# Patient Record
Sex: Female | Born: 1985 | Race: Black or African American | Hispanic: No | Marital: Married | Smoking: Current every day smoker
Health system: Southern US, Community
[De-identification: ages and names within clinical notes are randomized; demographics above are authoritative.]

## PROBLEM LIST (undated history)

## (undated) DIAGNOSIS — G809 Cerebral palsy, unspecified: Secondary | ICD-10-CM

## (undated) HISTORY — PX: OTHER SURGICAL HISTORY: SHX169

---
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
## 2004-08-23 ENCOUNTER — Emergency Department (HOSPITAL_COMMUNITY): Admission: EM | Admit: 2004-08-23 | Discharge: 2004-08-24 | Payer: Self-pay | Admitting: Emergency Medicine

## 2005-01-02 ENCOUNTER — Emergency Department (HOSPITAL_COMMUNITY): Admission: EM | Admit: 2005-01-02 | Discharge: 2005-01-02 | Payer: Self-pay | Admitting: Emergency Medicine

## 2005-03-22 ENCOUNTER — Ambulatory Visit: Payer: Self-pay | Admitting: Family Medicine

## 2005-04-22 ENCOUNTER — Ambulatory Visit (HOSPITAL_COMMUNITY): Admission: AD | Admit: 2005-04-22 | Discharge: 2005-04-22 | Payer: Self-pay | Admitting: Obstetrics & Gynecology

## 2005-04-22 ENCOUNTER — Ambulatory Visit: Payer: Self-pay | Admitting: Obstetrics & Gynecology

## 2005-04-22 ENCOUNTER — Encounter (INDEPENDENT_AMBULATORY_CARE_PROVIDER_SITE_OTHER): Payer: Self-pay | Admitting: Specialist

## 2005-04-24 ENCOUNTER — Inpatient Hospital Stay (HOSPITAL_COMMUNITY): Admission: AD | Admit: 2005-04-24 | Discharge: 2005-04-24 | Payer: Self-pay | Admitting: Obstetrics and Gynecology

## 2006-08-13 ENCOUNTER — Inpatient Hospital Stay (HOSPITAL_COMMUNITY): Admission: EM | Admit: 2006-08-13 | Discharge: 2006-08-18 | Payer: Self-pay | Admitting: Emergency Medicine

## 2006-08-13 ENCOUNTER — Ambulatory Visit: Payer: Self-pay | Admitting: Family Medicine

## 2006-10-04 ENCOUNTER — Inpatient Hospital Stay (HOSPITAL_COMMUNITY): Admission: AD | Admit: 2006-10-04 | Discharge: 2006-10-04 | Payer: Self-pay | Admitting: Obstetrics & Gynecology

## 2007-02-10 ENCOUNTER — Inpatient Hospital Stay (HOSPITAL_COMMUNITY): Admission: AD | Admit: 2007-02-10 | Discharge: 2007-02-10 | Payer: Self-pay | Admitting: Obstetrics and Gynecology

## 2007-04-06 ENCOUNTER — Emergency Department (HOSPITAL_COMMUNITY): Admission: EM | Admit: 2007-04-06 | Discharge: 2007-04-06 | Payer: Self-pay | Admitting: Family Medicine

## 2007-04-22 ENCOUNTER — Emergency Department (HOSPITAL_COMMUNITY): Admission: EM | Admit: 2007-04-22 | Discharge: 2007-04-22 | Payer: Self-pay | Admitting: Podiatry

## 2007-09-24 ENCOUNTER — Emergency Department (HOSPITAL_COMMUNITY): Admission: EM | Admit: 2007-09-24 | Discharge: 2007-09-24 | Payer: Self-pay | Admitting: Emergency Medicine

## 2007-11-03 ENCOUNTER — Emergency Department (HOSPITAL_COMMUNITY): Admission: EM | Admit: 2007-11-03 | Discharge: 2007-11-03 | Payer: Self-pay | Admitting: Emergency Medicine

## 2007-11-29 ENCOUNTER — Emergency Department (HOSPITAL_COMMUNITY): Admission: EM | Admit: 2007-11-29 | Discharge: 2007-11-29 | Payer: Self-pay | Admitting: Emergency Medicine

## 2008-07-04 ENCOUNTER — Emergency Department (HOSPITAL_COMMUNITY): Admission: EM | Admit: 2008-07-04 | Discharge: 2008-07-04 | Payer: Self-pay | Admitting: Emergency Medicine

## 2008-07-19 ENCOUNTER — Emergency Department (HOSPITAL_COMMUNITY): Admission: EM | Admit: 2008-07-19 | Discharge: 2008-07-19 | Payer: Self-pay | Admitting: Emergency Medicine

## 2008-10-03 ENCOUNTER — Emergency Department (HOSPITAL_COMMUNITY): Admission: EM | Admit: 2008-10-03 | Discharge: 2008-10-03 | Payer: Self-pay | Admitting: Emergency Medicine

## 2008-10-31 ENCOUNTER — Emergency Department (HOSPITAL_COMMUNITY): Admission: EM | Admit: 2008-10-31 | Discharge: 2008-10-31 | Payer: Self-pay | Admitting: Emergency Medicine

## 2009-03-13 ENCOUNTER — Emergency Department (HOSPITAL_COMMUNITY): Admission: EM | Admit: 2009-03-13 | Discharge: 2009-03-13 | Payer: Self-pay | Admitting: Emergency Medicine

## 2009-08-01 ENCOUNTER — Inpatient Hospital Stay (HOSPITAL_COMMUNITY): Admission: AD | Admit: 2009-08-01 | Discharge: 2009-08-01 | Payer: Self-pay | Admitting: Family Medicine

## 2009-08-02 ENCOUNTER — Inpatient Hospital Stay (HOSPITAL_COMMUNITY): Admission: AD | Admit: 2009-08-02 | Discharge: 2009-08-02 | Payer: Self-pay | Admitting: Obstetrics and Gynecology

## 2009-08-09 ENCOUNTER — Inpatient Hospital Stay (HOSPITAL_COMMUNITY): Admission: AD | Admit: 2009-08-09 | Discharge: 2009-08-09 | Payer: Self-pay | Admitting: Obstetrics & Gynecology

## 2009-10-09 ENCOUNTER — Ambulatory Visit (HOSPITAL_COMMUNITY): Admission: RE | Admit: 2009-10-09 | Discharge: 2009-10-09 | Payer: Self-pay | Admitting: Obstetrics

## 2010-02-07 ENCOUNTER — Inpatient Hospital Stay (HOSPITAL_COMMUNITY): Admission: AD | Admit: 2010-02-07 | Discharge: 2010-02-07 | Payer: Self-pay | Admitting: Obstetrics & Gynecology

## 2010-02-13 ENCOUNTER — Inpatient Hospital Stay (HOSPITAL_COMMUNITY): Admission: AD | Admit: 2010-02-13 | Discharge: 2010-02-14 | Payer: Self-pay | Admitting: Obstetrics & Gynecology

## 2010-02-15 ENCOUNTER — Encounter: Payer: Self-pay | Admitting: Obstetrics & Gynecology

## 2010-02-15 ENCOUNTER — Inpatient Hospital Stay (HOSPITAL_COMMUNITY): Admission: AD | Admit: 2010-02-15 | Discharge: 2010-02-18 | Payer: Self-pay | Admitting: Obstetrics & Gynecology

## 2010-03-25 ENCOUNTER — Emergency Department (HOSPITAL_COMMUNITY): Admission: EM | Admit: 2010-03-25 | Discharge: 2010-03-25 | Payer: Self-pay | Admitting: Family Medicine

## 2010-03-25 ENCOUNTER — Emergency Department (HOSPITAL_COMMUNITY): Admission: EM | Admit: 2010-03-25 | Discharge: 2010-03-26 | Payer: Self-pay | Admitting: Emergency Medicine

## 2010-03-27 ENCOUNTER — Inpatient Hospital Stay (HOSPITAL_COMMUNITY): Admission: AD | Admit: 2010-03-27 | Discharge: 2010-03-31 | Payer: Self-pay | Admitting: Obstetrics

## 2011-01-09 ENCOUNTER — Emergency Department (HOSPITAL_COMMUNITY)
Admission: EM | Admit: 2011-01-09 | Discharge: 2011-01-09 | Payer: Self-pay | Source: Home / Self Care | Admitting: Emergency Medicine

## 2011-01-09 LAB — POCT PREGNANCY, URINE: Preg Test, Ur: NEGATIVE

## 2011-03-06 LAB — COMPREHENSIVE METABOLIC PANEL
Albumin: 3.7 g/dL (ref 3.5–5.2)
Alkaline Phosphatase: 68 U/L (ref 39–117)
BUN: 12 mg/dL (ref 6–23)
BUN: 12 mg/dL (ref 6–23)
CO2: 22 mEq/L (ref 19–32)
Calcium: 8.8 mg/dL (ref 8.4–10.5)
Chloride: 107 mEq/L (ref 96–112)
Creatinine, Ser: 0.54 mg/dL (ref 0.4–1.2)
Creatinine, Ser: 0.73 mg/dL (ref 0.4–1.2)
GFR calc non Af Amer: >60 mL/min (ref >60)
Glucose, Bld: 101 mg/dL — ABNORMAL HIGH (ref 70–99)
Potassium: 2.8 mEq/L — ABNORMAL LOW (ref 3.5–5.1)
Potassium: 3.3 mEq/L — ABNORMAL LOW (ref 3.5–5.1)
Total Bilirubin: 0.7 mg/dL (ref 0.3–1.2)
Total Protein: 6.8 g/dL (ref 6.0–8.3)

## 2011-03-06 LAB — CBC
HCT: 34.2 % — ABNORMAL LOW (ref 36.0–46.0)
HCT: 35.8 % — ABNORMAL LOW (ref 36.0–46.0)
Hemoglobin: 11.2 g/dL — ABNORMAL LOW (ref 12.0–15.0)
Hemoglobin: 11.3 g/dL — ABNORMAL LOW (ref 12.0–15.0)
MCHC: 32.7 g/dL (ref 30.0–36.0)
MCHC: 32.5 g/dL (ref 30.0–36.0)
MCV: 93.7 fL (ref 78.0–100.0)
MCV: 94.1 fL (ref 78.0–100.0)
MCV: 94.3 fL (ref 78.0–100.0)
MCV: 96.4 fL (ref 78.0–100.0)
Platelets: 152 10*3/uL (ref 150–400)
Platelets: 172 10*3/uL (ref 150–400)
Platelets: 183 10*3/uL (ref 150–400)
RBC: 3.32 MIL/uL — ABNORMAL LOW (ref 3.87–5.11)
RBC: 3.61 MIL/uL — ABNORMAL LOW (ref 3.87–5.11)
RDW: 12.5 % (ref 11.5–15.5)
WBC: 22.1 10*3/uL — ABNORMAL HIGH (ref 4.0–10.5)
WBC: 8 10*3/uL (ref 4.0–10.5)
WBC: 8.4 10*3/uL (ref 4.0–10.5)

## 2011-03-06 LAB — DIFFERENTIAL
Basophils Relative: 0 % (ref 0–1)
Basophils Relative: 0 % (ref 0–1)
Eosinophils Absolute: 0.2 10*3/uL (ref 0.0–0.7)
Eosinophils Relative: 0 % (ref 0–5)
Lymphocytes Relative: 4 % — ABNORMAL LOW (ref 12–46)
Lymphs Abs: 0.3 10*3/uL — ABNORMAL LOW (ref 0.7–4.0)
Lymphs Abs: 0.7 10*3/uL (ref 0.7–4.0)
Lymphs Abs: 1.7 10*3/uL (ref 0.7–4.0)
Monocytes Absolute: 0 10*3/uL — ABNORMAL LOW (ref 0.1–1.0)
Monocytes Absolute: 0.4 10*3/uL (ref 0.1–1.0)
Monocytes Relative: 1 % — ABNORMAL LOW (ref 3–12)
Monocytes Relative: 2 % — ABNORMAL LOW (ref 3–12)
Monocytes Relative: 8 % (ref 3–12)
Neutro Abs: 21 10*3/uL — ABNORMAL HIGH (ref 1.7–7.7)
Neutro Abs: 5.4 10*3/uL (ref 1.7–7.7)
Neutro Abs: 7.3 10*3/uL (ref 1.7–7.7)
Neutrophils Relative %: 67 % (ref 43–77)

## 2011-03-06 LAB — URINALYSIS, ROUTINE W REFLEX MICROSCOPIC
Glucose, UA: NEGATIVE mg/dL
Specific Gravity, Urine: 1.029 (ref 1.005–1.030)
Urobilinogen, UA: 1 mg/dL (ref 0.0–1.0)

## 2011-03-06 LAB — WET PREP, GENITAL

## 2011-03-06 LAB — URINE MICROSCOPIC-ADD ON

## 2011-03-06 LAB — RPR: RPR Ser Ql: NONREACTIVE

## 2011-03-06 LAB — POCT PREGNANCY, URINE: Preg Test, Ur: NEGATIVE

## 2011-03-10 LAB — CBC
HCT: 29.7 % — ABNORMAL LOW (ref 36.0–46.0)
HCT: 33.6 % — ABNORMAL LOW (ref 36.0–46.0)
Hemoglobin: 11 g/dL — ABNORMAL LOW (ref 12.0–15.0)
Hemoglobin: 11.3 g/dL — ABNORMAL LOW (ref 12.0–15.0)
MCHC: 32.8 g/dL (ref 30.0–36.0)
MCHC: 33.3 g/dL (ref 30.0–36.0)
MCV: 95.8 fL (ref 78.0–100.0)
MCV: 95.9 fL (ref 78.0–100.0)
MCV: 97.2 fL (ref 78.0–100.0)
Platelets: 103 10*3/uL — ABNORMAL LOW (ref 150–400)
RBC: 3.55 MIL/uL — ABNORMAL LOW (ref 3.87–5.11)
RDW: 13.7 % (ref 11.5–15.5)
RDW: 13.8 % (ref 11.5–15.5)
WBC: 10.7 10*3/uL — ABNORMAL HIGH (ref 4.0–10.5)

## 2011-03-23 LAB — CBC
HCT: 34.8 % — ABNORMAL LOW (ref 36.0–46.0)
HCT: 34.8 % — ABNORMAL LOW (ref 36.0–46.0)
MCV: 93.2 fL (ref 78.0–100.0)
Platelets: 179 10*3/uL (ref 150–400)
Platelets: 193 10*3/uL (ref 150–400)
RBC: 3.74 MIL/uL — ABNORMAL LOW (ref 3.87–5.11)
RDW: 13 % (ref 11.5–15.5)
WBC: 6.4 10*3/uL (ref 4.0–10.5)
WBC: 7.9 10*3/uL (ref 4.0–10.5)

## 2011-03-23 LAB — COMPREHENSIVE METABOLIC PANEL
AST: 21 U/L (ref 0–37)
Albumin: 4 g/dL (ref 3.5–5.2)
Alkaline Phosphatase: 37 U/L — ABNORMAL LOW (ref 39–117)
BUN: 13 mg/dL (ref 6–23)
CO2: 23 mEq/L (ref 19–32)
Chloride: 105 mEq/L (ref 96–112)
Potassium: 4 mEq/L (ref 3.5–5.1)
Total Bilirubin: 0.4 mg/dL (ref 0.3–1.2)

## 2011-03-23 LAB — URINALYSIS, ROUTINE W REFLEX MICROSCOPIC
Bilirubin Urine: NEGATIVE
Glucose, UA: NEGATIVE mg/dL
Hgb urine dipstick: NEGATIVE
Hgb urine dipstick: NEGATIVE
Ketones, ur: 40 mg/dL — AB
Ketones, ur: >80 mg/dL — AB
Protein, ur: NEGATIVE mg/dL
Protein, ur: NEGATIVE mg/dL
Urobilinogen, UA: 1 mg/dL (ref 0.0–1.0)

## 2011-03-23 LAB — POCT PREGNANCY, URINE: Preg Test, Ur: POSITIVE

## 2011-03-23 LAB — URINE MICROSCOPIC-ADD ON

## 2011-03-23 LAB — URINE CULTURE: Colony Count: >=100000

## 2011-03-23 LAB — AMYLASE: Amylase: 131 U/L (ref 27–131)

## 2011-03-23 LAB — HCG, QUANTITATIVE, PREGNANCY: hCG, Beta Chain, Quant, S: 102764 m[IU]/mL — ABNORMAL HIGH (ref <5)

## 2011-05-03 NOTE — Discharge Summary (Signed)
NAMETIMERA, WINDT NO.:  [id number]   MEDICAL RECORD NO.:  [id number]          PATIENT TYPE:  INP   LOCATION:  5004                         FACILITY:  [facility]   PHYSICIAN:  Melina Fiddler, MD DATE OF BIRTH:  March 30, 1986   DATE OF ADMISSION:  08/13/2006  DATE OF DISCHARGE:                                 DISCHARGE SUMMARY   CONSULTS:  GYN.   DISCHARGE DIAGNOSES:  1. Gonorrheal pelvic inflammatory disease.  2. Anemia of blood loss.  3. Constipation.   PROCEDURES:  Ultrasound on August 13, 2006, showing complex fluid 5 x 4 x 10  cm collection in pelvis.  CT showed small amount of free fluid in the  pelvis.   IMPORTANT LABORATORY RESULTS:  White count 6.7 at discharge, up to 18.1  during the admission.  Hemoglobin 10.6 at discharge.  Creatinine 0.7 at  discharge.  CRP up to 207.3.  HIV nonreactive.  RPR nonreactive.  GC  positive.  Few clue cells on wet prep.  Urine pregnancy negative.   HOSPITAL COURSE:  A 25 year old [place] American female who presented with  abdominal pain and copious vaginal discharge, found to have gonorrheal PID.  There was a fluid collection found on ultrasound that was worrisome for an  abscess so a CT was performed and found that there was only a small mild  fluid not amenable to drainage.  We did consult OB/GYN who agreed there was  no need for surgical intervention.  She was treated with ceftriaxone  initially, followed by p.o. doxycycline and Flagyl.  She also had some clue  cells on her wet prep.  She was given a Depo-Provera shot in the hospital,  was instructed to follow up in 3 months for repeat.  Also, to wear condoms  and have her sexual partners tested for STDs.   DISCHARGE CONDITION:  Good.   DISCHARGE DIET:  Regular.   DISCHARGE ACTIVITY:  As tolerated.   FOLLOWUP:  The patient will call for followup with Dr. Sylvan Cheese at the  [facility].  As stated above, she is instructed to  have her  sexual partners tested for STDs.  She is also to schedule with the  [facility] for her next Depo-Provera shot.   DISCHARGE MEDICATIONS:  1. Flagyl 500 mg p.o. b.i.d. x14 days.  2. Doxycycline 100 mg p.o. b.i.d. x14 days.  3. Vicodin 5/500 one to two tablets by mouth every 6 hours as needed for      pain.  4. MiraLax as needed for constipation.      Angeline Slim, M.D.    ______________________________  Melina Fiddler, MD    AL/MEDQ  D:  08/18/2006  T:  08/18/2006  Job:  [id number]   cc:   [facility]  Sylvan Cheese, M.D.

## 2011-05-03 NOTE — Op Note (Signed)
NAME:  Gina Riley, Gina Riley NO.:  [id number]   MEDICAL RECORD NO.:  [id number]          PATIENT TYPE:  AMB   LOCATION:  [facility]                          FACILITY:  [facility]   PHYSICIAN:  Lesly Dukes, M.D. DATE OF BIRTH:  1986/10/21   DATE OF PROCEDURE:  04/22/2005  DATE OF DISCHARGE:                                 OPERATIVE REPORT   INDICATIONS FOR PROCEDURE:  The patient is a 25 year old G1, P0, with a beta  of approximately 10,000 and questionable incomplete AB versus left ectopic.  The patient's exam is benign and cervix is slightly effaced and dilated.  This is, most likely an incomplete.  We will proceed with D&C with frozen  section.  If frozen section is not consistent with products of conception,  will proceed with ectopic protocol.   PREOPERATIVE DIAGNOSIS:  25 year old G1, P0 female with probable incomplete  abortion.   POSTOPERATIVE DIAGNOSIS:  25 year old G1, P0 female with probable incomplete  abortion.   PROCEDURE:  Dilation and vacuum curettage with frozen section.   SURGEON:  Lesly Dukes, M.D.   ANESTHESIA:  MAC plus local.   SPECIMENS:  POC.   ESTIMATED BLOOD LOSS:  100 mL.   COMPLICATIONS:  None.   DESCRIPTION OF PROCEDURE:  After informed consent was obtained, the patient  was taken to the operating room where MAC anesthesia was found to be  adequate.  The patient was placed in the dorsal lithotomy position  and  prepped and draped in the normal sterile fashion.  The bladder was in and  out catheterized.  A bivalve speculum was placed into the vagina and the  cervix was brought into view.  The anterior lip of the cervix was grasped  with a single tooth tenaculum.  20 mL of 1% lidocaine were injected at 3 and  9 o'clock.  The cervix was gently dilated with Hegar dilators to a #9.  A #8  curved suction curet was gently introduced into the uterus and suction  curettage was performed.  The suction curet was removed and a sharp curet  was  introduced into the uterus.  Gentle sharp curettage was performed and a  good crie was felt on all four sides of the uterus.  The sharp curet was  removed and one last pass with the suction curet was done to insure that all  POC was removed from the uterus.  All instruments were removed from the  vagina.  The cervix was hemostatic.  The patient tolerated the procedure  well.  Sponge, lap, instrument, and needle counts were correct x 2.  The  patient went to the recovery room in stable condition.     KHL/MEDQ  D:  04/22/2005  T:  04/22/2005  Job:  [id number]

## 2011-07-09 ENCOUNTER — Emergency Department (HOSPITAL_COMMUNITY)
Admission: EM | Admit: 2011-07-09 | Discharge: 2011-07-09 | Discharge status: Against Medical Advice | Payer: Medicaid Other | Attending: Emergency Medicine | Admitting: Emergency Medicine

## 2011-07-09 DIAGNOSIS — Z0389 Encounter for observation for other suspected diseases and conditions ruled out: Secondary | ICD-10-CM | POA: Insufficient documentation

## 2011-09-24 LAB — POCT PREGNANCY, URINE: Operator id: 198171

## 2011-09-26 LAB — URINALYSIS, ROUTINE W REFLEX MICROSCOPIC
Bilirubin Urine: NEGATIVE
Glucose, UA: NEGATIVE
Hgb urine dipstick: NEGATIVE
Specific Gravity, Urine: 1.023
Urobilinogen, UA: 1

## 2011-09-26 LAB — URINE MICROSCOPIC-ADD ON

## 2011-09-26 LAB — RPR: RPR Ser Ql: NONREACTIVE

## 2012-04-27 DIAGNOSIS — M9420 Chondromalacia, unspecified site: Secondary | ICD-10-CM | POA: Diagnosis not present

## 2012-07-13 DIAGNOSIS — Z113 Encounter for screening for infections with a predominantly sexual mode of transmission: Secondary | ICD-10-CM | POA: Diagnosis not present

## 2012-07-13 DIAGNOSIS — Z3202 Encounter for pregnancy test, result negative: Secondary | ICD-10-CM | POA: Diagnosis not present

## 2012-07-13 DIAGNOSIS — N760 Acute vaginitis: Secondary | ICD-10-CM | POA: Diagnosis not present

## 2012-12-08 ENCOUNTER — Encounter (HOSPITAL_COMMUNITY): Payer: Self-pay | Admitting: *Deleted

## 2012-12-08 ENCOUNTER — Emergency Department (INDEPENDENT_AMBULATORY_CARE_PROVIDER_SITE_OTHER): Payer: Medicare Other

## 2012-12-08 ENCOUNTER — Emergency Department (INDEPENDENT_AMBULATORY_CARE_PROVIDER_SITE_OTHER)
Admission: EM | Admit: 2012-12-08 | Discharge: 2012-12-08 | Disposition: A | Discharge status: Home / Self Care | Payer: Medicare Other | Source: Home / Self Care | Attending: Emergency Medicine | Admitting: Emergency Medicine

## 2012-12-08 DIAGNOSIS — R8290 Unspecified abnormal findings in urine: Secondary | ICD-10-CM

## 2012-12-08 DIAGNOSIS — R109 Unspecified abdominal pain: Secondary | ICD-10-CM

## 2012-12-08 DIAGNOSIS — K5900 Constipation, unspecified: Secondary | ICD-10-CM | POA: Diagnosis not present

## 2012-12-08 DIAGNOSIS — R82998 Other abnormal findings in urine: Secondary | ICD-10-CM

## 2012-12-08 HISTORY — DX: Cerebral palsy, unspecified: G80.9

## 2012-12-08 LAB — POCT URINALYSIS DIP (DEVICE)
Glucose, UA: NEGATIVE mg/dL
Nitrite: POSITIVE — AB
Protein, ur: 30 mg/dL — AB
Urobilinogen, UA: 0.2 mg/dL (ref 0.0–1.0)

## 2012-12-08 LAB — POCT PREGNANCY, URINE: Preg Test, Ur: NEGATIVE

## 2012-12-08 MED ORDER — SULFAMETHOXAZOLE-TRIMETHOPRIM 800-160 MG PO TABS: 1.0000 | ORAL_TABLET | Freq: Two times a day (BID) | ORAL | Status: DC

## 2012-12-08 NOTE — ED Notes (Signed)
Pt  Reports  l  Side  abd       X  2  Weeks    Also late  On  Her  Period      denys  Any bleeding or  Discharge

## 2012-12-08 NOTE — ED Provider Notes (Signed)
History     CSN: [id number]  Arrival date & time 12/08/12  1444   First MD Initiated Contact with Patient 12/08/12 1524      Chief Complaint  Patient presents with  . Abdominal Cramping    (Consider location/radiation/quality/duration/timing/severity/associated sxs/prior treatment) HPI Comments: Patient presents urgent care describing for about 2 weeks she's been having discomfort and cramping pains in left side of her abdomen. (Patient points to mid abdominal region and suprapubic region), she denies any vaginal bleeding or discharge or urinary symptoms. She said that about a week ago she vomited 2x1 day. But that has subsided and she has not been nauseous since then or have had any other further episodes of vomiting. She denies any fevers, describes that she still has an appetite. But she has not had a bowel movement for about 5-6 days. She actually took an over-the-counter laxative for it did help some. Patient denies any respiratory symptoms, and she was also wondering if she's pregnant she has not seen her. Within the last 2 weeks she has been expecting her period.  Patient is a 26 y.o. female presenting with cramps. The history is provided by the patient.  Abdominal Cramping The primary symptoms of the illness include abdominal pain. The primary symptoms of the illness do not include fever, shortness of breath, nausea, diarrhea, dysuria, vaginal discharge or vaginal bleeding. The current episode started more than 2 days ago. The problem has not changed since onset. Additional symptoms associated with the illness include constipation. Symptoms associated with the illness do not include chills, anorexia, urgency, hematuria, frequency or back pain. Significant associated medical issues do not include inflammatory bowel disease, sickle cell disease, substance abuse or diverticulitis.    Past Medical History  Diagnosis Date  . Cerebral palsy     History reviewed. No pertinent past  surgical history.  No family history on file.  History  Substance Use Topics  . Smoking status: Current Every Day Smoker  . Smokeless tobacco: Not on file  . Alcohol Use: No    OB History    Grav Para Term Preterm Abortions TAB SAB Ect Mult Living                  Review of Systems  Constitutional: Negative for fever, chills, activity change and appetite change.  Respiratory: Negative for cough and shortness of breath.   Gastrointestinal: Positive for abdominal pain and constipation. Negative for nausea, diarrhea, blood in stool, abdominal distention, anal bleeding, rectal pain and anorexia.  Genitourinary: Negative for dysuria, urgency, frequency, hematuria, vaginal bleeding and vaginal discharge.  Musculoskeletal: Negative for back pain.  Skin: Negative for rash.  Neurological: Negative for dizziness, syncope and light-headedness.    Allergies  Codeine and Penicillins  Home Medications   Current Outpatient Rx  Name  Route  Sig  Dispense  Refill  . SULFAMETHOXAZOLE-TRIMETHOPRIM 800-160 MG PO TABS   Oral   Take 1 tablet by mouth 2 (two) times daily.   28 tablet   0     BP 118/68  Pulse 71  Temp 98.2 F (36.8 C) (Oral)  Resp 18  SpO2 100%  LMP 11/27/2012  Physical Exam  Nursing note and vitals reviewed. Constitutional: She appears well-developed and well-nourished. No distress.  HENT:  Head: Normocephalic.  Eyes: Conjunctivae normal are normal.  Neck: Neck supple.  Cardiovascular: Normal rate.  Exam reveals no gallop and no friction rub.   No murmur heard. Pulmonary/Chest: Effort normal and breath sounds normal.  Abdominal: Soft. Bowel sounds are normal. She exhibits no distension and no mass. There is no hepatosplenomegaly, splenomegaly or hepatomegaly. There is tenderness. There is no rigidity, no rebound, no guarding, no CVA tenderness, no tenderness at McBurney's point and negative Murphy's sign. No hernia. Hernia confirmed negative in the ventral area.      Lymphadenopathy:    She has no cervical adenopathy.  Skin: No erythema.    ED Course  Procedures (including critical care time)  Labs Reviewed  POCT URINALYSIS DIP (DEVICE) - Abnormal; Notable for the following:    Hgb urine dipstick TRACE (*)     Protein, ur 30 (*)     Nitrite POSITIVE (*)     Leukocytes, UA SMALL (*)  Biochemical Testing Only. Please order routine urinalysis from main lab if confirmatory testing is needed.   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  Scattered air and stool throughout the bowel.  Stool burden appears within normal limits.  Negative for obstruction or dilatation.  Stable pelvic calcifications consistent with venous phlebolith.  No acute osseous finding.  IMPRESSION: Normal bowel gas pattern.  Stable exam.   Original Report Authenticated By: Judie Petit. Shick, M.D.      1. Abdominal pain   2. Abnormal urine       MDM  Patient with nonspecific abdominal discomfort. Soft abdomen with no peritoneal reaction or defensive. KUB showed mild constipation, patient also describes have not had a bowel movement for more than 5 days. Patient looks comfortable, afebrile. And denies urinary symptoms but have had some pressure sensation in her suprapubic area. She is also a menorrhagia for 2 weeks. We'll start patient on Bactrim absent urine for cultures and have advised patient to followup in the emergency department if symptoms don't resolve or worsening symptoms. She understands and agrees with treatment plan and followup care as necessary. Have also advised her to followup with her primary care Dr. early next week if this discomfort persist or if her symptoms have not faded away  despite treatment.        Jimmie Molly, MD 12/08/12 [phone]

## 2012-12-11 LAB — URINE CULTURE: Colony Count: >=100000

## 2012-12-13 ENCOUNTER — Emergency Department (HOSPITAL_COMMUNITY)
Admission: EM | Admit: 2012-12-13 | Discharge: 2012-12-13 | Disposition: A | Discharge status: Home Health | Payer: Medicare Other | Attending: Emergency Medicine | Admitting: Emergency Medicine

## 2012-12-13 ENCOUNTER — Encounter (HOSPITAL_COMMUNITY): Payer: Self-pay | Admitting: Emergency Medicine

## 2012-12-13 ENCOUNTER — Emergency Department (HOSPITAL_COMMUNITY): Payer: Medicare Other

## 2012-12-13 DIAGNOSIS — N949 Unspecified condition associated with female genital organs and menstrual cycle: Secondary | ICD-10-CM | POA: Insufficient documentation

## 2012-12-13 DIAGNOSIS — K5900 Constipation, unspecified: Secondary | ICD-10-CM | POA: Diagnosis not present

## 2012-12-13 DIAGNOSIS — G809 Cerebral palsy, unspecified: Secondary | ICD-10-CM | POA: Diagnosis not present

## 2012-12-13 DIAGNOSIS — R0602 Shortness of breath: Secondary | ICD-10-CM | POA: Diagnosis not present

## 2012-12-13 DIAGNOSIS — Z3202 Encounter for pregnancy test, result negative: Secondary | ICD-10-CM | POA: Diagnosis not present

## 2012-12-13 DIAGNOSIS — N159 Renal tubulo-interstitial disease, unspecified: Secondary | ICD-10-CM | POA: Diagnosis not present

## 2012-12-13 DIAGNOSIS — R079 Chest pain, unspecified: Secondary | ICD-10-CM | POA: Diagnosis not present

## 2012-12-13 DIAGNOSIS — F17200 Nicotine dependence, unspecified, uncomplicated: Secondary | ICD-10-CM | POA: Insufficient documentation

## 2012-12-13 DIAGNOSIS — R102 Pelvic and perineal pain: Secondary | ICD-10-CM

## 2012-12-13 DIAGNOSIS — N739 Female pelvic inflammatory disease, unspecified: Secondary | ICD-10-CM

## 2012-12-13 DIAGNOSIS — R109 Unspecified abdominal pain: Secondary | ICD-10-CM | POA: Diagnosis not present

## 2012-12-13 DIAGNOSIS — N898 Other specified noninflammatory disorders of vagina: Secondary | ICD-10-CM | POA: Diagnosis not present

## 2012-12-13 LAB — WET PREP, GENITAL
Trich, Wet Prep: NONE SEEN
Yeast Wet Prep HPF POC: NONE SEEN

## 2012-12-13 LAB — URINALYSIS, ROUTINE W REFLEX MICROSCOPIC
Glucose, UA: NEGATIVE mg/dL
Ketones, ur: 40 mg/dL — AB
Leukocytes, UA: NEGATIVE
Nitrite: NEGATIVE
Specific Gravity, Urine: 1.022 (ref 1.005–1.030)
pH: 7 (ref 5.0–8.0)

## 2012-12-13 LAB — COMPREHENSIVE METABOLIC PANEL
AST: 24 U/L (ref 0–37)
Albumin: 3.6 g/dL (ref 3.5–5.2)
Alkaline Phosphatase: 51 U/L (ref 39–117)
CO2: 18 mEq/L — ABNORMAL LOW (ref 19–32)
Chloride: 105 mEq/L (ref 96–112)
GFR calc non Af Amer: >90 mL/min (ref >90)
Potassium: 3.6 mEq/L (ref 3.5–5.1)
Total Bilirubin: 0.3 mg/dL (ref 0.3–1.2)

## 2012-12-13 LAB — CBC WITH DIFFERENTIAL/PLATELET
Basophils Absolute: 0 10*3/uL (ref 0.0–0.1)
Basophils Relative: 0 % (ref 0–1)
Hemoglobin: 11.1 g/dL — ABNORMAL LOW (ref 12.0–15.0)
Lymphocytes Relative: 22 % (ref 12–46)
MCHC: 33.3 g/dL (ref 30.0–36.0)
Monocytes Relative: 10 % (ref 3–12)
Neutro Abs: 4 10*3/uL (ref 1.7–7.7)
Neutrophils Relative %: 65 % (ref 43–77)
WBC: 6.2 10*3/uL (ref 4.0–10.5)

## 2012-12-13 MED ORDER — POLYETHYLENE GLYCOL 3350 17 GM/SCOOP PO POWD: 17.0000 g | Freq: Two times a day (BID) | ORAL | Status: DC

## 2012-12-13 MED ORDER — IBUPROFEN 800 MG PO TABS
800.0000 mg | ORAL_TABLET | Freq: Once | ORAL | Status: AC
  Administered 2012-12-13: 800 mg via ORAL
  Filled 2012-12-13: qty 1

## 2012-12-13 MED ORDER — AZITHROMYCIN 1 G PO PACK
1.0000 g | PACK | Freq: Once | ORAL | Status: AC
  Administered 2012-12-13: 1 g via ORAL
  Filled 2012-12-13: qty 1

## 2012-12-13 MED ORDER — DICYCLOMINE HCL 10 MG PO CAPS
10.0000 mg | ORAL_CAPSULE | Freq: Once | ORAL | Status: AC
  Administered 2012-12-13: 10 mg via ORAL
  Filled 2012-12-13: qty 1

## 2012-12-13 MED ORDER — DOCUSATE SODIUM 100 MG PO CAPS: 100.0000 mg | ORAL_CAPSULE | Freq: Two times a day (BID) | ORAL | Status: DC

## 2012-12-13 MED ORDER — CEFTRIAXONE SODIUM 250 MG IJ SOLR
250.0000 mg | Freq: Once | INTRAMUSCULAR | Status: AC
  Administered 2012-12-13: 250 mg via INTRAMUSCULAR
  Filled 2012-12-13: qty 250

## 2012-12-13 MED ORDER — MILK AND MOLASSES ENEMA
Freq: Once | RECTAL | Status: AC
  Administered 2012-12-13: 08:00:00 via RECTAL
  Filled 2012-12-13: qty 250

## 2012-12-13 NOTE — ED Provider Notes (Signed)
History     CSN: [id number]  Arrival date & time 12/13/12  3086   First MD Initiated Contact with Patient 12/13/12 0533      Chief Complaint  Patient presents with  . Abdominal Pain  . Flank Pain    (Consider location/radiation/quality/duration/timing/severity/associated sxs/prior treatment) HPIKatrice D Riley is a 26 y.o. female presents to the emergency department with left lower quadrant pain. Patient was seen in urgent care yesterday and was told she had a "kidney infection." Patient describes her pain as achy, sharp, and the left lower quadrant and occasionally in the left part of her back, is intermittent and more constant now. She denies any nausea vomiting or diarrhea, she had some chills yesterday.  She did have some small amount of yellowish vaginal discharge yesterday but no vaginal bleeding.  Pregnancy test done at urgent care was negative.  Her chest pain or shortness of breath, no melena or hematochezia.   Past Medical History  Diagnosis Date  . Cerebral palsy     History reviewed. No pertinent past surgical history.  No family history on file.  History  Substance Use Topics  . Smoking status: Current Every Day Smoker  . Smokeless tobacco: Not on file  . Alcohol Use: No    OB History    Grav Para Term Preterm Abortions TAB SAB Ect Mult Living                  Review of Systems At least 10pt or greater review of systems completed and are negative except where specified in the HPI.  Allergies  Codeine and Penicillins  Home Medications   Current Outpatient Rx  Name  Route  Sig  Dispense  Refill  . SULFAMETHOXAZOLE-TRIMETHOPRIM 800-160 MG PO TABS   Oral   Take 1 tablet by mouth 2 (two) times daily.   28 tablet   0     BP 114/71  Temp 98 F (36.7 C)  Resp 18  SpO2 100%  LMP 11/27/2012  Physical Exam  Nursing notes reviewed.  Electronic medical record reviewed. VITAL SIGNS:   Filed Vitals:   12/13/12 0450 12/13/12 0630  BP: 114/71  110/66  Pulse:  78  Temp: 98 F (36.7 C)   Resp: 18   SpO2: 100% 100%   CONSTITUTIONAL: Awake, oriented, appears non-toxic HENT: Atraumatic, normocephalic, oral mucosa pink and moist, airway patent. Nares patent without drainage. External ears normal. EYES: Conjunctiva clear, EOMI, PERRLA NECK: Trachea midline, non-tender, supple CARDIOVASCULAR: Normal heart rate, Normal rhythm, No murmurs, rubs, gallops PULMONARY/CHEST: Clear to auscultation, no rhonchi, wheezes, or rales. Symmetrical breath sounds. Non-tender. ABDOMINAL: Non-distended, soft, nontender palpation the left lower quadrant non-tender - no rebound or guarding.  BS normal. Mild tenderness to percussion the left flank NEUROLOGIC: Non-focal, moving all four extremities, no gross sensory or motor deficits. EXTREMITIES: No clubbing, cyanosis, or edema SKIN: Warm, Dry, No erythema, No rash PELVIC EXAM: normal external genitalia, vulva, vagina has a moderate amount of frothy white-yellow discharge in the vault, cervix has mild tenderness to palpation which is also worse in the left adnexa ED Course  Procedures (including critical care time)  Labs Reviewed  URINALYSIS, ROUTINE W REFLEX MICROSCOPIC - Abnormal; Notable for the following:    Ketones, ur 40 (*)     All other components within normal limits  POCT PREGNANCY, URINE  CBC WITH DIFFERENTIAL  COMPREHENSIVE METABOLIC PANEL  LIPASE, BLOOD   Dg Abd 2 Views  12/13/2012  *RADIOLOGY REPORT*  Clinical Data:  Left-sided flank pain.  ABDOMEN - 2 VIEW  Comparison: Abdominal radiograph performed 12/08/2012  Findings: The visualized bowel gas pattern is unremarkable. Scattered air and stool filled loops of colon are seen; no abnormal dilatation of small bowel loops is seen to suggest small bowel obstruction.  No free intra-abdominal air is identified on the provided upright view.  The visualized osseous structures are within normal limits; the sacroiliac joints are unremarkable in  appearance.  The visualized lung bases are essentially clear.  There is mild elevation of the left hemidiaphragm.  IMPRESSION: Unremarkable bowel gas pattern; no free intra-abdominal air seen.   Original Report Authenticated By: Tonia Ghent, M.D.      1. Constipation   2. Pelvic pain   3. Pelvic inflammatory disease       MDM  Gina Riley is a 26 y.o. female presenting with left lower quadrant pain the day after she presented to urgent care. I do not think the patient has a urinary tract infection-there is no UTI suggested on urinalysis. Patient's pelvic exam, which was not done previously, is suggestive of PID, she has pain when the left adnexa are palpated. She's not pregnant. She has a significant discharge.  On x-ray of her abdomen and pelvis, radiology reads unremarkable bowel gas pattern - which I agree with, there are what appear to be small hard stools throughout the colon-will encourage them to be evacuated with a enema. We'll treat for PID.  Patiently discharge stable home in good condition.  I explained the diagnosis and have given explicit precautions to return to the ER including any other new or worsening symptoms. The patient understands and accepts the medical plan as it's been dictated and I have answered their questions. Discharge instructions concerning home care and prescriptions have been given.  The patient is STABLE and is discharged to home in good condition.          Jones Skene, MD 12/13/12 [phone]

## 2012-12-13 NOTE — ED Notes (Signed)
Pt discharged to home with family. NAD.  

## 2012-12-13 NOTE — ED Notes (Signed)
Patient transported to X-ray 

## 2012-12-13 NOTE — ED Notes (Signed)
EMS called to house tonight due to worsening abdominal pain and left flank pain. Pt. Was seen at Urgent care on Tuesday and treated for kidney infection.

## 2012-12-14 LAB — GC/CHLAMYDIA PROBE AMP: CT Probe RNA: NEGATIVE

## 2012-12-22 NOTE — ED Notes (Signed)
Urine culture: > 100,000 colonies E. Coli. Pt. Adequately treated with Septra.   Gina Riley 12/22/2012

## 2013-03-06 ENCOUNTER — Inpatient Hospital Stay (HOSPITAL_COMMUNITY)
Admission: AD | Admit: 2013-03-06 | Discharge: 2013-03-06 | Disposition: A | Discharge status: Home / Self Care | Payer: Medicare Other | Source: Ambulatory Visit | Attending: Obstetrics | Admitting: Obstetrics

## 2013-03-06 DIAGNOSIS — N912 Amenorrhea, unspecified: Secondary | ICD-10-CM | POA: Diagnosis not present

## 2013-03-06 DIAGNOSIS — Z3202 Encounter for pregnancy test, result negative: Secondary | ICD-10-CM | POA: Diagnosis not present

## 2013-03-06 NOTE — MAU Provider Note (Signed)
Ms. Gina Riley is a 27 y.o. female who presents to MAU today for a pregnancy test. The patient denies any problems today. LMP 02/02/13.   BP 128/70  Pulse 69  Temp(Src) 97.7 F (36.5 C)  Resp 20  Ht 5\' 3"  (1.6 m)  Wt 116 lb 12.8 oz (52.98 kg)  BMI 20.7 kg/m2  SpO2 100%  LMP 02/02/2013 GENERAL: Well-developed, well-nourished female in no acute distress.  HEENT: Normocephalic, atraumatic. LUNGS:Normal effort HEART: Regular rate  SKIN: Warm, dry and without erythema  Results for orders placed during the hospital encounter of 03/06/13 (from the past 24 hour(s))  POCT PREGNANCY, URINE     Status: None   Collection Time    03/06/13  8:03 PM      Result Value Range   Preg Test, Ur NEGATIVE  NEGATIVE   A: Amenorrhea  P: Discharge home Patient encouraged to take HPT if still no period in 1 week. If positive establish prenatal care. If negative follow-up with Dr. Clearance Coots for further evaluation.  Patient may return to MAU as needed or if her condition were to change or worse  Freddi Starr, PA-C 03/06/2013 8:18 PM

## 2013-03-06 NOTE — Progress Notes (Signed)
Joseph Berkshire PA in to give upt results and pt d/c home

## 2013-03-06 NOTE — MAU Note (Signed)
Just want to find out if I'm pregnant. No problems currently

## 2013-03-06 NOTE — MAU Note (Signed)
Joseph Berkshire PA notified of neg upt. PA will talk with pt in Triage and send home.

## 2013-08-02 IMAGING — CR DG ABDOMEN 1V
1 series · 1 of 1 positions shown · non-contrast
Comparison: 11/03/2007, 03/26/2010

CLINICAL DATA: Lower abdominal pain, constipation

ABDOMEN - 1 VIEW

[view not recorded]
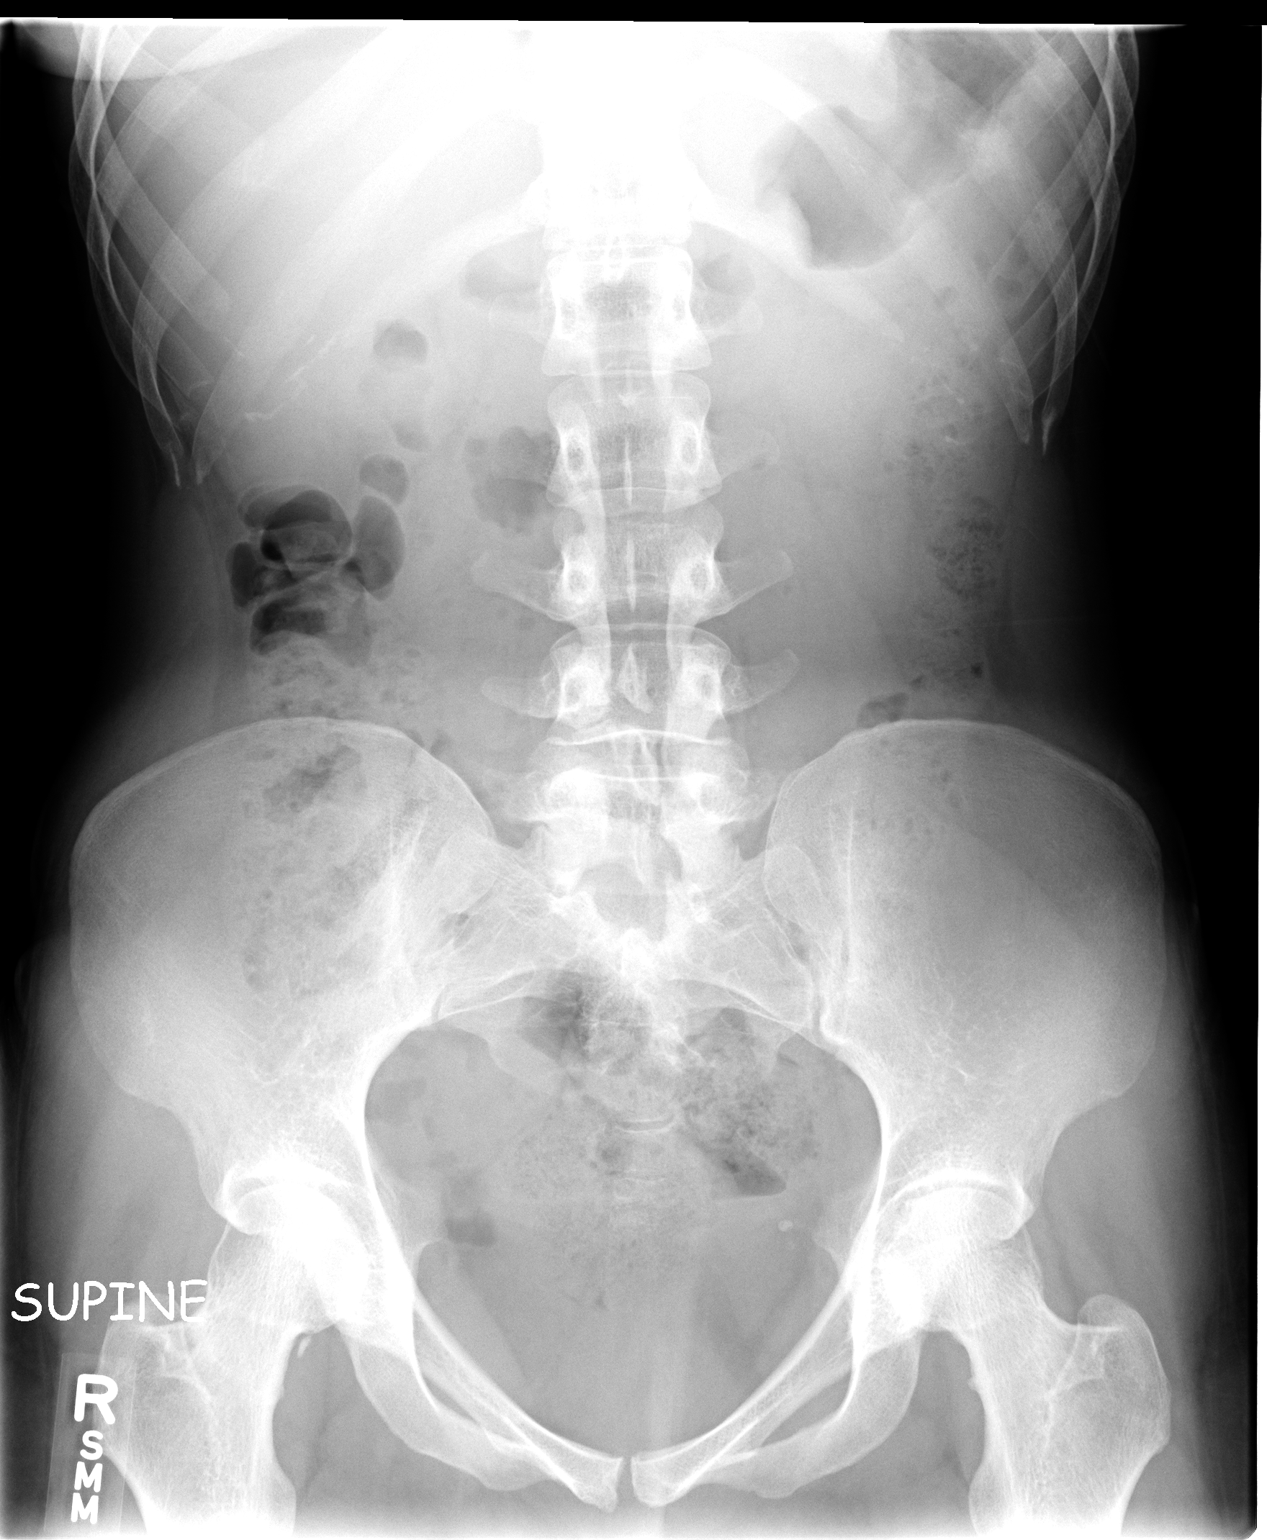

[1 of 1 positions shown; findings below may reference images not displayed]

FINDINGS: Laminectomy defects of the lumbar spine diffusely appear
to be chronic or congenital.  Scattered air and stool throughout
the bowel.  Stool burden appears within normal limits.  Negative
for obstruction or dilatation.  Stable pelvic calcifications
consistent with venous phlebolith.  No acute osseous finding.
IMPRESSION: Normal bowel gas pattern.  Stable exam.

## 2013-11-22 ENCOUNTER — Encounter (HOSPITAL_COMMUNITY): Payer: Self-pay

## 2013-11-22 ENCOUNTER — Inpatient Hospital Stay (HOSPITAL_COMMUNITY)
Admission: AD | Admit: 2013-11-22 | Discharge: 2013-11-23 | Disposition: A | Discharge status: Home / Self Care | Payer: Medicare Other | Source: Ambulatory Visit | Attending: Obstetrics & Gynecology | Admitting: Obstetrics & Gynecology

## 2013-11-22 DIAGNOSIS — L738 Other specified follicular disorders: Secondary | ICD-10-CM | POA: Insufficient documentation

## 2013-11-22 DIAGNOSIS — N764 Abscess of vulva: Secondary | ICD-10-CM

## 2013-11-22 DIAGNOSIS — L739 Follicular disorder, unspecified: Secondary | ICD-10-CM

## 2013-11-22 MED ORDER — CEPHALEXIN 500 MG PO CAPS
500.0000 mg | ORAL_CAPSULE | ORAL | Status: DC
  Filled 2013-11-22: qty 1

## 2013-11-22 MED ORDER — OXYCODONE-ACETAMINOPHEN 5-325 MG PO TABS
2.0000 | ORAL_TABLET | ORAL | Status: AC
  Administered 2013-11-22: 2 via ORAL
  Filled 2013-11-22: qty 2

## 2013-11-22 NOTE — MAU Note (Signed)
Pt reports a boil in her vaginal area, states it has been there x 3 days and is getting bigger and more painful.

## 2013-11-22 NOTE — MAU Provider Note (Signed)
Chief Complaint: Recurrent Skin Infections   None    SUBJECTIVE HPI: Gina Riley is a 27 y.o. G2P1011 who presents to maternity admissions reporting a boil near her vagina that started 3-4 days ago but has gotten larger and more painful.  She has CP and reports the pain and location of the boil make walking difficult.  She reports she has frequent small boils after shaving, usually in the mons area, but never on her labia like this.  She denies vaginal bleeding, vaginal itching/burning, urinary symptoms, h/a, dizziness, n/v, or fever/chills.    She has allergy to codeine but reports she has taken Percocet and Vicodin in the past without problems.   Past Medical History  Diagnosis Date  . Cerebral palsy    Past Surgical History  Procedure Laterality Date  . Cesarean section    . "rhyzotomy for cp" on spine so she could walk.  age 86 years old   History   Social History  . Marital Status: Single    Spouse Name: N/A    Number of Children: N/A  . Years of Education: N/A   Occupational History  . Not on file.   Social History Main Topics  . Smoking status: Current Every Day Smoker -- 0.50 packs/day for 2 years    Types: Cigarettes  . Smokeless tobacco: Not on file  . Alcohol Use: 0.6 oz/week    1 Shots of liquor per week     Comment: 3 weeks ago last drank, socially   . Drug Use: No  . Sexual Activity: Yes    Birth Control/ Protection: Condom     Comment: last month   Other Topics Concern  . Not on file   Social History Narrative  . No narrative on file   No current facility-administered medications on file prior to encounter.   Current Outpatient Prescriptions on File Prior to Encounter  Medication Sig Dispense Refill  . ibuprofen (ADVIL,MOTRIN) 200 MG tablet Take 600 mg by mouth every 4 (four) hours as needed. pain       Allergies  Allergen Reactions  . Codeine Hives    whelps  . Penicillins Hives    ROS: Pertinent items in HPI  OBJECTIVE Blood  pressure 135/88, pulse 74, temperature 98.3 F (36.8 C), temperature source Oral, resp. rate 18, height 5\' 3"  (1.6 m), weight 53.524 kg (118 lb), last menstrual period 10/30/2013, SpO2 100.00%. GENERAL: Well-developed, well-nourished female in no acute distress.  HEENT: Normocephalic HEART: normal rate RESP: normal effort ABDOMEN: Soft, non-tender EXTREMITIES: Nontender, no edema NEURO: Alert and oriented  On visual inspection, 4cm round, firm, tender raised area on left labia.  Appears above bartholin's gland area, originating from hair follicle.    I&D of Abcess Enlarged abscess visible on left labia, with no palpable reach into vagina and largest portion of abcess on external labia majora in area with hair follicles.  Written informed consent was obtained.  Discussed complications and possible outcomes of procedure including recurrence of cyst, scarring leading to infecton, bleeding, dyspareunia, distortion of anatomy.  Patient was examined in the dorsal lithotomy position and mass was identified.  The area was prepped with Iodine and draped in a sterile manner. 1% Lidocaine (3 ml) was then used to infiltrate area on top of the cyst.  A 7 mm incision was made using a sterile scapel. Upon palpation of the mass, a moderate amount of bloody purulent drainage was expressed through the incision. A hemostat was used to break  up loculations, which resulted in expression of more bloody purulent drainage.  Patient tolerated the procedure well, reported feeling " a lot better." - Doxycycline 100 mg BID x10 days - Recommended Sitz baths bid and Motrin and Percocet was given  prn pain.   She was told to call to be examined if she experiences increasing swelling, pain, vaginal discharge, or fever.  - She was instructed to wear a peripad to absorb discharge, and to maintain pelvic rest.    ASSESSMENT 1. Folliculitis   2. Left genital labial abscess     PLAN Consult Dr Tamela Oddi Discharge home F/U  in office this week--call to make appointment Doxycycline 100 mg BID x10 days (Pt has allergy to [facility] and unsure about cephalosporins, also hx recurrent folliculitis) Percocet 5/325, 1-2 tabs Q 6 hours PRN x15 tabs Continue ibuprofen PRN Return to MAU as needed    Medication List    ASK your doctor about these medications       acetaminophen 500 MG tablet  Commonly known as:  TYLENOL  Take 1,000 mg by mouth every 6 (six) hours as needed (For pain.).     ibuprofen 200 MG tablet  Commonly known as:  ADVIL,MOTRIN  Take 600 mg by mouth every 4 (four) hours as needed. pain         Sharen Counter Certified Nurse-Midwife 11/22/2013  9:28 PM

## 2013-11-22 NOTE — MAU Note (Signed)
"  Boil" at vaginal area.  First noticed last Friday but it got bigger, painful, can't hardly walk.  States she shaved and got ingrown hair.

## 2013-11-23 DIAGNOSIS — L738 Other specified follicular disorders: Secondary | ICD-10-CM

## 2013-11-23 MED ORDER — OXYCODONE-ACETAMINOPHEN 5-325 MG PO TABS: 1.0000 | ORAL_TABLET | Freq: Four times a day (QID) | ORAL | Status: DC | PRN

## 2013-11-23 MED ORDER — DOXYCYCLINE HYCLATE 100 MG PO TABS: 100.0000 mg | ORAL_TABLET | Freq: Two times a day (BID) | ORAL | Status: DC

## 2013-11-23 MED ORDER — DOXYCYCLINE HYCLATE 100 MG PO TABS
100.0000 mg | ORAL_TABLET | ORAL | Status: AC
  Administered 2013-11-23: 100 mg via ORAL
  Filled 2013-11-23: qty 1

## 2013-11-23 NOTE — MAU Note (Signed)
Misty Palyn Scrima CNM in has explained Incision/ Drainage procedure of "boil" at left labia, pt voiced understanding.

## 2013-11-23 NOTE — MAU Note (Signed)
Procedure completed.  Pt tolerated well.

## 2014-10-17 ENCOUNTER — Encounter (HOSPITAL_COMMUNITY): Payer: Self-pay

## 2015-02-02 ENCOUNTER — Other Ambulatory Visit (INDEPENDENT_AMBULATORY_CARE_PROVIDER_SITE_OTHER): Payer: Medicare Other

## 2015-02-02 VITALS — Ht 62.0 in | Wt 124.0 lb

## 2015-02-02 DIAGNOSIS — N390 Urinary tract infection, site not specified: Secondary | ICD-10-CM

## 2015-02-02 DIAGNOSIS — Z3202 Encounter for pregnancy test, result negative: Secondary | ICD-10-CM

## 2015-02-02 DIAGNOSIS — Z7251 High risk heterosexual behavior: Secondary | ICD-10-CM

## 2015-02-02 LAB — POCT URINALYSIS DIPSTICK
BILIRUBIN UA: NEGATIVE
Blood, UA: NEGATIVE
GLUCOSE UA: NEGATIVE
KETONES UA: NEGATIVE
NITRITE UA: POSITIVE
PH UA: 7
Protein, UA: NEGATIVE
Spec Grav, UA: 1.015
Urobilinogen, UA: NEGATIVE

## 2015-02-02 LAB — POCT URINE PREGNANCY: PREG TEST UR: NEGATIVE

## 2015-02-02 MED ORDER — NITROFURANTOIN MONOHYD MACRO 100 MG PO CAPS: 100.0000 mg | ORAL_CAPSULE | Freq: Two times a day (BID) | ORAL | Status: DC

## 2015-02-02 NOTE — Progress Notes (Signed)
Patient in the office today for a pregnancy test. Patient states she is a couple of days late on her cycle. Patient states her cycle usually comes every 28 days. Patient states she uses condoms to prevent pregnancy but did not the last few times she did not use them. Patient advised pregnancy test in office is negative. Patient advised to make an appointment for a birth control consult. Patient verbalized understanding. Urine Dip also performed while patient in office due to strong odor to her urine. Patient made aware she did have a UTI and a urine culture has been sent to the lab. Per nursing protocol a prescription for Macrobid has been sent to the pharmacy.   Ht 5\' 2"  (1.575 m)  Wt 124 lb (56.246 kg)  BMI 22.67 kg/m2  LMP 01/02/2015

## 2015-02-04 LAB — URINE CULTURE: Colony Count: >=100000

## 2015-02-06 ENCOUNTER — Other Ambulatory Visit: Payer: Self-pay | Admitting: Obstetrics

## 2015-02-20 ENCOUNTER — Emergency Department (HOSPITAL_COMMUNITY): Payer: No Typology Code available for payment source

## 2015-02-20 ENCOUNTER — Other Ambulatory Visit (HOSPITAL_COMMUNITY): Payer: Self-pay

## 2015-02-20 ENCOUNTER — Encounter (HOSPITAL_COMMUNITY): Payer: Self-pay

## 2015-02-20 ENCOUNTER — Emergency Department (HOSPITAL_COMMUNITY)
Admission: EM | Admit: 2015-02-20 | Discharge: 2015-02-20 | Disposition: A | Discharge status: Home / Self Care | Payer: No Typology Code available for payment source | Attending: Emergency Medicine | Admitting: Emergency Medicine

## 2015-02-20 DIAGNOSIS — Z23 Encounter for immunization: Secondary | ICD-10-CM | POA: Diagnosis not present

## 2015-02-20 DIAGNOSIS — M79661 Pain in right lower leg: Secondary | ICD-10-CM | POA: Diagnosis not present

## 2015-02-20 DIAGNOSIS — Z792 Long term (current) use of antibiotics: Secondary | ICD-10-CM | POA: Diagnosis not present

## 2015-02-20 DIAGNOSIS — M79672 Pain in left foot: Secondary | ICD-10-CM | POA: Diagnosis not present

## 2015-02-20 DIAGNOSIS — Z720 Tobacco use: Secondary | ICD-10-CM | POA: Diagnosis not present

## 2015-02-20 DIAGNOSIS — S79911A Unspecified injury of right hip, initial encounter: Secondary | ICD-10-CM | POA: Diagnosis not present

## 2015-02-20 DIAGNOSIS — Z880 Allergy status to penicillin: Secondary | ICD-10-CM | POA: Diagnosis not present

## 2015-02-20 DIAGNOSIS — S8992XA Unspecified injury of left lower leg, initial encounter: Secondary | ICD-10-CM | POA: Diagnosis not present

## 2015-02-20 DIAGNOSIS — S199XXA Unspecified injury of neck, initial encounter: Secondary | ICD-10-CM | POA: Insufficient documentation

## 2015-02-20 DIAGNOSIS — Y998 Other external cause status: Secondary | ICD-10-CM | POA: Insufficient documentation

## 2015-02-20 DIAGNOSIS — S99922A Unspecified injury of left foot, initial encounter: Secondary | ICD-10-CM | POA: Diagnosis not present

## 2015-02-20 DIAGNOSIS — Z79899 Other long term (current) drug therapy: Secondary | ICD-10-CM | POA: Diagnosis not present

## 2015-02-20 DIAGNOSIS — S8991XA Unspecified injury of right lower leg, initial encounter: Secondary | ICD-10-CM | POA: Diagnosis not present

## 2015-02-20 DIAGNOSIS — S3992XA Unspecified injury of lower back, initial encounter: Secondary | ICD-10-CM | POA: Insufficient documentation

## 2015-02-20 DIAGNOSIS — S81012A Laceration without foreign body, left knee, initial encounter: Secondary | ICD-10-CM | POA: Diagnosis not present

## 2015-02-20 DIAGNOSIS — S0081XA Abrasion of other part of head, initial encounter: Secondary | ICD-10-CM | POA: Insufficient documentation

## 2015-02-20 DIAGNOSIS — M25551 Pain in right hip: Secondary | ICD-10-CM | POA: Diagnosis not present

## 2015-02-20 DIAGNOSIS — S299XXA Unspecified injury of thorax, initial encounter: Secondary | ICD-10-CM | POA: Diagnosis not present

## 2015-02-20 DIAGNOSIS — S99911A Unspecified injury of right ankle, initial encounter: Secondary | ICD-10-CM | POA: Diagnosis not present

## 2015-02-20 DIAGNOSIS — Y9389 Activity, other specified: Secondary | ICD-10-CM | POA: Insufficient documentation

## 2015-02-20 DIAGNOSIS — Y92410 Unspecified street and highway as the place of occurrence of the external cause: Secondary | ICD-10-CM | POA: Insufficient documentation

## 2015-02-20 DIAGNOSIS — S90511A Abrasion, right ankle, initial encounter: Secondary | ICD-10-CM | POA: Insufficient documentation

## 2015-02-20 DIAGNOSIS — M79662 Pain in left lower leg: Secondary | ICD-10-CM | POA: Diagnosis not present

## 2015-02-20 DIAGNOSIS — R52 Pain, unspecified: Secondary | ICD-10-CM | POA: Diagnosis not present

## 2015-02-20 DIAGNOSIS — T1490XA Injury, unspecified, initial encounter: Secondary | ICD-10-CM

## 2015-02-20 DIAGNOSIS — M79604 Pain in right leg: Secondary | ICD-10-CM | POA: Diagnosis not present

## 2015-02-20 DIAGNOSIS — Z8669 Personal history of other diseases of the nervous system and sense organs: Secondary | ICD-10-CM | POA: Diagnosis not present

## 2015-02-20 DIAGNOSIS — M545 Low back pain: Secondary | ICD-10-CM | POA: Diagnosis not present

## 2015-02-20 DIAGNOSIS — G809 Cerebral palsy, unspecified: Secondary | ICD-10-CM | POA: Insufficient documentation

## 2015-02-20 DIAGNOSIS — S0990XA Unspecified injury of head, initial encounter: Secondary | ICD-10-CM | POA: Diagnosis not present

## 2015-02-20 LAB — COMPREHENSIVE METABOLIC PANEL
ALBUMIN: 3.8 g/dL (ref 3.5–5.2)
ALK PHOS: 43 U/L (ref 39–117)
ALT: 18 U/L (ref 0–35)
ANION GAP: 5 (ref 5–15)
AST: 22 U/L (ref 0–37)
BUN: 18 mg/dL (ref 6–23)
CALCIUM: 8.9 mg/dL (ref 8.4–10.5)
CO2: 20 mmol/L (ref 19–32)
Chloride: 112 mmol/L (ref 96–112)
Creatinine, Ser: 0.68 mg/dL (ref 0.50–1.10)
GFR calc non Af Amer: >90 mL/min (ref >90)
GLUCOSE: 115 mg/dL — AB (ref 70–99)
POTASSIUM: 3.6 mmol/L (ref 3.5–5.1)
SODIUM: 137 mmol/L (ref 135–145)
TOTAL PROTEIN: 6.6 g/dL (ref 6.0–8.3)
Total Bilirubin: 0.4 mg/dL (ref 0.3–1.2)

## 2015-02-20 LAB — CBC WITH DIFFERENTIAL/PLATELET
BASOS ABS: 0 10*3/uL (ref 0.0–0.1)
BASOS PCT: 0 % (ref 0–1)
EOS ABS: 0.2 10*3/uL (ref 0.0–0.7)
Eosinophils Relative: 4 % (ref 0–5)
HCT: 36.7 % (ref 36.0–46.0)
HEMOGLOBIN: 12.3 g/dL (ref 12.0–15.0)
Lymphocytes Relative: 40 % (ref 12–46)
Lymphs Abs: 1.6 10*3/uL (ref 0.7–4.0)
MCH: 30.3 pg (ref 26.0–34.0)
MCHC: 33.5 g/dL (ref 30.0–36.0)
MCV: 90.4 fL (ref 78.0–100.0)
MONOS PCT: 10 % (ref 3–12)
Monocytes Absolute: 0.4 10*3/uL (ref 0.1–1.0)
NEUTROS ABS: 1.8 10*3/uL (ref 1.7–7.7)
NEUTROS PCT: 46 % (ref 43–77)
PLATELETS: 153 10*3/uL (ref 150–400)
RBC: 4.06 MIL/uL (ref 3.87–5.11)
RDW: 12.8 % (ref 11.5–15.5)
WBC: 3.9 10*3/uL — AB (ref 4.0–10.5)

## 2015-02-20 LAB — I-STAT BETA HCG BLOOD, ED (MC, WL, AP ONLY): I-stat hCG, quantitative: <5 m[IU]/mL (ref <5)

## 2015-02-20 MED ORDER — HYDROCODONE-ACETAMINOPHEN 5-325 MG PO TABS: 1.0000 | ORAL_TABLET | ORAL | Status: DC | PRN

## 2015-02-20 MED ORDER — ONDANSETRON HCL 4 MG/2ML IJ SOLN
4.0000 mg | Freq: Once | INTRAMUSCULAR | Status: AC
  Administered 2015-02-20: 4 mg via INTRAVENOUS
  Filled 2015-02-20: qty 2

## 2015-02-20 MED ORDER — TETANUS-DIPHTH-ACELL PERTUSSIS 5-2.5-18.5 LF-MCG/0.5 IM SUSP
0.5000 mL | Freq: Once | INTRAMUSCULAR | Status: AC
  Administered 2015-02-20: 0.5 mL via INTRAMUSCULAR
  Filled 2015-02-20: qty 0.5

## 2015-02-20 MED ORDER — IBUPROFEN 600 MG PO TABS: 600.0000 mg | ORAL_TABLET | Freq: Four times a day (QID) | ORAL | Status: DC | PRN

## 2015-02-20 MED ORDER — MORPHINE SULFATE 4 MG/ML IJ SOLN
4.0000 mg | Freq: Once | INTRAMUSCULAR | Status: AC
  Administered 2015-02-20: 4 mg via INTRAVENOUS
  Filled 2015-02-20: qty 1

## 2015-02-20 MED ORDER — SODIUM CHLORIDE 0.9 % IV BOLUS (SEPSIS)
1000.0000 mL | Freq: Once | INTRAVENOUS | Status: AC
  Administered 2015-02-20: 1000 mL via INTRAVENOUS
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
## 2015-02-20 NOTE — Discharge Instructions (Signed)
Take motrin for pain.   You will be stiff and sore tomorrow. Use your cane for several days to help you walk.   Take vicodin for severe pain. Do NOT drive with it.   Follow up with your doctor.   Return to ER if you have worse pain, trouble walking, weakness.

## 2015-02-20 NOTE — Progress Notes (Signed)
Pt on Room Air with Spo2 99%. RT will continue to monitor.

## 2015-04-17 ENCOUNTER — Encounter (HOSPITAL_COMMUNITY): Payer: Self-pay

## 2015-04-17 ENCOUNTER — Emergency Department (INDEPENDENT_AMBULATORY_CARE_PROVIDER_SITE_OTHER)
Admission: EM | Admit: 2015-04-17 | Discharge: 2015-04-17 | Disposition: A | Discharge status: Home / Self Care | Payer: Medicare Other | Source: Home / Self Care | Attending: Family Medicine | Admitting: Family Medicine

## 2015-04-17 DIAGNOSIS — M62838 Other muscle spasm: Secondary | ICD-10-CM | POA: Diagnosis not present

## 2015-04-17 DIAGNOSIS — G809 Cerebral palsy, unspecified: Secondary | ICD-10-CM | POA: Diagnosis not present

## 2015-04-17 MED ORDER — DICLOFENAC SODIUM 1 % TD GEL: 1.0000 "application " | Freq: Four times a day (QID) | TRANSDERMAL | Status: DC

## 2015-04-17 MED ORDER — BACLOFEN 10 MG PO TABS: 10.0000 mg | ORAL_TABLET | Freq: Three times a day (TID) | ORAL | Status: AC
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
## 2015-04-17 NOTE — Discharge Instructions (Signed)
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
## 2015-11-13 ENCOUNTER — Emergency Department (HOSPITAL_COMMUNITY)
Admission: EM | Admit: 2015-11-13 | Discharge: 2015-11-13 | Disposition: A | Discharge status: Home / Self Care | Payer: Medicare Other | Attending: Emergency Medicine | Admitting: Emergency Medicine

## 2015-11-13 ENCOUNTER — Encounter (HOSPITAL_COMMUNITY): Payer: Self-pay

## 2015-11-13 DIAGNOSIS — Z8669 Personal history of other diseases of the nervous system and sense organs: Secondary | ICD-10-CM | POA: Insufficient documentation

## 2015-11-13 DIAGNOSIS — M545 Low back pain: Secondary | ICD-10-CM | POA: Diagnosis not present

## 2015-11-13 DIAGNOSIS — Z87828 Personal history of other (healed) physical injury and trauma: Secondary | ICD-10-CM | POA: Diagnosis not present

## 2015-11-13 DIAGNOSIS — F17210 Nicotine dependence, cigarettes, uncomplicated: Secondary | ICD-10-CM | POA: Diagnosis not present

## 2015-11-13 DIAGNOSIS — Z3202 Encounter for pregnancy test, result negative: Secondary | ICD-10-CM | POA: Insufficient documentation

## 2015-11-13 DIAGNOSIS — M549 Dorsalgia, unspecified: Secondary | ICD-10-CM | POA: Diagnosis present

## 2015-11-13 DIAGNOSIS — B9689 Other specified bacterial agents as the cause of diseases classified elsewhere: Secondary | ICD-10-CM | POA: Diagnosis not present

## 2015-11-13 DIAGNOSIS — N3090 Cystitis, unspecified without hematuria: Secondary | ICD-10-CM | POA: Diagnosis not present

## 2015-11-13 DIAGNOSIS — Z880 Allergy status to penicillin: Secondary | ICD-10-CM | POA: Insufficient documentation

## 2015-11-13 DIAGNOSIS — R52 Pain, unspecified: Secondary | ICD-10-CM | POA: Diagnosis not present

## 2015-11-13 LAB — URINALYSIS, ROUTINE W REFLEX MICROSCOPIC
Bilirubin Urine: NEGATIVE
Glucose, UA: NEGATIVE mg/dL
KETONES UR: 40 mg/dL — AB
NITRITE: POSITIVE — AB
Protein, ur: 30 mg/dL — AB
SPECIFIC GRAVITY, URINE: 1.029 (ref 1.005–1.030)
pH: 6 (ref 5.0–8.0)

## 2015-11-13 LAB — URINE MICROSCOPIC-ADD ON

## 2015-11-13 LAB — PREGNANCY, URINE: Preg Test, Ur: NEGATIVE

## 2015-11-13 MED ORDER — METHOCARBAMOL 500 MG PO TABS: 500.0000 mg | ORAL_TABLET | Freq: Two times a day (BID) | ORAL | Status: DC | PRN

## 2015-11-13 MED ORDER — CEPHALEXIN 500 MG PO CAPS: 500.0000 mg | ORAL_CAPSULE | Freq: Four times a day (QID) | ORAL | Status: DC

## 2015-11-13 MED ORDER — METHOCARBAMOL 500 MG PO TABS
1000.0000 mg | ORAL_TABLET | Freq: Once | ORAL | Status: AC
  Administered 2015-11-13: 1000 mg via ORAL
  Filled 2015-11-13: qty 2

## 2015-11-13 MED ORDER — KETOROLAC TROMETHAMINE 60 MG/2ML IM SOLN
30.0000 mg | Freq: Once | INTRAMUSCULAR | Status: AC
Start: 2015-11-13 — End: 2015-11-13
  Administered 2015-11-13: 30 mg via INTRAMUSCULAR
  Filled 2015-11-13: qty 2
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
## 2015-11-13 NOTE — Progress Notes (Addendum)
Pt's mother at nursing station speaking with ED RN while Cm present Mother states pt left to stay "with a man" and wants her to be checked out Pt provided ED RN an UA Mother states "she is [age] but can not express herself well" Cm inquired about pcp and mother states pt did not have a pcp Reports "each time I took her to one she would not stay"   Please use the toll free number on the back of your insurance card or website to assist with finding a doctor for follow up care Schedule an appointment as soon as possible for a visit As needed This number or website will assist with finding any doctor recommended and will assist in verifying if doctors are in network or on the list for your insurance to prevent extra out of network charges

## 2015-11-13 NOTE — Discharge Instructions (Signed)
1. Medications: Please take all of your antibiotic until finished!, Robaxin is a muscle relaxer - this medication can make you drowsy - please don't drink, drive, or operate heavy machinery when using this medicine, continue usual home medications 2. Treatment: rest, drink plenty of fluids - it is important to stay hydrated!  3. Follow Up: Please follow up with your primary doctor in 3-5 days for discussion of your diagnoses and further evaluation after today's visit; Please return to the ER for any new or worsening symptoms, any additional concerns.

## 2015-11-13 NOTE — ED Notes (Signed)
ED PA at bedside
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
## 2016-05-02 ENCOUNTER — Other Ambulatory Visit (INDEPENDENT_AMBULATORY_CARE_PROVIDER_SITE_OTHER): Payer: Medicare Other

## 2016-05-02 DIAGNOSIS — Z3200 Encounter for pregnancy test, result unknown: Secondary | ICD-10-CM

## 2016-05-02 DIAGNOSIS — Z3201 Encounter for pregnancy test, result positive: Secondary | ICD-10-CM

## 2016-05-02 LAB — POCT URINE PREGNANCY: Preg Test, Ur: NEGATIVE

## 2016-05-02 NOTE — Progress Notes (Unsigned)
Patient came in for Pregnancy test due to illness that included vomiting & menstrual cycle being a little off. UPT: came back negative(-)

## 2016-05-20 ENCOUNTER — Emergency Department (HOSPITAL_COMMUNITY)
Admission: EM | Admit: 2016-05-20 | Discharge: 2016-05-20 | Disposition: A | Discharge status: Home / Self Care | Payer: Medicare Other | Attending: Emergency Medicine | Admitting: Emergency Medicine

## 2016-05-20 ENCOUNTER — Encounter (HOSPITAL_COMMUNITY): Payer: Self-pay | Admitting: Emergency Medicine

## 2016-05-20 ENCOUNTER — Emergency Department (HOSPITAL_COMMUNITY): Payer: Medicare Other

## 2016-05-20 DIAGNOSIS — W19XXXA Unspecified fall, initial encounter: Secondary | ICD-10-CM

## 2016-05-20 DIAGNOSIS — S29011A Strain of muscle and tendon of front wall of thorax, initial encounter: Secondary | ICD-10-CM | POA: Diagnosis not present

## 2016-05-20 DIAGNOSIS — S299XXA Unspecified injury of thorax, initial encounter: Secondary | ICD-10-CM | POA: Diagnosis present

## 2016-05-20 DIAGNOSIS — Z79899 Other long term (current) drug therapy: Secondary | ICD-10-CM | POA: Insufficient documentation

## 2016-05-20 DIAGNOSIS — Y939 Activity, unspecified: Secondary | ICD-10-CM | POA: Insufficient documentation

## 2016-05-20 DIAGNOSIS — Y999 Unspecified external cause status: Secondary | ICD-10-CM | POA: Diagnosis not present

## 2016-05-20 DIAGNOSIS — Y92000 Kitchen of unspecified non-institutional (private) residence as  the place of occurrence of the external cause: Secondary | ICD-10-CM | POA: Insufficient documentation

## 2016-05-20 DIAGNOSIS — Z8669 Personal history of other diseases of the nervous system and sense organs: Secondary | ICD-10-CM | POA: Insufficient documentation

## 2016-05-20 DIAGNOSIS — W010XXA Fall on same level from slipping, tripping and stumbling without subsequent striking against object, initial encounter: Secondary | ICD-10-CM | POA: Insufficient documentation

## 2016-05-20 DIAGNOSIS — F17210 Nicotine dependence, cigarettes, uncomplicated: Secondary | ICD-10-CM | POA: Diagnosis not present

## 2016-05-20 LAB — COMPREHENSIVE METABOLIC PANEL
ALT: 29 U/L (ref 14–54)
ANION GAP: 6 (ref 5–15)
AST: 31 U/L (ref 15–41)
Albumin: 3.9 g/dL (ref 3.5–5.0)
Alkaline Phosphatase: 60 U/L (ref 38–126)
BUN: 15 mg/dL (ref 6–20)
CHLORIDE: 106 mmol/L (ref 101–111)
CO2: 24 mmol/L (ref 22–32)
CREATININE: 0.61 mg/dL (ref 0.44–1.00)
Calcium: 9 mg/dL (ref 8.9–10.3)
GFR calc non Af Amer: >60 mL/min (ref >60)
Glucose, Bld: 88 mg/dL (ref 65–99)
POTASSIUM: 4 mmol/L (ref 3.5–5.1)
SODIUM: 136 mmol/L (ref 135–145)
Total Bilirubin: 0.5 mg/dL (ref 0.3–1.2)
Total Protein: 7.3 g/dL (ref 6.5–8.1)

## 2016-05-20 LAB — URINALYSIS, ROUTINE W REFLEX MICROSCOPIC
Bilirubin Urine: NEGATIVE
GLUCOSE, UA: NEGATIVE mg/dL
HGB URINE DIPSTICK: NEGATIVE
Ketones, ur: NEGATIVE mg/dL
LEUKOCYTES UA: NEGATIVE
Nitrite: POSITIVE — AB
PH: 7.5 (ref 5.0–8.0)
Protein, ur: NEGATIVE mg/dL
Specific Gravity, Urine: 1.018 (ref 1.005–1.030)

## 2016-05-20 LAB — CBC
HEMATOCRIT: 36.2 % (ref 36.0–46.0)
HEMOGLOBIN: 12.4 g/dL (ref 12.0–15.0)
MCH: 30.6 pg (ref 26.0–34.0)
MCHC: 34.3 g/dL (ref 30.0–36.0)
MCV: 89.4 fL (ref 78.0–100.0)
Platelets: 169 10*3/uL (ref 150–400)
RBC: 4.05 MIL/uL (ref 3.87–5.11)
RDW: 13.1 % (ref 11.5–15.5)
WBC: 6 10*3/uL (ref 4.0–10.5)

## 2016-05-20 LAB — I-STAT BETA HCG BLOOD, ED (MC, WL, AP ONLY): I-stat hCG, quantitative: <5 m[IU]/mL (ref <5)

## 2016-05-20 LAB — URINE MICROSCOPIC-ADD ON

## 2016-05-20 MED ORDER — NAPROXEN 500 MG PO TABS: 500.0000 mg | ORAL_TABLET | Freq: Two times a day (BID) | ORAL | Status: DC

## 2016-05-20 MED ORDER — NAPROXEN 500 MG PO TABS
500.0000 mg | ORAL_TABLET | Freq: Once | ORAL | Status: AC
  Administered 2016-05-20: 500 mg via ORAL
  Filled 2016-05-20: qty 1

## 2016-05-20 MED ORDER — ORPHENADRINE CITRATE ER 100 MG PO TB12: 100.0000 mg | ORAL_TABLET | Freq: Two times a day (BID) | ORAL | Status: DC

## 2016-05-20 NOTE — ED Notes (Signed)
Pt c/o sharp right lateral rib cage pain radiating into RLQ abdomen worse with movement and inspiration onset 4 days ago after fall. Lung sounds clear all fields. RUQ abdomen tender to palpation. Negative for rebound tenderness.

## 2016-05-20 NOTE — Discharge Instructions (Signed)
Muscle Strain A muscle strain is an injury that occurs when a muscle is stretched beyond its normal length. Usually a small number of muscle fibers are torn when this happens. Muscle strain is rated in degrees. First-degree strains have the least amount of muscle fiber tearing and pain. Second-degree and third-degree strains have increasingly more tearing and pain.  Usually, recovery from muscle strain takes 1-2 weeks. Complete healing takes 5-6 weeks.  CAUSES  Muscle strain happens when a sudden, violent force placed on a muscle stretches it too far. This may occur with lifting, sports, or a fall.  RISK FACTORS Muscle strain is especially common in athletes.  SIGNS AND SYMPTOMS At the site of the muscle strain, there may be:  Pain.  Bruising.  Swelling.  Difficulty using the muscle due to pain or lack of normal function. DIAGNOSIS  Your health care provider will perform a physical exam and ask about your medical history. TREATMENT  Often, the best treatment for a muscle strain is resting, icing, and applying cold compresses to the injured area.  HOME CARE INSTRUCTIONS   Use the PRICE method of treatment to promote muscle healing during the first 2-3 days after your injury. The PRICE method involves:  Protecting the muscle from being injured again.  Restricting your activity and resting the injured body part.  Icing your injury. To do this, put ice in a plastic bag. Place a towel between your skin and the bag. Then, apply the ice and leave it on from 15-20 minutes each hour. After the third day, switch to moist heat packs.  Apply compression to the injured area with a splint or elastic bandage. Be careful not to wrap it too tightly. This may interfere with blood circulation or increase swelling.  Elevate the injured body part above the level of your heart as often as you can.  Only take over-the-counter or prescription medicines for pain, discomfort, or fever as directed by your  health care provider.  Warming up prior to exercise helps to prevent future muscle strains. SEEK MEDICAL CARE IF:   You have increasing pain or swelling in the injured area.  You have numbness, tingling, or a significant loss of strength in the injured area. MAKE SURE YOU:   Understand these instructions.  Will watch your condition.  Will get help right away if you are not doing well or get worse.   This information is not intended to replace advice given to you by your health care provider. Make sure you discuss any questions you have with your health care provider.   Document Released: 12/02/2005 Document Revised: 09/22/2013 Document Reviewed: 07/01/2013 Elsevier Interactive Patient Education 2016 [employer] Possible Rib Fracture A rib fracture is a break or crack in one of the bones of the ribs. The ribs are a group of long, curved bones that wrap around your chest and attach to your spine. They protect your lungs and other organs in the chest cavity. A broken or cracked rib is often painful, but most do not cause other problems. Most rib fractures heal on their own over time. However, rib fractures can be more serious if multiple ribs are broken or if broken ribs move out of place and push against other structures. CAUSES   A direct blow to the chest. For example, this could happen during contact sports, a car accident, or a fall against a hard object.  Repetitive movements with high force, such as pitching a baseball or having severe coughing spells.  SYMPTOMS   Pain when you breathe in or cough.  Pain when someone presses on the injured area. DIAGNOSIS  Your caregiver will perform a physical exam. Various imaging tests may be ordered to confirm the diagnosis and to look for related injuries. These tests may include a chest X-ray, computed tomography (CT), magnetic resonance imaging (MRI), or a bone scan. TREATMENT  Rib fractures usually heal on their own in 1-3 months. The  longer healing period is often associated with a continued cough or other aggravating activities. During the healing period, pain control is very important. Medication is usually given to control pain. Hospitalization or surgery may be needed for more severe injuries, such as those in which multiple ribs are broken or the ribs have moved out of place.  HOME CARE INSTRUCTIONS   Avoid strenuous activity and any activities or movements that cause pain. Be careful during activities and avoid bumping the injured rib.  Gradually increase activity as directed by your caregiver.  Only take over-the-counter or prescription medications as directed by your caregiver. Do not take other medications without asking your caregiver first.  Apply ice to the injured area for the first 1-2 days after you have been treated or as directed by your caregiver. Applying ice helps to reduce inflammation and pain.  Put ice in a plastic bag.  Place a towel between your skin and the bag.   Leave the ice on for 15-20 minutes at a time, every 2 hours while you are awake.  Perform deep breathing as directed by your caregiver. This will help prevent pneumonia, which is a common complication of a broken rib. Your caregiver may instruct you to:  Take deep breaths several times a day.  Try to cough several times a day, holding a pillow against the injured area.  Use a device called an incentive spirometer to practice deep breathing several times a day.  Drink enough fluids to keep your urine clear or pale yellow. This will help you avoid constipation.   Do not wear a rib belt or binder. These restrict breathing, which can lead to pneumonia.  SEEK IMMEDIATE MEDICAL CARE IF:   You have a fever.   You have difficulty breathing or shortness of breath.   You develop a continual cough, or you cough up thick or bloody sputum.  You feel sick to your stomach (nausea), throw up (vomit), or have abdominal pain.   You  have worsening pain not controlled with medications.  MAKE SURE YOU:  Understand these instructions.  Will watch your condition.  Will get help right away if you are not doing well or get worse.   This information is not intended to replace advice given to you by your health care provider. Make sure you discuss any questions you have with your health care provider.   Document Released: 12/02/2005 Document Revised: 08/04/2013 Document Reviewed: 02/03/2013 Elsevier Interactive Patient Education [employer]
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
## 2016-05-20 NOTE — ED Notes (Signed)
Pt reports slipping on water in her kitchen Friday, reports L side pain.  Then she started to have R side pain as well.  Pt ambulatory without difficulty.  No other complaints at this time.

## 2016-05-29 ENCOUNTER — Encounter (HOSPITAL_COMMUNITY): Payer: Self-pay

## 2016-09-16 ENCOUNTER — Ambulatory Visit (INDEPENDENT_AMBULATORY_CARE_PROVIDER_SITE_OTHER): Payer: Medicaid Other | Admitting: Orthopedic Surgery

## 2016-09-16 ENCOUNTER — Encounter (INDEPENDENT_AMBULATORY_CARE_PROVIDER_SITE_OTHER): Payer: Self-pay

## 2016-09-16 DIAGNOSIS — M25561 Pain in right knee: Secondary | ICD-10-CM | POA: Diagnosis not present

## 2017-05-28 ENCOUNTER — Encounter (HOSPITAL_COMMUNITY): Payer: Self-pay | Admitting: *Deleted

## 2017-05-28 ENCOUNTER — Emergency Department (HOSPITAL_COMMUNITY)
Admission: EM | Admit: 2017-05-28 | Discharge: 2017-05-28 | Disposition: A | Discharge status: Home / Self Care | Payer: Medicare Other | Attending: Emergency Medicine | Admitting: Emergency Medicine

## 2017-05-28 DIAGNOSIS — F17210 Nicotine dependence, cigarettes, uncomplicated: Secondary | ICD-10-CM | POA: Insufficient documentation

## 2017-05-28 DIAGNOSIS — Z79899 Other long term (current) drug therapy: Secondary | ICD-10-CM | POA: Insufficient documentation

## 2017-05-28 DIAGNOSIS — M62830 Muscle spasm of back: Secondary | ICD-10-CM | POA: Insufficient documentation

## 2017-05-28 MED ORDER — ORPHENADRINE CITRATE ER 100 MG PO TB12: 100.0000 mg | ORAL_TABLET | Freq: Two times a day (BID) | ORAL | 0 refills | Status: DC

## 2017-05-28 MED ORDER — NAPROXEN 500 MG PO TABS: 500.0000 mg | ORAL_TABLET | Freq: Two times a day (BID) | ORAL | 0 refills | Status: DC

## 2017-05-28 MED ORDER — KETOROLAC TROMETHAMINE 60 MG/2ML IM SOLN
30.0000 mg | Freq: Once | INTRAMUSCULAR | Status: AC
Start: 2017-05-28 — End: 2017-05-28
  Administered 2017-05-28: 30 mg via INTRAMUSCULAR
  Filled 2017-05-28: qty 2

## 2017-05-28 MED ORDER — ORPHENADRINE CITRATE ER 100 MG PO TB12
100.0000 mg | ORAL_TABLET | Freq: Two times a day (BID) | ORAL | Status: DC
  Administered 2017-05-28: 100 mg via ORAL
  Filled 2017-05-28: qty 1

## 2017-05-28 NOTE — Discharge Instructions (Signed)
Continue naprosyn for pain and inflammation. Norflex for muscle spasms. Follow up with a family doctor
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
## 2017-05-28 NOTE — ED Provider Notes (Signed)
[facility]-EMERGENCY DEPT Provider Note   CSN: [id number] Arrival date & time: 05/28/17  1126     History   Chief Complaint Chief Complaint  Patient presents with  . Spasms    HPI Gina Riley is a 31 y.o. female.  HPI Gina Riley is a 31 y.o. female with history of cerebral palsy, presents to emergency department complaining of chronic back pain. Patient states she is currently not followed by a new primary care doctor. She states that she is having increased pain in the lower back that radiates down her legs bilaterally. She states pain is similar to prior pain. She states that she normally gets muscle relaxants and pain medications for this pain. She denies any recent injuries. No increased weakness in her extremities. No increased numbness. No trouble urinating or trouble in her bladder or bowels. No fever or chills. States she just needs some medication to relieve her pain.  Past Medical History:  Diagnosis Date  . Cerebral palsy (HCC)     There are no active problems to display for this patient.   Past Surgical History:  Procedure Laterality Date  . "rhyzotomy for CP" on spine so she could walk.  age 88 years old  . CESAREAN SECTION      OB History    Gravida Para Term Preterm AB Living   2 1 1  0 1 1   SAB TAB Ectopic Multiple Live Births   1 0 0   1       Home Medications    Prior to Admission medications   Medication Sig Start Date End Date Taking? Authorizing Provider  baclofen (LIORESAL) 10 MG tablet Take 1 tablet (10 mg total) by mouth 3 (three) times daily. Patient not taking: Reported on 11/13/2015 04/17/15   Hayden Rasmussen, NP  cephALEXin (KEFLEX) 500 MG capsule Take 1 capsule (500 mg total) by mouth 4 (four) times daily. 11/13/15   Ward, Chase Picket, PA-C  cyclobenzaprine (FLEXERIL) 10 MG tablet Take 10 mg by mouth 3 (three) times daily as needed for muscle spasms.    [provider]  diclofenac sodium (VOLTAREN) 1 % GEL Apply 1 application  topically 4 (four) times daily. Patient not taking: Reported on 11/13/2015 04/17/15   Hayden Rasmussen, NP  ibuprofen (ADVIL,MOTRIN) 200 MG tablet Take 400-600 mg by mouth every 6 (six) hours as needed for headache, mild pain or moderate pain.    [provider]  ibuprofen (ADVIL,MOTRIN) 600 MG tablet Take 1 tablet (600 mg total) by mouth every 6 (six) hours as needed. Patient not taking: Reported on 11/13/2015 02/20/15   Charlynne Pander, MD  methocarbamol (ROBAXIN) 500 MG tablet Take 1 tablet (500 mg total) by mouth 2 (two) times daily as needed for muscle spasms. 11/13/15   Ward, Chase Picket, PA-C  naproxen (NAPROSYN) 500 MG tablet Take 1 tablet (500 mg total) by mouth 2 (two) times daily. 05/20/16   Arby Barrette, MD  orphenadrine (NORFLEX) 100 MG tablet Take 1 tablet (100 mg total) by mouth 2 (two) times daily. 05/20/16   Arby Barrette, MD  oxyCODONE-acetaminophen (PERCOCET/ROXICET) 5-325 MG tablet Take 1 tablet by mouth every 6 (six) hours as needed for severe pain.    [provider]  Pseudoephedrine-Ibuprofen (ADVIL COLD & SINUS LIQUI-GELS) 30-200 MG CAPS Take 1 capsule by mouth 2 (two) times daily as needed (pain).    [provider]    Family History Family History  Problem Relation Age of Onset  .  Vision loss Father     Social History Social History  Substance Use Topics  . Smoking status: Current Every Day Smoker    Packs/day: 0.20    Types: Cigarettes  . Smokeless tobacco: Not on file  . Alcohol use No     Allergies   Codeine; Codeine; Penicillins; and Penicillins   Review of Systems Review of Systems  Constitutional: Negative for chills and fever.  Respiratory: Negative for cough, chest tightness and shortness of breath.   Cardiovascular: Negative for chest pain, palpitations and leg swelling.  Gastrointestinal: Negative for abdominal pain, diarrhea, nausea and vomiting.  Genitourinary: Negative for dysuria, flank pain, pelvic pain, vaginal  bleeding, vaginal discharge and vaginal pain.  Musculoskeletal: Positive for arthralgias, back pain and myalgias. Negative for neck pain and neck stiffness.  Skin: Negative for rash.  Neurological: Negative for dizziness, weakness and headaches.  All other systems reviewed and are negative.    Physical Exam Updated Vital Signs BP 116/89 (BP Location: Left Arm)   Pulse 77   Temp 98 F (36.7 C) (Oral)   Resp 18   Ht 5\' 3"  (1.6 m)   Wt 54.4 kg (120 lb)   LMP 05/27/2017   SpO2 100%   BMI 21.26 kg/m   Physical Exam  Constitutional: She appears well-developed and well-nourished. No distress.  HENT:  Head: Normocephalic.  Eyes: Conjunctivae are normal.  Neck: Neck supple.  Cardiovascular: Normal rate, regular rhythm and normal heart sounds.   Pulmonary/Chest: Effort normal and breath sounds normal. No respiratory distress. She has no wheezes. She has no rales.  Abdominal: Soft. Bowel sounds are normal. She exhibits no distension. There is no tenderness. There is no rebound.  Musculoskeletal: She exhibits no edema.  Diffuse tenderness to palpation over midline lumbar spine and diffuse lower perispinal muscles. Pain with bilateral straight leg raise.   Neurological: She is alert.  Decreased pateallar reflexes bilaterally. 5/5 and equal lower extremity strength. Pt able to dorsiflex bilateral toes and feet with good strength against resistance. Equal sensation bilaterally over thighs and lower legs.   Skin: Skin is warm and dry.  Psychiatric: She has a normal mood and affect. Her behavior is normal.  Nursing note and vitals reviewed.    ED Treatments / Results  Labs (all labs ordered are listed, but only abnormal results are displayed) Labs Reviewed - No data to display  EKG  EKG Interpretation None       Radiology No results found.  Procedures Procedures (including critical care time)  Medications Ordered in ED Medications  ketorolac (TORADOL) injection 30 mg (not  administered)  orphenadrine (NORFLEX) 12 hr tablet 100 mg (not administered)     Initial Impression / Assessment and Plan / ED Course  I have reviewed the triage vital signs and the nursing notes.  Pertinent labs & imaging results that were available during my care of the patient were reviewed by me and considered in my medical decision making (see chart for details).    Patient with worsening chronic back pain and spasms. No acute injuries. Do not think she needs any imaging on emergent basis. Discussed finding primary care doctor. Will refill Norflex, naproxen for pain. Discussed opiate prescription in ED policy. Follow-up with family doctor as needed.  Vitals:   05/28/17 1148 05/28/17 1158  BP: 116/89   Pulse: 77   Resp: 18   Temp: 98 F (36.7 C)   TempSrc: Oral   SpO2: 100%   Weight: 54.4 kg (120 lb)  54.4 kg (120 lb)  Height: 5\' 3"  (1.6 m) 5\' 3"  (1.6 m)     Final Clinical Impressions(s) / ED Diagnoses   Final diagnoses:  Muscle spasm of back    New Prescriptions Discharge Medication List as of 05/28/2017  1:46 PM       Jaynie Crumble, PA-C 05/28/17 1531    Charlynne Pander, MD 05/28/17 [phone]

## 2017-05-28 NOTE — ED Notes (Signed)
Bed: [facility] Expected date:  Expected time:  Means of arrival:  Comments: 

## 2017-05-28 NOTE — ED Notes (Signed)
ED Provider at bedside. 

## 2017-07-26 ENCOUNTER — Encounter (HOSPITAL_COMMUNITY): Payer: Self-pay | Admitting: *Deleted

## 2017-07-26 ENCOUNTER — Emergency Department (HOSPITAL_COMMUNITY)
Admission: EM | Admit: 2017-07-26 | Discharge: 2017-07-26 | Disposition: A | Discharge status: Home / Self Care | Payer: Medicare Other | Attending: Emergency Medicine | Admitting: Emergency Medicine

## 2017-07-26 DIAGNOSIS — R202 Paresthesia of skin: Secondary | ICD-10-CM | POA: Insufficient documentation

## 2017-07-26 DIAGNOSIS — Z79899 Other long term (current) drug therapy: Secondary | ICD-10-CM | POA: Diagnosis not present

## 2017-07-26 DIAGNOSIS — M62838 Other muscle spasm: Secondary | ICD-10-CM | POA: Diagnosis not present

## 2017-07-26 DIAGNOSIS — F17210 Nicotine dependence, cigarettes, uncomplicated: Secondary | ICD-10-CM | POA: Insufficient documentation

## 2017-07-26 DIAGNOSIS — R252 Cramp and spasm: Secondary | ICD-10-CM | POA: Diagnosis present

## 2017-07-26 DIAGNOSIS — G809 Cerebral palsy, unspecified: Secondary | ICD-10-CM | POA: Insufficient documentation

## 2017-07-26 DIAGNOSIS — M62830 Muscle spasm of back: Secondary | ICD-10-CM | POA: Diagnosis not present

## 2017-07-26 MED ORDER — MELOXICAM 7.5 MG PO TABS: 15.0000 mg | ORAL_TABLET | Freq: Every day | ORAL | 0 refills | Status: DC

## 2017-07-26 MED ORDER — KETOROLAC TROMETHAMINE 60 MG/2ML IM SOLN
60.0000 mg | Freq: Once | INTRAMUSCULAR | Status: AC
  Administered 2017-07-26: 60 mg via INTRAMUSCULAR
  Filled 2017-07-26: qty 2

## 2017-07-26 MED ORDER — DIAZEPAM 5 MG/ML IJ SOLN
5.0000 mg | Freq: Once | INTRAMUSCULAR | Status: AC
  Administered 2017-07-26: 5 mg via INTRAMUSCULAR
  Filled 2017-07-26: qty 2

## 2017-07-26 MED ORDER — DIAZEPAM 5 MG PO TABS
5.0000 mg | ORAL_TABLET | Freq: Two times a day (BID) | ORAL | 0 refills | Status: DC
Start: 2017-07-26 — End: 2018-07-22

## 2017-07-26 NOTE — Discharge Instructions (Signed)
Take the prescribed medication as directed.  Can try heat therapy to try and relax the muscles as well. Follow-up with your primary care doctor if any ongoing issues. Return to the ED for new or worsening symptoms.
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
## 2018-04-02 ENCOUNTER — Encounter: Payer: Self-pay | Admitting: Physical Therapy

## 2018-04-02 ENCOUNTER — Ambulatory Visit: Payer: Medicare Other | Attending: Orthopedic Surgery | Admitting: Physical Therapy

## 2018-04-02 DIAGNOSIS — R262 Difficulty in walking, not elsewhere classified: Secondary | ICD-10-CM | POA: Diagnosis present

## 2018-04-02 DIAGNOSIS — M5441 Lumbago with sciatica, right side: Secondary | ICD-10-CM

## 2018-04-02 DIAGNOSIS — G8929 Other chronic pain: Secondary | ICD-10-CM | POA: Diagnosis present

## 2018-04-02 DIAGNOSIS — M5440 Lumbago with sciatica, unspecified side: Secondary | ICD-10-CM | POA: Insufficient documentation

## 2018-04-02 DIAGNOSIS — R252 Cramp and spasm: Secondary | ICD-10-CM | POA: Diagnosis present

## 2018-04-02 DIAGNOSIS — R296 Repeated falls: Secondary | ICD-10-CM | POA: Diagnosis present

## 2018-04-02 DIAGNOSIS — M6281 Muscle weakness (generalized): Secondary | ICD-10-CM

## 2018-04-02 DIAGNOSIS — R2681 Unsteadiness on feet: Secondary | ICD-10-CM

## 2018-04-02 NOTE — Patient Instructions (Addendum)
Posture Tips DO: - stand tall and erect - keep chin tucked in - keep head and shoulders in alignment - check posture regularly in mirror or large window - pull head back against headrest in car seat;  Change your position often.  Sit with lumbar support. DON'T: - slouch or slump while watching TV or reading - sit, stand or lie in one position  for too long;  Sitting is especially hard on the spine so if you sit at a desk/use the computer, then stand up often!   Copyright  VHI. All rights reserved.  Posture - Standing   Good posture is important. Avoid slouching and forward head thrust. Maintain curve in low back and align ears over shoul- ders, hips over ankles.  Pull your belly button in toward your back bone. Stand with even weight in your heels and toes,  Ribs lifted up and chin down  Copyright  VHI. All rights reserved.  Posture - Sitting   Sit upright, head facing forward. Try using a roll to support lower back. Keep shoulders relaxed, and avoid rounded back. Keep hips level with knees. Avoid crossing legs for long periods.  sit on your sit bones and not your tailbone.  Change sides if you lean to the left.  Try to lean to the right  STOP DATRICE.  dont sit on your sacrum/ tailbone.  Copyright  VHI. All rights reserved.  Sleeping on Back  Place pillow under knees. A pillow with cervical support and a roll around waist are also helpful. Copyright  VHI. All rights reserved.  Sleeping on Side Place pillow between knees. Use cervical support under neck and a roll around waist as needed. Copyright  VHI. All rights reserved.   Sleeping on Stomach   If this is the only desirable sleeping position, place pillow under lower legs, and under stomach or chest as needed.  Posture - Sitting   Sit upright, head facing forward. Try using a roll to support lower back. Keep shoulders relaxed, and avoid rounded back. Keep hips level with knees. Avoid crossing legs for long periods. Stand to  Sit / Sit to Stand   To sit: Bend knees to lower self onto front edge of chair, then scoot back on seat. To stand: Reverse sequence by placing one foot forward, and scoot to front of seat. Use rocking motion to stand up.   Work Height and Reach  Ideal work height is no more than 2 to 4 inches below elbow level when standing, and at elbow level when sitting. Reaching should be limited to arm's length, with elbows slightly bent.  Bending  Bend at hips and knees, not back. Keep feet shoulder-width apart.    Posture - Standing   Good posture is important. Avoid slouching and forward head thrust. Maintain curve in low back and align ears over shoul- ders, hips over ankles.  Alternating Positions   Alternate tasks and change positions frequently to reduce fatigue and muscle tension. Take rest breaks. Computer Work   Position work to Art gallery managerface forward. Use proper work and seat height. Keep shoulders back and down, wrists straight, and elbows at right angles. Use chair that provides full back support. Add footrest and lumbar roll as needed.  Getting Into / Out of Car  Lower self onto seat, scoot back, then bring in one leg at a time. Reverse sequence to get out.  Dressing  Lie on back to pull socks or slacks over feet, or sit and bend leg  while keeping back straight.    Housework - Sink  Place one foot on ledge of cabinet under sink when standing at sink for prolonged periods.   Pushing / Pulling  Pushing is preferable to pulling. Keep back in proper alignment, and use leg muscles to do the work.  Deep Squat   Squat and lift with both arms held against upper trunk. Tighten stomach muscles without holding breath. Use smooth movements to avoid jerking.  Avoid Twisting   Avoid twisting or bending back. Pivot around using foot movements, and bend at knees if needed when reaching for articles.  Carrying Luggage   Distribute weight evenly on both sides. Use a cart whenever  possible. Do not twist trunk. Move body as a unit.   Lifting Principles .Maintain proper posture and head alignment. .Slide object as close as possible before lifting. .Move obstacles out of the way. .Test before lifting; ask for help if too heavy. .Tighten stomach muscles without holding breath. .Use smooth movements; do not jerk. .Use legs to do the work, and pivot with feet. .Distribute the work load symmetrically and close to the center of trunk. .Push instead of pull whenever possible.   Ask For Help   Ask for help and delegate to others when possible. Coordinate your movements when lifting together, and maintain the low back curve.  Log Roll   Lying on back, bend left knee and place left arm across chest. Roll all in one movement to the right. Reverse to roll to the left. Always move as one unit. Housework - Sweeping  Use long-handled equipment to avoid stooping.   Housework - Wiping  Position yourself as close as possible to reach work surface. Avoid straining your back.  Laundry - Unloading Wash   To unload small items at bottom of washer, lift leg opposite to arm being used to reach.  Gardening - Raking  Move close to area to be raked. Use arm movements to do the work. Keep back straight and avoid twisting.     Cart  When reaching into cart with one arm, lift opposite leg to keep back straight.   Getting Into / Out of Bed  Lower self to lie down on one side by raising legs and lowering head at the same time. Use arms to assist moving without twisting. Bend both knees to roll onto back if desired. To sit up, start from lying on side, and use same move-ments in reverse. Housework - Vacuuming  Hold the vacuum with arm held at side. Step back and forth to move it, keeping head up. Avoid twisting.   Laundry - Armed forces training and education officer so that bending and twisting can be avoided.   Laundry - Unloading Dryer  Squat down to reach into clothes  dryer or use a reacher.  Gardening - Weeding / Psychiatric nurse or Kneel. Knee pads may be helpful.                    Garen Lah, PT Certified Exercise Expert for the Aging Adult  04/02/18 10:34 AM Phone: [phone] Fax: [phone]
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
## 2018-04-14 ENCOUNTER — Ambulatory Visit: Payer: Medicare Other | Admitting: Physical Therapy

## 2018-04-17 ENCOUNTER — Ambulatory Visit: Payer: Medicare Other | Attending: Orthopedic Surgery | Admitting: Physical Therapy

## 2018-04-17 ENCOUNTER — Encounter: Payer: Self-pay | Admitting: Physical Therapy

## 2018-04-17 DIAGNOSIS — R296 Repeated falls: Secondary | ICD-10-CM | POA: Insufficient documentation

## 2018-04-17 DIAGNOSIS — G8929 Other chronic pain: Secondary | ICD-10-CM | POA: Diagnosis present

## 2018-04-17 DIAGNOSIS — R2681 Unsteadiness on feet: Secondary | ICD-10-CM | POA: Insufficient documentation

## 2018-04-17 DIAGNOSIS — R252 Cramp and spasm: Secondary | ICD-10-CM

## 2018-04-17 DIAGNOSIS — M6281 Muscle weakness (generalized): Secondary | ICD-10-CM | POA: Diagnosis present

## 2018-04-17 DIAGNOSIS — R262 Difficulty in walking, not elsewhere classified: Secondary | ICD-10-CM | POA: Diagnosis present

## 2018-04-17 DIAGNOSIS — M5440 Lumbago with sciatica, unspecified side: Secondary | ICD-10-CM | POA: Diagnosis present

## 2018-04-17 NOTE — Therapy (Signed)
[facility] [address] [place], [place], [address] Phone: [phone]   Fax:  [phone]  Physical Therapy Treatment  Patient Details  Name: Gina Riley MRN: [id number] Date of Birth: [birth year] Referring Provider: Venita Lick MD   Encounter Date: 04/17/2018  PT End of Session - 04/17/18 0716    Visit Number  2    Number of Visits  16    Date for PT Re-Evaluation  05/28/18    Authorization Type  Medicare/medicaid  10th visit progress note    PT Start Time  0715    PT Stop Time  0800    PT Time Calculation (min)  45 min       Past Medical History:  Diagnosis Date  . Cerebral palsy [facility])     Past Surgical History:  Procedure Laterality Date  . "rhyzotomy for CP" on spine so she could walk.  age 32 years old  . CESAREAN SECTION      There were no vitals filed for this visit.  Subjective Assessment - 04/17/18 0718    Subjective  I took my meds so I am a 6/10 this morning.     Currently in Pain?  Yes    Pain Score  6     Pain Orientation  Right;Left;Lower    Pain Descriptors / Indicators  Sore    Pain Type  Chronic pain         [facility] PT Assessment - 04/17/18 0001      Standardized Balance Assessment   Standardized Balance Assessment  Berg Balance Test      Berg Balance Test   Sit to Stand  Able to stand without using hands and stabilize independently    Standing Unsupported  Able to stand safely 2 minutes    Sitting with Back Unsupported but Feet Supported on Floor or Stool  Able to sit safely and securely 2 minutes    Stand to Sit  Controls descent by using hands    Transfers  Able to transfer safely, minor use of hands    Standing Unsupported with Eyes Closed  Able to stand 10 seconds safely    Standing Ubsupported with Feet Together  Able to place feet together independently and stand 1 minute safely    From Standing, Reach Forward with Outstretched Arm  Can reach confidently >25 cm (10")    From  Standing Position, Pick up Object from Floor  Able to pick up shoe safely and easily    From Standing Position, Turn to Look Behind Over each Shoulder  Looks behind from both sides and weight shifts well    Turn 360 Degrees  Able to turn 360 degrees safely but slowly    Standing Unsupported, Alternately Place Feet on Step/Stool  Able to complete 4 steps without aid or supervision completed 8 steps however needed supervision/CGA    Standing Unsupported, One Foot in Front  Able to plae foot ahead of the other independently and hold 30 seconds    Standing on One Leg  Able to lift leg independently and hold equal to or more than 3 seconds    Total Score  48                   [facility] Adult PT Treatment/Exercise - 04/17/18 0001      Knee/Hip Exercises: Stretches   Active Hamstring Stretch  Right;Left;3 reps;30 seconds    Active Hamstring Stretch Limitations  seated    Gastroc  Stretch  3 reps;30 seconds    Gastroc Stretch Limitations  runners stretch at counter, cues for hip alignment           Balance Exercises - 04/17/18 0832      Balance Exercises: Standing   Tandem Stance  Eyes open;Intermittent upper extremity support    SLS  Eyes open;Intermittent upper extremity support        PT Education - 04/17/18 0831    Education provided  Yes    Education Details  HEP, Use medical supply to obtain RW     Person(s) Educated  Patient    Methods  Explanation;Handout    Comprehension  Verbalized understanding       PT Short Term Goals - 04/02/18 1359      PT SHORT TERM GOAL #1   Title  Pt will be independent with initial HEP    Baseline  Pt does not currently have a HEP    Time  4    Period  Weeks    Status  New    Target Date  04/30/18      PT SHORT TERM GOAL #2   Title  Pt will be tested on BERG and  LTG set    Baseline  Pt has had 7 falls in last 6 months    Time  4    Period  Weeks    Status  New    Target Date  04/30/18      PT SHORT TERM GOAL #3   Title  Pt  will increase hamstring flexibility to at least 60 degrees to reduce back pain to at least 4/10 or less    Time  4    Period  Weeks    Status  New    Target Date  04/30/18      PT SHORT TERM GOAL #4   Title  Pt will be educated on proper use of walker to decrease fall risk and be independent with home use    Time  4    Period  Weeks    Status  New    Target Date  04/30/18        PT Long Term Goals - 04/02/18 1402      PT LONG TERM GOAL #1   Title  "Demonstrate and verbalize techniques to reduce the risk of re-injury including: lifting, posture, body mechanics.     Time  8    Period  Weeks    Status  New    Target Date  05/28/18      PT LONG TERM GOAL #2   Title  "Pt will be independent with advanced HEP.     Time  8    Period  Weeks    Status  New    Target Date  05/28/18      PT LONG TERM GOAL #3   Title  "Pt will not wake due to pain while turning in bed while sleeping at night to recieve at least 4 hours of uninterrupted sleep at night    Baseline  Pt bothered by constant tingling and pain at night or at rest radiating into bil legs    Time  8    Period  Weeks    Status  New    Target Date  05/28/18      PT LONG TERM GOAL #4   Title  Pt will be evaluatated by BERG and will work on balance issues in order to be  able to throw ball safely to 60 year old son without falls    Time  8    Period  Weeks    Status  New    Target Date  05/28/18      PT LONG TERM GOAL #5   Title  Pt will be able to perform household chores with proper body mechanics to reduce pain to at least  =/< 3/10     Time  8    Period  Weeks    Status  New    Target Date  05/28/18      PT LONG TERM GOAL #6   Title  "FOTO will improve from 39% limitation    to 34% limitation    indicating improved functional mobility.     Time  8    Period  Weeks    Status  New    Target Date  05/28/18      PT LONG TERM GOAL #7   Title  "Pt will improve her L/R knee flexion to  >/= 125 degrees and extension  to </= 3 degrees with </= 3/10 pain for a more functional and efficient gait pattern    Time  8    Period  Weeks    Status  New    Target Date  05/28/18      PT LONG TERM GOAL #8   Title  "Pt will tolerate walking for 90 minutes without increased pain > than 3/10  in order to return to PLOF before exacerbating back pain    Time  8    Period  Weeks    Status  New    Target Date  05/28/18      PT LONG TERM GOAL  #9   TITLE  Pt will be able to negotiate steps safely with falls prevention information and to be able to climb apartment 13 steps with 50% greater ease    Time  8    Period  Weeks    Status  New    Target Date  05/28/18            Plan - 04/17/18 0756    Clinical Impression Statement  BERG 48/56. Reviewed HEP. She requires cues for proper form with stretches. Issued Balance HEP. She went to two pharmacies and was unable to get RW. Pt was given 2 places she can aquire RW with her referral.     PT Next Visit Plan  Set BERG Goal , teach gait walking. stairs, flexibiity hamstring exercises review body mechanics/ posture, balance     PT Home Exercise Plan  posture/ body mechanics, hamstring stretch, calf stretch, tandem stance     Consulted and Agree with Plan of Care  Patient       Patient will benefit from skilled therapeutic intervention in order to improve the following deficits and impairments:  Abnormal gait, Decreased balance, Decreased range of motion, Decreased mobility, Decreased strength, Difficulty walking, Hypomobility, Increased muscle spasms, Impaired sensation, Postural dysfunction, Improper body mechanics, Pain, Impaired flexibility  Visit Diagnosis: Chronic bilateral low back pain with sciatica, sciatica laterality unspecified  Muscle weakness (generalized)  Cramp and spasm  Difficulty in walking, not elsewhere classified  Unsteadiness on feet  Repeated falls     Problem List There are no active problems to display for this  patient.   Sherrie Mustache , [place] 04/17/2018, 8:46 AM  [facility] [address] [place], [place], [address] Phone: [phone]  Fax:  [phone]  Name: Gina Riley MRN: [id number] Date of Birth: May 30, 1986

## 2018-04-21 ENCOUNTER — Ambulatory Visit: Payer: Medicare Other | Admitting: Physical Therapy

## 2018-04-21 ENCOUNTER — Encounter: Payer: Self-pay | Admitting: Physical Therapy

## 2018-04-21 DIAGNOSIS — M5440 Lumbago with sciatica, unspecified side: Principal | ICD-10-CM

## 2018-04-21 DIAGNOSIS — R252 Cramp and spasm: Secondary | ICD-10-CM

## 2018-04-21 DIAGNOSIS — M6281 Muscle weakness (generalized): Secondary | ICD-10-CM

## 2018-04-21 DIAGNOSIS — R2681 Unsteadiness on feet: Secondary | ICD-10-CM

## 2018-04-21 DIAGNOSIS — R296 Repeated falls: Secondary | ICD-10-CM

## 2018-04-21 DIAGNOSIS — G8929 Other chronic pain: Secondary | ICD-10-CM

## 2018-04-21 DIAGNOSIS — R262 Difficulty in walking, not elsewhere classified: Secondary | ICD-10-CM

## 2018-04-21 NOTE — Therapy (Signed)
[facility] [address] [place], [place], [address] Phone: [phone]   Fax:  [phone]  Physical Therapy Treatment  Patient Details  Name: Gina Riley MRN: [id number] Date of Birth: 03/14/1986 Referring Provider: Venita Lick MD   Encounter Date: 04/21/2018  PT End of Session - 04/21/18 1019    Visit Number  3    Number of Visits  16    Date for PT Re-Evaluation  05/28/18    Authorization Type  Medicare/medicaid  10th visit progress note    PT Start Time  1015    PT Stop Time  1058    PT Time Calculation (min)  43 min       Past Medical History:  Diagnosis Date  . Cerebral palsy [facility])     Past Surgical History:  Procedure Laterality Date  . "rhyzotomy for CP" on spine so she could walk.  age 32 years old  . CESAREAN SECTION      There were no vitals filed for this visit.  Subjective Assessment - 04/21/18 1019    Currently in Pain?  No/denies                       [facility] Adult PT Treatment/Exercise - 04/21/18 0001      Lumbar Exercises: Standing   Other Standing Lumbar Exercises  one foot on step, hip hikes x 10 each       Lumbar Exercises: Seated   Sit to Stand  10 reps    Sit to Stand Limitations  green band isometric ER     Other Seated Lumbar Exercises  seated clam green band+HEP       Knee/Hip Exercises: Stretches   Active Hamstring Stretch  Right;Left;3 reps;30 seconds    Active Hamstring Stretch Limitations  seated    Gastroc Stretch  3 reps;30 seconds    Gastroc Stretch Limitations  runners stretch at counter, cues for hip alignment           Balance Exercises - 04/21/18 1059      Balance Exercises: Standing   Tandem Stance  Eyes open;Intermittent upper extremity support    Tandem Gait  4 reps;Forward;Intermittent upper extremity support    Sidestepping  4 reps    Other Standing Exercises  foam pad wide and narrow balance with eyes open and eyes closed head turns.          PT Education - 04/21/18 1057    Education provided  Yes    Education Details  HEP    Person(s) Educated  Patient    Methods  Explanation;Handout    Comprehension  Verbalized understanding       PT Short Term Goals - 04/02/18 1359      PT SHORT TERM GOAL #1   Title  Pt will be independent with initial HEP    Baseline  Pt does not currently have a HEP    Time  4    Period  Weeks    Status  New    Target Date  04/30/18      PT SHORT TERM GOAL #2   Title  Pt will be tested on BERG and  LTG set    Baseline  Pt has had 7 falls in last 6 months    Time  4    Period  Weeks    Status  New    Target Date  04/30/18      PT SHORT  TERM GOAL #3   Title  Pt will increase hamstring flexibility to at least 60 degrees to reduce back pain to at least 4/10 or less    Time  4    Period  Weeks    Status  New    Target Date  04/30/18      PT SHORT TERM GOAL #4   Title  Pt will be educated on proper use of walker to decrease fall risk and be independent with home use    Time  4    Period  Weeks    Status  New    Target Date  04/30/18        PT Long Term Goals - 04/02/18 1402      PT LONG TERM GOAL #1   Title  "Demonstrate and verbalize techniques to reduce the risk of re-injury including: lifting, posture, body mechanics.     Time  8    Period  Weeks    Status  New    Target Date  05/28/18      PT LONG TERM GOAL #2   Title  "Pt will be independent with advanced HEP.     Time  8    Period  Weeks    Status  New    Target Date  05/28/18      PT LONG TERM GOAL #3   Title  "Pt will not wake due to pain while turning in bed while sleeping at night to recieve at least 4 hours of uninterrupted sleep at night    Baseline  Pt bothered by constant tingling and pain at night or at rest radiating into bil legs    Time  8    Period  Weeks    Status  New    Target Date  05/28/18      PT LONG TERM GOAL #4   Title  Pt will be evaluatated by BERG and will work on balance issues  in order to be able to throw ball safely to 33 year old son without falls    Time  8    Period  Weeks    Status  New    Target Date  05/28/18      PT LONG TERM GOAL #5   Title  Pt will be able to perform household chores with proper body mechanics to reduce pain to at least  =/< 3/10     Time  8    Period  Weeks    Status  New    Target Date  05/28/18      PT LONG TERM GOAL #6   Title  "FOTO will improve from 39% limitation    to 34% limitation    indicating improved functional mobility.     Time  8    Period  Weeks    Status  New    Target Date  05/28/18      PT LONG TERM GOAL #7   Title  "Pt will improve her L/R knee flexion to  >/= 125 degrees and extension to </= 3 degrees with </= 3/10 pain for a more functional and efficient gait pattern    Time  8    Period  Weeks    Status  New    Target Date  05/28/18      PT LONG TERM GOAL #8   Title  "Pt will tolerate walking for 90 minutes without increased pain > than 3/10  in order to  return to PLOF before exacerbating back pain    Time  8    Period  Weeks    Status  New    Target Date  05/28/18      PT LONG TERM GOAL  #9   TITLE  Pt will be able to negotiate steps safely with falls prevention information and to be able to climb [address] steps with 50% greater ease    Time  8    Period  Weeks    Status  New    Target Date  05/28/18            Plan - 04/21/18 1019    Clinical Impression Statement  Pt reports she reveived a Back brace in the mail from medicare and she feels like it is helping her stand up straight. She reports compliance with HEP. Reviewed HEP and focused balance today.     PT Next Visit Plan  Set BERG Goal , teach gait walking. stairs, flexibiity hamstring exercises review body mechanics/ posture, balance     PT Home Exercise Plan  posture/ body mechanics, hamstring stretch, calf stretch, tandem stance , green band clam seated or hooklying    Consulted and Agree with Plan of Care  Patient        Patient will benefit from skilled therapeutic intervention in order to improve the following deficits and impairments:  Abnormal gait, Decreased balance, Decreased range of motion, Decreased mobility, Decreased strength, Difficulty walking, Hypomobility, Increased muscle spasms, Impaired sensation, Postural dysfunction, Improper body mechanics, Pain, Impaired flexibility  Visit Diagnosis: Chronic bilateral low back pain with sciatica, sciatica laterality unspecified  Muscle weakness (generalized)  Cramp and spasm  Difficulty in walking, not elsewhere classified  Unsteadiness on feet  Repeated falls     Problem List There are no active problems to display for this patient.   Sherrie Mustache, [place] 04/21/2018, 11:00 AM  [facility] [address] [place], [place], [address] Phone: [phone]   Fax:  [phone]  Name: LUWANNA BROSSMAN MRN: [id number] Date of Birth: [birth year]

## 2018-04-23 ENCOUNTER — Telehealth: Payer: Self-pay | Admitting: Physical Therapy

## 2018-04-23 ENCOUNTER — Ambulatory Visit: Payer: Medicare Other | Admitting: Physical Therapy

## 2018-04-23 NOTE — Telephone Encounter (Signed)
Spoke to patient regarding no-show to her appointment this morning. She reports she forgot to call and cancel. She is not feeling well.

## 2018-04-28 ENCOUNTER — Ambulatory Visit: Payer: Medicare Other | Admitting: Physical Therapy

## 2018-04-28 DIAGNOSIS — R262 Difficulty in walking, not elsewhere classified: Secondary | ICD-10-CM

## 2018-04-28 DIAGNOSIS — G8929 Other chronic pain: Secondary | ICD-10-CM

## 2018-04-28 DIAGNOSIS — R2681 Unsteadiness on feet: Secondary | ICD-10-CM

## 2018-04-28 DIAGNOSIS — M5440 Lumbago with sciatica, unspecified side: Secondary | ICD-10-CM | POA: Diagnosis not present

## 2018-04-28 DIAGNOSIS — R252 Cramp and spasm: Secondary | ICD-10-CM

## 2018-04-28 DIAGNOSIS — R296 Repeated falls: Secondary | ICD-10-CM

## 2018-04-28 DIAGNOSIS — M6281 Muscle weakness (generalized): Secondary | ICD-10-CM

## 2018-04-28 NOTE — Therapy (Signed)
[place], [place], [address] Phone: [phone]   Fax:  [phone]  Physical Therapy Treatment  Patient Details  Name: Gina Riley MRN: [id number] Date of Birth: [birth year] Referring Provider: Melina Schools MD   Encounter Date: 04/28/2018  PT End of Session - 04/28/18 1011    Visit Number  4    Number of Visits  16    Date for PT Re-Evaluation  05/28/18    PT Start Time  1013    PT Stop Time  1100    PT Time Calculation (min)  47 min    Activity Tolerance  Patient tolerated treatment well    Behavior During Therapy  [facility] for tasks assessed/performed       Past Medical History:  Diagnosis Date  . Cerebral palsy [facility])     Past Surgical History:  Procedure Laterality Date  . "rhyzotomy for CP" on spine so she could walk.  age 32 years old  . CESAREAN SECTION      There were no vitals filed for this visit.  Subjective Assessment - 04/28/18 1014    Subjective  Last week I had to go to [facility] to see Dr. Rolla Etienne. and he is going to give me and MRI and a NCV study over whole body.  Yesterday I had a slight stumble but caught myself. Ms. Mayo is getting a walker for tomorrow from [facility] . Pt is having difficulty getting transportation.     Pertinent History   [place]ians. 7 falls in last 6 months rhizotomy for CP in order to walk at age 28-6,  ALLERGIES to PCN and Codeine    How long can you sit comfortably?  1 hour with a back brace    How long can you stand comfortably?  now 15 minutes with back brace    How long can you walk comfortably?  now 15 minutes with back brace    Diagnostic tests  getting MRI on May 22, and dx test 21st at [facility]    Patient Stated Goals  to improve my mobility and prevent falls, and get stronger to be more mobile, Be able to be more active/play with my 71 year old son.  throw ball with my son    Currently in Pain?  Yes    Pain Score  6     Pain Location  Back     Pain Orientation  Right;Left;Lower    Pain Descriptors / Indicators  Sore    Pain Type  Chronic pain    Pain Onset  More than a month ago    Pain Frequency  Intermittent         [facility] PT Assessment - 04/28/18 1107      AROM   Right Knee Flexion  115    Left Knee Flexion  116      Flexibility   Hamstrings  right 65, left 50 spasticity                   [facility] Adult PT Treatment/Exercise - 04/28/18 1109      Neuro Re-ed    Neuro Re-ed Details   cone touching with feet apart and in tandem to left 12, 10 and 7 oclock cones to right and  to left at 12 noon, 3 and 5 o cloock for 5 minutes,  also ball throwing with feet apart and with narrow width for  6 minutes various levels shoulder height and over head  with green medicine ball        Lumbar Exercises: Standing   Other Standing Lumbar Exercises  one foot on step, hip hikes x 10 each  VC for keeping right knee straight.  compensates      Lumbar Exercises: Seated   Sit to Stand  10 reps      Lumbar Exercises: Quadruped   Madcat/Old Horse  15 reps    Madcat/Old Horse Limitations  x 2 with VC for  neck positons and abd setting    Straight Leg Raise  15 reps;3 seconds      Knee/Hip Exercises: Stretches   Active Hamstring Stretch  Right;Left;3 reps;30 seconds    Active Hamstring Stretch Limitations  seated      Knee/Hip Exercises: Standing   Lateral Step Up  Right;Left;Hand Hold: 2;15 reps    Forward Step Up  Right;Left;Hand Hold: 2;15 reps    Functional Squat  15 reps;1 set    Functional Squat Limitations  sink holding      Knee/Hip Exercises: Supine   Bridges  15 reps;1 set             PT Education - 04/28/18 1118    Education provided  Yes    Education Details  added standing HEP exercises to [employer]) Educated  Patient    Methods  Explanation;Demonstration;Tactile cues;Verbal cues;Handout    Comprehension  Verbalized understanding;Returned demonstration       PT Short Term Goals -  04/28/18 1019      PT SHORT TERM GOAL #1   Title  Pt will be independent with initial HEP    Baseline  Pt given intial standing and neuromuscular HEP    Time  4    Period  Weeks    Status  On-going      PT SHORT TERM GOAL #2   Title  Pt will be tested on BERG and  LTG set    Baseline  48/56    Time  4    Period  Weeks    Status  Achieved      PT SHORT TERM GOAL #3   Title  Pt will increase hamstring flexibility to at least 60 degrees to reduce back pain to at least 4/10 or less    Baseline  65 degrees right  6/10 left 50    Time  4    Period  Weeks    Status  Partially Met      PT SHORT TERM GOAL #4   Title  Pt will be educated on proper use of walker to decrease fall risk and be independent with home use    Baseline  Pt getting walker tomorrow from [facility]    Time  4    Period  Weeks    Status  On-going        PT Long Term Goals - 04/28/18 1103      PT LONG TERM GOAL #1   Title  "Demonstrate and verbalize techniques to reduce the risk of re-injury including: lifting, posture, body mechanics.     Baseline  given written and intiial teaching    Time  8    Period  Weeks    Status  On-going      PT LONG TERM GOAL #2   Title  "Pt will be independent with advanced HEP.     Baseline  Pt given initial HEP    Time  8    Period  Weeks  Status  On-going      PT LONG TERM GOAL #3   Title  "Pt will not wake due to pain while turning in bed while sleeping at night to recieve at least 4 hours of uninterrupted sleep at night    Baseline  Using 2 pillows at night  3 hours    Time  8    Period  Weeks    Status  On-going      PT LONG TERM GOAL #4   Title  Pt will be evaluatated by BERG and will work on balance issues in order to be able to throw ball safely to 47 year old son without falls    Baseline  working on ball toss and cone touncing in standing. and tandem stance    Time  8    Period  Weeks    Status  On-going      PT LONG TERM GOAL #5   Title  Pt will be able  to perform household chores with proper body mechanics to reduce pain to at least  =/< 3/10     Baseline  6/10 today    Period  Weeks    Status  On-going      PT LONG TERM GOAL #6   Title  "FOTO will improve from 39% limitation    to 34% limitation    indicating improved functional mobility.     Time  8    Period  Weeks    Status  Unable to assess      PT LONG TERM GOAL #7   Title  "Pt will improve her L/R knee flexion to  >/= 125 degrees and extension to </= 3 degrees with </= 3/10 pain for a more functional and efficient gait pattern    Baseline  115            Plan - 04/28/18 1115    Clinical Impression Statement  Pt reports she will recieved walker tomorrow and she is benefitting from stretching.  hamsting now at 65 on right and left 50 from eval.  Pt still at 6/10 pain level but does feel more flexible.  Pt reports that she had incident where she stumbled yesterday but was able to catch herself.  Pt needs to use walker at all times for safety.  Pt is enthusiatic about exercising.      Rehab Potential  Good    PT Frequency  2x / week    PT Duration  8 weeks    PT Treatment/Interventions  ADLs/Self Care Home Management;Cryotherapy;Electrical Stimulation;Iontophoresis 18m/ml Dexamethasone;Moist Heat;Ultrasound;Traction;Balance training;Therapeutic activities;Functional mobility training;Gait training;Stair training;Neuromuscular re-education;Patient/family education;Manual techniques;Passive range of motion;Dry needling;Taping    PT Next Visit Plan   teach gait walking. stairs, flexibiity hamstring exercises review body mechanics/ posture, balance     PT Home Exercise Plan  posture/ body mechanics, hamstring stretch, calf stretch, tandem stance , green band clam seated or hooklying standing step ups and 4 way SLR, bridge and quadriped cat cow and alternating legs,  cone touching with tandem and feet narrow. ball throwing.    Consulted and Agree with Plan of Care  Patient        Patient will benefit from skilled therapeutic intervention in order to improve the following deficits and impairments:  Abnormal gait, Decreased balance, Decreased range of motion, Decreased mobility, Decreased strength, Difficulty walking, Hypomobility, Increased muscle spasms, Impaired sensation, Postural dysfunction, Improper body mechanics, Pain, Impaired flexibility  Visit Diagnosis: Chronic bilateral low back pain  with sciatica, sciatica laterality unspecified  Muscle weakness (generalized)  Cramp and spasm  Difficulty in walking, not elsewhere classified  Unsteadiness on feet  Repeated falls     Problem List There are no active problems to display for this patient.   Voncille Lo, PT Certified Exercise Expert for the Aging Adult  04/28/18 11:20 AM Phone: [phone] Fax: [facility] [address] [place], [place], [address] Phone: [phone]   Fax:  [phone]  Name: YVETTA DROTAR MRN: [id number] Date of Birth: February 12, 1986

## 2018-04-28 NOTE — Patient Instructions (Addendum)
Knee High   Holding stable object, raise knee to hip level, then lower knee. Repeat with other knee. Complete __15_ repetitions. Do __1__ sessions per day.  ABDUCTION: Standing (Active)   Stand, feet flat. Lift right leg out to side. Use _0__ lbs. Complete __15_ repetitions. Perform __1_ sessions per day.    EXTENSION: Standing (Active)  Stand, both feet flat. Draw right leg behind body as far as possible. Use 0___ lbs. Complete 15 repetitions. Perform __1_ sessions per day.    Copyright  VHI. All rights reserved.  Step-Up: Lateral   Step up to side with right leg. Bring other foot up onto __6__ inch step. Return to floor position with left leg. Repeat _15___ times per session. Do _1___ sessions per day. Repeat in dimly lit room. Repeat with eyes closed.  Copyright  VHI. All rights reserved.  Forward   Facing step, place one leg on step, flexed at hip. Step up slowly, bringing hips in line with knee and shoulder. Bring other foot onto step. Reverse process to step back down. Repeat with other leg. Do __15__ repetitions, _1___ sets.           Gina Riley, PT Certified Exercise Expert for the Aging Adult  04/28/18 10:58 AM Phone: [phone] Fax: [phone]

## 2018-04-30 ENCOUNTER — Ambulatory Visit: Payer: Medicare Other | Admitting: Physical Therapy

## 2018-05-01 ENCOUNTER — Ambulatory Visit: Payer: Medicare Other | Admitting: Physical Therapy

## 2018-05-01 ENCOUNTER — Encounter: Payer: Self-pay | Admitting: Physical Therapy

## 2018-05-01 DIAGNOSIS — M5440 Lumbago with sciatica, unspecified side: Principal | ICD-10-CM

## 2018-05-01 DIAGNOSIS — R252 Cramp and spasm: Secondary | ICD-10-CM

## 2018-05-01 DIAGNOSIS — M6281 Muscle weakness (generalized): Secondary | ICD-10-CM

## 2018-05-01 DIAGNOSIS — G8929 Other chronic pain: Secondary | ICD-10-CM

## 2018-05-01 DIAGNOSIS — R296 Repeated falls: Secondary | ICD-10-CM

## 2018-05-01 DIAGNOSIS — R262 Difficulty in walking, not elsewhere classified: Secondary | ICD-10-CM

## 2018-05-01 DIAGNOSIS — R2681 Unsteadiness on feet: Secondary | ICD-10-CM

## 2018-05-01 NOTE — Therapy (Signed)
[place], [place], [address] Phone: [phone]   Fax:  [phone]  Physical Therapy Treatment  Patient Details  Name: Gina Riley MRN: [id number] Date of Birth: 1986-01-29 Referring Provider: Melina Schools MD   Encounter Date: 05/01/2018  PT End of Session - 05/01/18 0930    Visit Number  5    Number of Visits  16    Date for PT Re-Evaluation  05/28/18    Authorization Type  Medicare/medicaid  10th visit progress note    PT Start Time  0925    PT Stop Time  1012    PT Time Calculation (min)  47 min       Past Medical History:  Diagnosis Date  . Cerebral palsy [facility])     Past Surgical History:  Procedure Laterality Date  . "rhyzotomy for CP" on spine so she could walk.  age 32 years old  . CESAREAN SECTION      There were no vitals filed for this visit.  Subjective Assessment - 05/01/18 0929    Currently in Pain?  Yes    Pain Score  8     Pain Location  Back    Pain Orientation  Right;Left;Lower    Pain Descriptors / Indicators  Sore    Pain Type  Chronic pain                       [facility] Adult PT Treatment/Exercise - 05/01/18 0001      Neuro Re-ed    Neuro Re-ed Details   ball toss narrow staggered stances. Tandem and SLS trials at chair.       Lumbar Exercises: Standing   Other Standing Lumbar Exercises  one foot on step, hip hikes x 10 each  VC for keeping right knee straight.  compensates      Lumbar Exercises: Seated   Sit to Stand  10 reps      Lumbar Exercises: Quadruped   Madcat/Old Horse  15 reps    Madcat/Old Horse Limitations  x 2 with VC for  neck positons and abd setting    Straight Leg Raise  3 seconds;10 reps      Knee/Hip Exercises: Stretches   Active Hamstring Stretch  Right;Left;3 reps;30 seconds    Active Hamstring Stretch Limitations  seated    Gastroc Stretch  3 reps;30 seconds    Gastroc Stretch Limitations  runners stretch at  counter, cues for hip alignment     Other Knee/Hip Stretches  slant board / edge of step        Knee/Hip Exercises: Standing   Lateral Step Up  10 reps;Step Height: 6";Hand Hold: 1;2 sets    Forward Step Up  10 reps;Hand Hold: 1;Step Height: 6";2 sets    Functional Squat  15 reps;1 set    Functional Squat Limitations  sink holding    Other Standing Knee Exercises  forward walking with head turns       Knee/Hip Exercises: Supine   Constance Haw  --    Bridges with Clamshell  10 reps green    Other Supine Knee/Hip Exercises  clams green band eccentric focus               PT Short Term Goals - 04/28/18 1019      PT SHORT TERM GOAL #1   Title  Pt will be independent with initial HEP    Baseline  Pt given intial  standing and neuromuscular HEP    Time  4    Period  Weeks    Status  On-going      PT SHORT TERM GOAL #2   Title  Pt will be tested on BERG and  LTG set    Baseline  48/56    Time  4    Period  Weeks    Status  Achieved      PT SHORT TERM GOAL #3   Title  Pt will increase hamstring flexibility to at least 60 degrees to reduce back pain to at least 4/10 or less    Baseline  65 degrees right  6/10 left 50    Time  4    Period  Weeks    Status  Partially Met      PT SHORT TERM GOAL #4   Title  Pt will be educated on proper use of walker to decrease fall risk and be independent with home use    Baseline  Pt getting walker tomorrow from [facility]    Time  4    Period  Weeks    Status  On-going        PT Long Term Goals - 04/28/18 1103      PT LONG TERM GOAL #1   Title  "Demonstrate and verbalize techniques to reduce the risk of re-injury including: lifting, posture, body mechanics.     Baseline  given written and intiial teaching    Time  8    Period  Weeks    Status  On-going      PT LONG TERM GOAL #2   Title  "Pt will be independent with advanced HEP.     Baseline  Pt given initial HEP    Time  8    Period  Weeks    Status  On-going      PT LONG  TERM GOAL #3   Title  "Pt will not wake due to pain while turning in bed while sleeping at night to recieve at least 4 hours of uninterrupted sleep at night    Baseline  Using 2 pillows at night  3 hours    Time  8    Period  Weeks    Status  On-going      PT LONG TERM GOAL #4   Title  Pt will be evaluatated by BERG and will work on balance issues in order to be able to throw ball safely to 33 year old son without falls    Baseline  working on ball toss and cone touncing in standing. and tandem stance    Time  8    Period  Weeks    Status  On-going      PT LONG TERM GOAL #5   Title  Pt will be able to perform household chores with proper body mechanics to reduce pain to at least  =/< 3/10     Baseline  6/10 today    Period  Weeks    Status  On-going      PT LONG TERM GOAL #6   Title  "FOTO will improve from 39% limitation    to 34% limitation    indicating improved functional mobility.     Time  8    Period  Weeks    Status  Unable to assess      PT LONG TERM GOAL #7   Title  "Pt will improve her L/R knee flexion to  >/=  125 degrees and extension to </= 3 degrees with </= 3/10 pain for a more functional and efficient gait pattern    Baseline  115            Plan - 05/01/18 1013    Clinical Impression Statement  Pt reports her boyfriend her some bars to hold onto while she does her balance exercises. Slant board stretch decreased LBP today. Continued balance challenges and LE strengthening for progression toward goals.     PT Next Visit Plan   teach gait walking. stairs, flexibiity hamstring exercises review body mechanics/ posture, balance     PT Home Exercise Plan  posture/ body mechanics, hamstring stretch, calf stretch, tandem stance , green band clam seated or hooklying standing step ups and 4 way SLR, bridge and quadriped cat cow and alternating legs,  cone touching with tandem and feet narrow. ball throwing.    Consulted and Agree with Plan of Care  Patient        Patient will benefit from skilled therapeutic intervention in order to improve the following deficits and impairments:  Abnormal gait, Decreased balance, Decreased range of motion, Decreased mobility, Decreased strength, Difficulty walking, Hypomobility, Increased muscle spasms, Impaired sensation, Postural dysfunction, Improper body mechanics, Pain, Impaired flexibility  Visit Diagnosis: Chronic bilateral low back pain with sciatica, sciatica laterality unspecified  Muscle weakness (generalized)  Cramp and spasm  Difficulty in walking, not elsewhere classified  Unsteadiness on feet  Repeated falls     Problem List There are no active problems to display for this patient.   Dorene Ar, [place] 05/01/2018, 10:16 AM  [facility] [address] [place], [place], [address] Phone: [phone]   Fax:  [phone]  Name: Gina Riley MRN: [id number] Date of Birth: 1986/08/14

## 2018-05-05 ENCOUNTER — Ambulatory Visit: Payer: Medicare Other | Admitting: Physical Therapy

## 2018-05-07 ENCOUNTER — Ambulatory Visit: Payer: Medicare Other | Admitting: Physical Therapy

## 2018-05-12 ENCOUNTER — Telehealth: Payer: Self-pay | Admitting: Physical Therapy

## 2018-05-12 ENCOUNTER — Ambulatory Visit: Payer: Medicare Other | Admitting: Physical Therapy

## 2018-05-12 NOTE — Telephone Encounter (Signed)
Left message on patient's voicemail regarding 2 consecutive no shows to PT appointments on 05/07/2018 and 05/12/2018. Left her next appointment time and asked her to call if she cannot attend.

## 2018-05-14 ENCOUNTER — Ambulatory Visit: Payer: Medicare Other | Admitting: Physical Therapy

## 2018-05-14 ENCOUNTER — Telehealth: Payer: Self-pay | Admitting: Physical Therapy

## 2018-05-14 NOTE — Telephone Encounter (Signed)
Pt was left message after No show x 3 . Pt appt taken off schedule except for 05-19-18.  Pt was reminded of attendance policy and will need to make appt one at a time from now on.  Pt was also encouraged to call clinic and let us know if she no longer wants PT at this time.  Pt was reminded of her one appt on 05-19-18  Garen Lah, PT Certified Exercise Expert for the Aging Adult  05/14/18 10:41 AM Phone: [phone] Fax: [phone]

## 2018-05-19 ENCOUNTER — Encounter: Payer: Medicare Other | Admitting: Physical Therapy

## 2018-05-19 ENCOUNTER — Ambulatory Visit: Payer: Medicare Other | Attending: Orthopedic Surgery | Admitting: Physical Therapy

## 2018-05-19 ENCOUNTER — Encounter: Payer: Self-pay | Admitting: Physical Therapy

## 2018-05-19 DIAGNOSIS — G8929 Other chronic pain: Secondary | ICD-10-CM | POA: Insufficient documentation

## 2018-05-19 DIAGNOSIS — R2681 Unsteadiness on feet: Secondary | ICD-10-CM

## 2018-05-19 DIAGNOSIS — R252 Cramp and spasm: Secondary | ICD-10-CM | POA: Insufficient documentation

## 2018-05-19 DIAGNOSIS — R262 Difficulty in walking, not elsewhere classified: Secondary | ICD-10-CM | POA: Insufficient documentation

## 2018-05-19 DIAGNOSIS — M5440 Lumbago with sciatica, unspecified side: Secondary | ICD-10-CM | POA: Insufficient documentation

## 2018-05-19 DIAGNOSIS — M6281 Muscle weakness (generalized): Secondary | ICD-10-CM | POA: Diagnosis present

## 2018-05-19 DIAGNOSIS — R296 Repeated falls: Secondary | ICD-10-CM

## 2018-05-19 NOTE — Patient Instructions (Signed)
  Do 2 sets of 10 of each of the exercises above for right and left side  Garen LahLawrie Arron Tetrault, PT Certified Exercise Expert for the Aging Adult  05/19/18 10:48 AM Phone: [phone] Fax: [phone]

## 2018-05-19 NOTE — Therapy (Signed)
[place], [place], [address] Phone: [phone]   Fax:  [phone]  Physical Therapy Treatment/Recertificaton  Patient Details  Name: Gina Riley MRN: [id number] Date of Birth: [birth year] Referring Provider: Melina Schools MD   Encounter Date: 05/19/2018  PT End of Session - 05/19/18 1015    Visit Number  6    Number of Visits  12    Date for PT Re-Evaluation  06/30/18    Authorization Type  Medicare/medicaid  10th visit progress note    Authorization - Visit Number  6    Authorization - Number of Visits  16    PT Start Time  1015    PT Stop Time  1100    PT Time Calculation (min)  45 min    Activity Tolerance  Patient tolerated treatment well    Behavior During Therapy  [facility] for tasks assessed/performed       Past Medical History:  Diagnosis Date  . Cerebral palsy [facility])     Past Surgical History:  Procedure Laterality Date  . "rhyzotomy for CP" on spine so she could walk.  age 32 years old  . CESAREAN SECTION      There were no vitals filed for this visit.  Subjective Assessment - 05/19/18 1028    Subjective  I have been having trouble getting to PT due to family emergencies and transportation and [place] bus.  I have an appt at [facility] on May 21, 2018 for nerve conduction studies.  I did get my rollator to walk with full time and it helps with my falls. I do my exercise when I miss therapy. I really feel it has helped and I am trying to go to [employer] and join.      Pertinent History   [place]. 7 falls in last 6 months rhizotomy for CP in order to walk at age [age],  ALLERGIES to PCN and Codeine    Limitations  Sitting;Standing;Walking;House hold activities    How long can you sit comfortably?  90 minutes to 2 hours    How long can you stand comfortably?  30 to 45 minutes    How long can you walk comfortably?  30 to 45 minutes     Diagnostic tests  going to [facility] on June 6 for NCV, EMG    Patient Stated Goals  to improve my mobility and prevent falls, and get stronger to be more mobile, Be able to be more active/play with my 33 year old son.  throw ball with my son    Currently in Pain?  Yes    Pain Score  5     Pain Location  Back    Pain Orientation  Right;Left    Pain Descriptors / Indicators  Tightness;Sore numbness in left leg    Pain Type  Chronic pain    Pain Onset  More than a month ago    Pain Frequency  Intermittent         [facility] PT Assessment - 05/19/18 1038      Observation/Other Assessments   Focus on Therapeutic Outcomes (FOTO)   FOTO intake 57% limitation 43% predicted 39%      AROM   Right Hip Extension  -10    Right Hip Flexion  98    Right Hip External Rotation   48    Right Hip Internal Rotation   30  Right Hip ABduction  43    Left Hip Extension  -10    Left Hip Flexion  95    Left Hip External Rotation   40    Left Hip Internal Rotation   32    Left Hip ABduction  31    Right Knee Extension  4    Right Knee Flexion  115    Left Knee Extension  0    Left Knee Flexion  116    Right Ankle Dorsiflexion  5 Pt with CP spasticity    Left Ankle Dorsiflexion  0 Pt with CP spasticity    Lumbar Flexion  50 pain low back only tries to bend knees    Lumbar Extension  20 pain in mid back    Lumbar - Right Side Bend  22    Lumbar - Left Side Bend  25    Lumbar - Right Rotation  80% available range    Lumbar - Left Rotation  70-% available range      Strength   Overall Strength  Deficits    Right Hip Flexion  4/5    Right Hip Extension  4-/5    Right Hip ABduction  4/5    Left Hip Flexion  4/5    Left Hip Extension  3+/5    Left Hip ABduction  3+/5 compensates with hip flexor    Right Knee Flexion  4-/5    Right Knee Extension  4-/5    Left Knee Flexion  4/5    Left Knee Extension  4-/5      Flexibility   Hamstrings  right 65, left 50 spasticity      Standardized Balance  Assessment   Standardized Balance Assessment  Berg Balance Test      Berg Balance Test   Sit to Stand  Able to stand without using hands and stabilize independently    Standing Unsupported  Able to stand safely 2 minutes    Sitting with Back Unsupported but Feet Supported on Floor or Stool  Able to sit safely and securely 2 minutes    Stand to Sit  Sits safely with minimal use of hands    Transfers  Able to transfer safely, minor use of hands    Standing Unsupported with Eyes Closed  Able to stand 10 seconds safely    Standing Ubsupported with Feet Together  Able to place feet together independently and stand 1 minute safely    From Standing, Reach Forward with Outstretched Arm  Can reach confidently >25 cm (10")    From Standing Position, Pick up Object from Floor  Able to pick up shoe safely and easily    From Standing Position, Turn to Look Behind Over each Shoulder  Looks behind from both sides and weight shifts well    Turn 360 Degrees  Able to turn 360 degrees safely in 4 seconds or less in 6 seconds    Standing Unsupported, Alternately Place Feet on Step/Stool  Able to stand independently and safely and complete 8 steps in 20 seconds Pt has been working on Higher education careers adviser Unsupported, One Foot in [employer] to plae foot ahead of the other independently and hold 30 seconds    Standing on One Leg  Able to lift leg independently and hold equal to or more than 3 seconds right 10 sec, left 2    Total Score  53    Berg comment:  improved 5 points but  must use walker /rolator at all times for safety                   [facility] Adult PT Treatment/Exercise - 05/19/18 1020      Ambulation/Gait   Assistive device  Rollator    Gait Pattern  Scissoring;Left flexed knee in stance;Trunk rotated posteriorly on right;Decreased weight shift to right;Poor foot clearance - left;Abducted - left    Ambulation Surface  Level    Gait Comments  Pt must use rollator at all times      Lumbar  Exercises: Quadruped   Straight Leg Raise  10 reps;3 seconds x 2 r and l    Other Quadruped Lumbar Exercises  hip knee ext, 90/90 r and left 2 x 10,       Knee/Hip Exercises: Standing   Lateral Step Up  10 reps;Step Height: 6";Hand Hold: 1;2 sets    Forward Step Up  10 reps;Hand Hold: 1;Step Height: 6";2 sets    Other Standing Knee Exercises  monster walks without t band for 20 feet     Other Standing Knee Exercises  10 lb goblet squats ,2 cx 10      Knee/Hip Exercises: Supine   Bridges  Strengthening;Both;2 sets;10 reps    Bridges Limitations  single limb bridge on r and left 2 x 10 each      Knee/Hip Exercises: Sidelying   Clams  2 x 10 righ and left red t band              PT Education - 05/19/18 1030    Education provided  Yes    Education Details  added hip strengthening exercises and did recertification    Person(s) Educated  Patient    Methods  Explanation;Demonstration;Tactile cues;Verbal cues;Handout    Comprehension  Verbalized understanding;Returned demonstration       PT Short Term Goals - 05/19/18 1136      PT SHORT TERM GOAL #1   Title  Pt will be independent with initial HEP    Baseline  Pt given intial standing and neuromuscular HEP    Time  4    Period  Weeks    Status  Achieved      PT SHORT TERM GOAL #2   Title  Pt will be tested on BERG and  LTG set    Baseline  48/56  53/56 on 05-19-18    Time  4    Period  Weeks    Status  Achieved      PT SHORT TERM GOAL #3   Title  Pt will increase hamstring flexibility to at least 60 degrees to reduce back pain to at least 4/10 or less    Baseline  65 degrees right  6/10 left 50    Time  4    Period  Weeks    Status  Partially Met      PT SHORT TERM GOAL #4   Title  Pt will be educated on proper use of walker to decrease fall risk and be independent with home use    Baseline  Pt now has rollator and is improved with walking  I with home use    Time  4    Period  Weeks    Status  Achieved         PT Long Term Goals - 05/19/18 1139      PT LONG TERM GOAL #1   Title  "Demonstrate and verbalize techniques to reduce  the risk of re-injury including: lifting, posture, body mechanics.     Baseline  given written and intiial teaching, needs reinforcement    Time  6    Period  Weeks    Status  On-going    Target Date  06/30/18      PT LONG TERM GOAL #2   Title  "Pt will be independent with advanced HEP.     Baseline  Pt working on advanced HEP    Time  6    Period  Weeks    Status  New    Target Date  06/30/18      PT LONG TERM GOAL #3   Title  "Pt will not wake due to pain while turning in bed while sleeping at night to recieve at least 4 hours of uninterrupted sleep at night    Baseline  Pt using better positioning but stilll wakes at night every 3 hours.      Time  6    Period  Weeks    Status  On-going    Target Date  06/30/18      PT LONG TERM GOAL #4   Title  Pt will be evaluatated by BERG and will work on balance issues in order to be able to throw ball safely to 26 year old son without falls    Baseline  PT BERG 53/56 today,using rollator at all times, improving but needs reinforcement    Time  6    Period  Weeks    Status  On-going      PT LONG TERM GOAL #5   Title  Pt will be able to perform household chores with proper body mechanics to reduce pain to at least  =/< 3/10     Baseline  5/10 today    Time  6    Period  Weeks    Status  On-going      PT LONG TERM GOAL #6   Title  "FOTO will improve from 39% limitation    to 34% limitation    indicating improved functional mobility.     Baseline  Pt FOTO today with 43 % limitaiton today 05-19-18    Time  6    Period  Weeks    Status  On-going    Target Date  06/30/18      PT LONG TERM GOAL #7   Title  "Pt will improve her L/R knee flexion to  >/= 125 degrees and extension to </= 3 degrees with </= 3/10 pain for a more functional and efficient gait pattern    Baseline  Pt 5/10 pain,   right 115, left 116     Time  6    Period  Weeks    Status  On-going      PT LONG TERM GOAL #8   Title  "Pt will tolerate walking for 90 minutes without increased pain > than 3/10  in order to return to PLOF before exacerbating back pain    Baseline  Pt can walk 30 to 45 minutes with 5/10 pain    Period  Weeks    Status  On-going      PT LONG TERM GOAL  #9   TITLE  Pt will be able to negotiate steps safely with falls prevention information and to be able to climb apartment 13 steps with 50% greater ease    Baseline  Pt still requires extra time and has hip ext abd weakness making stair climibing  difficult.  need s reinforcement    Time  6    Period  Weeks    Status  On-going      PT LONG TERM GOAL  #10   TITLE  Pt will be educated on community wellness options and choose one to particitpate after DC     Baseline  Pt with inital HEP and needs further strengthening    Time  6    Period  Weeks    Status  New    Target Date  06/30/18            Plan - 05/19/18 1049    Clinical Impression Statement  Pt presents today using brand new rollator and commenting that she has decreased falls.  Pt recertified for 6 addtional visit since recertification is to expire.  Pt has has 4 no shows and several cancellations due to family emergencies, transportation issues(takes city bus) and she fainted.  Pt BERG has improvedfrom 48 to 53/56. but due to spasticity and CP must use rollator/ walker at all times.  Pt has made several improvements but is also pursuing further workup and evaluation with NCV/ EMG at [facility] on May 21, 2018.  Pt would like to contineu PT but reduced times a week to be sure to make appt due to transportation issues and to complete goals.  Pt would benefit form 1 x a week for next 6 weeks to maximize function and decrease fall risk    Rehab Potential  Good    PT Frequency  1x / week    PT Duration  6 weeks    PT Treatment/Interventions  ADLs/Self Care Home Management;Cryotherapy;Electrical  Stimulation;Iontophoresis 70m/ml Dexamethasone;Moist Heat;Ultrasound;Traction;Balance training;Therapeutic activities;Functional mobility training;Gait training;Stair training;Neuromuscular re-education;Patient/family education;Manual techniques;Passive range of motion;Dry needling;Taping    PT Next Visit Plan   teach gait walking. stairs, flexibiity hamstring exercises review body mechanics/ posture, balance     PT Home Exercise Plan  posture/ body mechanics, hamstring stretch, calf stretch, tandem stance , green band clam seated or hooklying standing step ups and 4 way SLR, bridge and quadriped cat cow and alternating legs,  cone touching with tandem and feet narrow. ball throwing.    Consulted and Agree with Plan of Care  Patient       Patient will benefit from skilled therapeutic intervention in order to improve the following deficits and impairments:  Abnormal gait, Decreased balance, Decreased range of motion, Decreased mobility, Decreased strength, Difficulty walking, Hypomobility, Increased muscle spasms, Impaired sensation, Postural dysfunction, Improper body mechanics, Pain, Impaired flexibility  Visit Diagnosis: Chronic bilateral low back pain with sciatica, sciatica laterality unspecified - Plan: PT plan of care cert/re-cert  Muscle weakness (generalized) - Plan: PT plan of care cert/re-cert  Cramp and spasm - Plan: PT plan of care cert/re-cert  Difficulty in walking, not elsewhere classified - Plan: PT plan of care cert/re-cert  Unsteadiness on feet - Plan: PT plan of care cert/re-cert  Repeated falls - Plan: PT plan of care cert/re-cert     Problem List There are no active problems to display for this patient.   LVoncille Lo PT Certified Exercise Expert for the Aging Adult  05/19/18 11:48 AM Phone: [phone]: [phone] [facility] [address]Phone: [phone]  Fax:   [phone] Name: Gina CRYDERMRN: [id number] of Birth: 609/03/1986

## 2018-05-21 ENCOUNTER — Encounter: Payer: Medicare Other | Admitting: Physical Therapy

## 2018-06-02 ENCOUNTER — Other Ambulatory Visit: Payer: Self-pay

## 2018-06-02 ENCOUNTER — Ambulatory Visit: Payer: Medicare Other | Admitting: Physical Therapy

## 2018-06-02 ENCOUNTER — Encounter: Payer: Self-pay | Admitting: Physical Therapy

## 2018-06-02 DIAGNOSIS — R262 Difficulty in walking, not elsewhere classified: Secondary | ICD-10-CM

## 2018-06-02 DIAGNOSIS — G8929 Other chronic pain: Secondary | ICD-10-CM

## 2018-06-02 DIAGNOSIS — M5440 Lumbago with sciatica, unspecified side: Principal | ICD-10-CM

## 2018-06-02 DIAGNOSIS — M6281 Muscle weakness (generalized): Secondary | ICD-10-CM

## 2018-06-02 DIAGNOSIS — R296 Repeated falls: Secondary | ICD-10-CM

## 2018-06-02 DIAGNOSIS — R2681 Unsteadiness on feet: Secondary | ICD-10-CM

## 2018-06-02 DIAGNOSIS — R252 Cramp and spasm: Secondary | ICD-10-CM

## 2018-06-02 NOTE — Patient Instructions (Signed)
        Garen LahLawrie Beardsley, PT Certified Exercise Expert for the Aging Adult  06/02/18 10:05 AM Phone: [phone] Fax: [phone]

## 2018-06-02 NOTE — Therapy (Deleted)
[facility] [address] [place], [place], [address] Phone: [phone]   Fax:  [phone]  June 02, 2018   @[email or web address] @  Physical Therapy Discharge Summary  Patient: Gina Riley  MRN: [id number]  Date of Birth: 30-Aug-1986   Diagnosis: Chronic bilateral low back pain with sciatica, sciatica laterality unspecified  Muscle weakness (generalized)  Cramp and spasm  Difficulty in walking, not elsewhere classified  Unsteadiness on feet  Repeated falls Referring Provider: Venita LickBrooks, Dahari MD   The above patient had been seen in Physical Therapy *** times of *** treatments scheduled with *** no shows and *** cancellations.  The treatment consisted of *** The patient is: {improved/worse/unchanged:3041574}  Subjective: ***  Discharge Findings: ***  Functional Status at Discharge: ***  {UVOZD:6644034}{GOALS:3041575}  Plan - 06/02/18 1013    Rehab Potential  Good    PT Frequency  1x / week    PT Duration  6 weeks    PT Next Visit Plan   teach gait walking. stairs, review standing hip HEP from last week. with red t band flexibiity hamstring exercises review body mechanics/ posture, balance     PT Home Exercise Plan  posture/ body mechanics, hamstring stretch, calf stretch, tandem stance , green band clam seated or hooklying standing step ups and 4 way SLR, bridge and quadriped cat cow and alternating legs,  cone touching with tandem and feet narrow. ball throwing. monster walks forward and side stepping with red t band.  left /right hip abd isometric standing into wall and hip abd sgtanding with red t band . all holding onto chair for balance    Consulted and Agree with Plan of Care  Patient       Sincerely,   Jenelle MagesBeardsley, Patrece Tallie S, PT   CC @[email or web address] @  [facility] [address] [place], [place], [address] Phone: [phone]   Fax:  [phone]  Patient: Gina GalaKatrice D  Riley  MRN: [id number]  Date of Birth: 30-Aug-1986

## 2018-06-02 NOTE — Therapy (Signed)
[place], [place], [address] Phone: [phone]   Fax:  [phone]  Physical Therapy Treatment  Patient Details  Name: Gina Riley MRN: [id number] Date of Birth: 05/09/1986 Referring Provider: Melina Schools MD   Encounter Date: 06/02/2018  PT End of Session - 06/02/18 1722    Visit Number  7    Number of Visits  12    Date for PT Re-Evaluation  06/30/18    Authorization Type  Medicare/medicaid  10th visit progress note    Authorization - Visit Number  7    Authorization - Number of Visits  16    PT Start Time  0935    PT Stop Time  8338    PT Time Calculation (min)  39 min       Past Medical History:  Diagnosis Date  . Cerebral palsy [facility])     Past Surgical History:  Procedure Laterality Date  . "rhyzotomy for CP" on spine so she could walk.  age 32 years old  . CESAREAN SECTION      There were no vitals filed for this visit.  Subjective Assessment - 06/02/18 0940    Subjective  I had to walk with my rollator from [employer] from [employer] so I wouldnt be late. I cooked Fathers Day and I couldnt feel my legs in my pelvis to my knees.  Pt is not having symptoms now but was told to call MD and relay information.  Pt was educated on medical emergency or loss of bowels and bladder and now feeling.s    Pertinent History   Precautians. 7 falls in last 6 months rhizotomy for CP in order to walk at age 20-6,  ALLERGIES to PCN and Codeine    Patient Stated Goals  to improve my mobility and prevent falls, and get stronger to be more mobile, Be able to be more active/play with my 74 year old son.  throw ball with my son    Currently in Pain?  Yes    Pain Score  4     Pain Location  Back    Pain Orientation  Right;Left    Pain Descriptors / Indicators  Tightness;Sore    Pain Type  Chronic pain    Pain Onset  More than a month ago                       [facility] Adult PT  Treatment/Exercise - 06/02/18 0953      Ambulation/Gait   Assistive device  Rollator    Gait Pattern  Scissoring;Left flexed knee in stance;Trunk rotated posteriorly on right;Decreased weight shift to right;Poor foot clearance - left;Abducted - left    Ambulation Surface  Level    Stairs  Yes    Stair Management Technique  Two rails;Alternating pattern    Number of Stairs  16    Height of Stairs  6    Gait Comments  Pt must use rollator at all times      Self-Care   Self-Care  Other Self-Care Comments    Other Self-Care Comments   discussed medical emergencies and what saddle bag anesthesia is and necessity to inform MD of symptoms in the event that they occur.  Pt verbalized understanding.      Knee/Hip Exercises: Standing   Hip Abduction  10 reps;2 sets;Both with ball isometric into wall into ball    Lateral Step Up  15 reps;2  sets;Left;Right;Hand Hold: 2    Forward Step Up  Both;15 reps;2 sets;Hand Hold: 2    Functional Squat  15 reps;1 set    Functional Squat Limitations  llbars pt difficulty keeping heels flat due to spaticity    Other Standing Knee Exercises  monster walks with red t band in llbars 15 feet to right and left x 3 each ; standeing knee with ball to wall for left side glut activation 10 sec hold x 10     Other Standing Knee Exercises  forward walk with red t band in llbars 15 feet x 4      Knee/Hip Exercises: Supine   Bridges  Strengthening;Both;2 sets;10 reps    Bridges Limitations  single limb bridge on r and left 2 x 10 each             PT Education - 06/02/18 1005    Education provided  Yes    Education Details  added standing banded exercises for left hip  with handout    Person(s) Educated  Patient    Methods  Explanation;Tactile cues;Verbal cues;Demonstration;Handout    Comprehension  Verbalized understanding;Returned demonstration       PT Short Term Goals - 05/19/18 1136      PT SHORT TERM GOAL #1   Title  Pt will be independent with  initial HEP    Baseline  Pt given intial standing and neuromuscular HEP    Time  4    Period  Weeks    Status  Achieved      PT SHORT TERM GOAL #2   Title  Pt will be tested on BERG and  LTG set    Baseline  48/56  53/56 on 05-19-18    Time  4    Period  Weeks    Status  Achieved      PT SHORT TERM GOAL #3   Title  Pt will increase hamstring flexibility to at least 60 degrees to reduce back pain to at least 4/10 or less    Baseline  65 degrees right  6/10 left 50    Time  4    Period  Weeks    Status  Partially Met      PT SHORT TERM GOAL #4   Title  Pt will be educated on proper use of walker to decrease fall risk and be independent with home use    Baseline  Pt now has rollator and is improved with walking  I with home use    Time  4    Period  Weeks    Status  Achieved        PT Long Term Goals - 05/19/18 1139      PT LONG TERM GOAL #1   Title  "Demonstrate and verbalize techniques to reduce the risk of re-injury including: lifting, posture, body mechanics.     Baseline  given written and intiial teaching, needs reinforcement    Time  6    Period  Weeks    Status  On-going    Target Date  06/30/18      PT LONG TERM GOAL #2   Title  "Pt will be independent with advanced HEP.     Baseline  Pt working on advanced HEP    Time  6    Period  Weeks    Status  New    Target Date  06/30/18      PT LONG TERM GOAL #3  Title  "Pt will not wake due to pain while turning in bed while sleeping at night to recieve at least 4 hours of uninterrupted sleep at night    Baseline  Pt using better positioning but stilll wakes at night every 3 hours.      Time  6    Period  Weeks    Status  On-going    Target Date  06/30/18      PT LONG TERM GOAL #4   Title  Pt will be evaluatated by BERG and will work on balance issues in order to be able to throw ball safely to 59 year old son without falls    Baseline  PT BERG 53/56 today,using rollator at all times, improving but needs  reinforcement    Time  6    Period  Weeks    Status  On-going      PT LONG TERM GOAL #5   Title  Pt will be able to perform household chores with proper body mechanics to reduce pain to at least  =/< 3/10     Baseline  5/10 today    Time  6    Period  Weeks    Status  On-going      PT LONG TERM GOAL #6   Title  "FOTO will improve from 39% limitation    to 34% limitation    indicating improved functional mobility.     Baseline  Pt FOTO today with 43 % limitaiton today 05-19-18    Time  6    Period  Weeks    Status  On-going    Target Date  06/30/18      PT LONG TERM GOAL #7   Title  "Pt will improve her L/R knee flexion to  >/= 125 degrees and extension to </= 3 degrees with </= 3/10 pain for a more functional and efficient gait pattern    Baseline  Pt 5/10 pain,   right 115, left 116    Time  6    Period  Weeks    Status  On-going      PT LONG TERM GOAL #8   Title  "Pt will tolerate walking for 90 minutes without increased pain > than 3/10  in order to return to PLOF before exacerbating back pain    Baseline  Pt can walk 30 to 45 minutes with 5/10 pain    Period  Weeks    Status  On-going      PT LONG TERM GOAL  #9   TITLE  Pt will be able to negotiate steps safely with falls prevention information and to be able to climb apartment 13 steps with 50% greater ease    Baseline  Pt still requires extra time and has hip ext abd weakness making stair climibing difficult.  need s reinforcement    Time  6    Period  Weeks    Status  On-going      PT LONG TERM GOAL  #10   TITLE  Pt will be educated on community wellness options and choose one to particitpate after DC     Baseline  Pt with inital HEP and needs further strengthening    Time  6    Period  Weeks    Status  New    Target Date  06/30/18            Plan - 06/02/18 1013    Clinical Impression Statement  Pt presents  today after walking from [employer] from [facility] center.  Pt reports falling  backward making Fathers Day dinner when she had not feeling in legs and she reports urinating a little bit in the process.  Pt was explained importance of contacting her doctore with any symptome related to bowel and bladder and loss of sensation.  Pt reports she will contact doctor after PT.  Pt did how ever participate well in PT with no problems. Pt was given advanced exercises to activate proximal hip musculature    Rehab Potential  Good    PT Frequency  1x / week    PT Duration  6 weeks    PT Treatment/Interventions  ADLs/Self Care Home Management;Cryotherapy;Electrical Stimulation;Iontophoresis 28m/ml Dexamethasone;Moist Heat;Ultrasound;Traction;Balance training;Therapeutic activities;Functional mobility training;Gait training;Stair training;Neuromuscular re-education;Patient/family education;Manual techniques;Passive range of motion;Dry needling;Taping    PT Next Visit Plan   teach gait walking. stairs, review standing hip HEP from last week. with red t band flexibiity hamstring exercises review body mechanics/ posture, balance     PT Home Exercise Plan  posture/ body mechanics, hamstring stretch, calf stretch, tandem stance , green band clam seated or hooklying standing step ups and 4 way SLR, bridge and quadriped cat cow and alternating legs,  cone touching with tandem and feet narrow. ball throwing. monster walks forward and side stepping with red t band.  left /right hip abd isometric standing into wall and hip abd sgtanding with red t band . all holding onto chair for balance    Consulted and Agree with Plan of Care  Patient       Patient will benefit from skilled therapeutic intervention in order to improve the following deficits and impairments:  Abnormal gait, Decreased balance, Decreased range of motion, Decreased mobility, Decreased strength, Difficulty walking, Hypomobility, Increased muscle spasms, Impaired sensation, Postural dysfunction, Improper body mechanics, Pain, Impaired  flexibility  Visit Diagnosis: Chronic bilateral low back pain with sciatica, sciatica laterality unspecified  Muscle weakness (generalized)  Cramp and spasm  Difficulty in walking, not elsewhere classified  Unsteadiness on feet  Repeated falls     Problem List There are no active problems to display for this patient.   LVoncille Lo PT Certified Exercise Expert for the Aging Adult  06/02/18 5:32 PM Phone: [phone]: 3[facility] Facility[address]GWest [place] [address]Phone: [phone]  Fax:  [phone] Name: KJERZI TIGERTMRN: [id number] of Birth: [birth year]

## 2018-06-09 ENCOUNTER — Encounter: Payer: Self-pay | Admitting: Physical Therapy

## 2018-06-09 ENCOUNTER — Ambulatory Visit: Payer: Medicare Other | Admitting: Physical Therapy

## 2018-06-09 DIAGNOSIS — M5440 Lumbago with sciatica, unspecified side: Principal | ICD-10-CM

## 2018-06-09 DIAGNOSIS — R2681 Unsteadiness on feet: Secondary | ICD-10-CM

## 2018-06-09 DIAGNOSIS — G8929 Other chronic pain: Secondary | ICD-10-CM

## 2018-06-09 DIAGNOSIS — R262 Difficulty in walking, not elsewhere classified: Secondary | ICD-10-CM

## 2018-06-09 DIAGNOSIS — R252 Cramp and spasm: Secondary | ICD-10-CM

## 2018-06-09 DIAGNOSIS — M6281 Muscle weakness (generalized): Secondary | ICD-10-CM

## 2018-06-09 DIAGNOSIS — R296 Repeated falls: Secondary | ICD-10-CM

## 2018-06-09 NOTE — Therapy (Signed)
[place], [place], [address] Phone: [phone]   Fax:  [phone]  Physical Therapy Treatment  Patient Details  Name: Gina Riley MRN: [id number] Date of Birth: 1986-05-09 Referring Provider: Melina Schools MD   Encounter Date: 06/09/2018  PT End of Session - 06/09/18 1015    Visit Number  8    Number of Visits  12    Date for PT Re-Evaluation  06/30/18    Authorization Type  Medicare/medicaid  10th visit progress note    Authorization - Visit Number  8    Authorization - Number of Visits  16    PT Start Time  [phone]    PT Stop Time  1014    PT Time Calculation (min)  46 min       Past Medical History:  Diagnosis Date  . Cerebral palsy [facility])     Past Surgical History:  Procedure Laterality Date  . "rhyzotomy for CP" on spine so she could walk.  age 32 years old  . CESAREAN SECTION      There were no vitals filed for this visit.  Subjective Assessment - 06/09/18 1013    Currently in Pain?  Yes    Pain Score  5     Pain Location  Leg    Pain Orientation  Lower;Left;Right    Pain Descriptors / Indicators  Tightness    Aggravating Factors   walking alot     Pain Relieving Factors  rest                        [facility] Adult PT Treatment/Exercise - 06/09/18 0001      Knee/Hip Exercises: Stretches   Active Hamstring Stretch  Right;Left;3 reps;30 seconds    Active Hamstring Stretch Limitations  seated    Other Knee/Hip Stretches  slant board / edge of step        Knee/Hip Exercises: Standing   Heel Raises  10 reps and heel raises     Hip Abduction  10 reps;2 sets;Both with ball isometric into wall into ball    Abduction Limitations  also standing hip abduction with red theraband 10 x 2 each side     Lateral Step Up  15 reps;Hand Hold: 1;Step Height: 6"    Forward Step Up  15 reps;Hand Hold: 1    Functional Squat  15 reps;1 set    Functional Squat Limitations  counter      Gait Training  tandem gait in parallel bars     Other Standing Knee Exercises  monster walks with red t band in llbars 15 feet to right and left x 3 each     Other Standing Knee Exercises  forward walk with red t band in llbars 15 feet x 4, hip hikes off edge of step x 10 each -difficult      Knee/Hip Exercises: Supine   Bridges Limitations  single limb bridge on r and left 2 x 10 each      Knee/Hip Exercises: Sidelying   Clams  2 x 10 righ and left unable to use band                PT Short Term Goals - 05/19/18 1136      PT SHORT TERM GOAL #1   Title  Pt will be independent with initial HEP    Baseline  Pt given intial standing and neuromuscular HEP  Time  4    Period  Weeks    Status  Achieved      PT SHORT TERM GOAL #2   Title  Pt will be tested on BERG and  LTG set    Baseline  48/56  53/56 on 05-19-18    Time  4    Period  Weeks    Status  Achieved      PT SHORT TERM GOAL #3   Title  Pt will increase hamstring flexibility to at least 60 degrees to reduce back pain to at least 4/10 or less    Baseline  65 degrees right  6/10 left 50    Time  4    Period  Weeks    Status  Partially Met      PT SHORT TERM GOAL #4   Title  Pt will be educated on proper use of walker to decrease fall risk and be independent with home use    Baseline  Pt now has rollator and is improved with walking  I with home use    Time  4    Period  Weeks    Status  Achieved        PT Long Term Goals - 05/19/18 1139      PT LONG TERM GOAL #1   Title  "Demonstrate and verbalize techniques to reduce the risk of re-injury including: lifting, posture, body mechanics.     Baseline  given written and intiial teaching, needs reinforcement    Time  6    Period  Weeks    Status  On-going    Target Date  06/30/18      PT LONG TERM GOAL #2   Title  "Pt will be independent with advanced HEP.     Baseline  Pt working on advanced HEP    Time  6    Period  Weeks    Status  New    Target  Date  06/30/18      PT LONG TERM GOAL #3   Title  "Pt will not wake due to pain while turning in bed while sleeping at night to recieve at least 4 hours of uninterrupted sleep at night    Baseline  Pt using better positioning but stilll wakes at night every 3 hours.      Time  6    Period  Weeks    Status  On-going    Target Date  06/30/18      PT LONG TERM GOAL #4   Title  Pt will be evaluatated by BERG and will work on balance issues in order to be able to throw ball safely to 18 year old son without falls    Baseline  PT BERG 53/56 today,using rollator at all times, improving but needs reinforcement    Time  6    Period  Weeks    Status  On-going      PT LONG TERM GOAL #5   Title  Pt will be able to perform household chores with proper body mechanics to reduce pain to at least  =/< 3/10     Baseline  5/10 today    Time  6    Period  Weeks    Status  On-going      PT LONG TERM GOAL #6   Title  "FOTO will improve from 39% limitation    to 34% limitation    indicating improved functional mobility.  Baseline  Pt FOTO today with 43 % limitaiton today 05-19-18    Time  6    Period  Weeks    Status  On-going    Target Date  06/30/18      PT LONG TERM GOAL #7   Title  "Pt will improve her L/R knee flexion to  >/= 125 degrees and extension to </= 3 degrees with </= 3/10 pain for a more functional and efficient gait pattern    Baseline  Pt 5/10 pain,   right 115, left 116    Time  6    Period  Weeks    Status  On-going      PT LONG TERM GOAL #8   Title  "Pt will tolerate walking for 90 minutes without increased pain > than 3/10  in order to return to PLOF before exacerbating back pain    Baseline  Pt can walk 30 to 45 minutes with 5/10 pain    Period  Weeks    Status  On-going      PT LONG TERM GOAL  #9   TITLE  Pt will be able to negotiate steps safely with falls prevention information and to be able to climb apartment 13 steps with 50% greater ease    Baseline  Pt still  requires extra time and has hip ext abd weakness making stair climibing difficult.  need s reinforcement    Time  6    Period  Weeks    Status  On-going      PT LONG TERM GOAL  #10   TITLE  Pt will be educated on community wellness options and choose one to particitpate after DC     Baseline  Pt with inital HEP and needs further strengthening    Time  6    Period  Weeks    Status  New    Target Date  06/30/18            Plan - 06/09/18 0962    Clinical Impression Statement  5/10 pain in bilateral calves after running errands yesterday. MD said inflammation from fall into door knob. I will see gynecologist to assess for incontinence on June 27th. I am still have Numbness in pelvis and anterior thighs to knees. Focused review of HEP and standing hip strength. Afterward she reports feeling less pain in calves.     PT Next Visit Plan   teach gait walking. stairs, review standing hip HEP from last week. with red t band flexibiity hamstring exercises review body mechanics/ posture, balance     PT Home Exercise Plan  posture/ body mechanics, hamstring stretch, calf stretch, tandem stance , green band clam seated or hooklying standing step ups and 4 way SLR, bridge and quadriped cat cow and alternating legs,  cone touching with tandem and feet narrow. ball throwing. monster walks forward and side stepping with red t band.  left /right hip abd isometric standing into wall and hip abd sgtanding with red t band . all holding onto chair for balance    Consulted and Agree with Plan of Care  Patient       Patient will benefit from skilled therapeutic intervention in order to improve the following deficits and impairments:  Abnormal gait, Decreased balance, Decreased range of motion, Decreased mobility, Decreased strength, Difficulty walking, Hypomobility, Increased muscle spasms, Impaired sensation, Postural dysfunction, Improper body mechanics, Pain, Impaired flexibility  Visit Diagnosis: Chronic  bilateral low back pain with sciatica, sciatica laterality  unspecified  Muscle weakness (generalized)  Cramp and spasm  Difficulty in walking, not elsewhere classified  Repeated falls  Unsteadiness on feet     Problem List There are no active problems to display for this patient.   Hessie Diener [place], [place] 06/09/2018, 10:15 AM  [facility] [address] [place], [place], [address] Phone: [phone]   Fax:  [phone]  Name: Gina Riley MRN: [id number] Date of Birth: [birth year]

## 2018-06-16 ENCOUNTER — Ambulatory Visit: Payer: Medicare Other | Attending: Orthopedic Surgery | Admitting: Physical Therapy

## 2018-06-16 DIAGNOSIS — R296 Repeated falls: Secondary | ICD-10-CM | POA: Insufficient documentation

## 2018-06-16 DIAGNOSIS — R252 Cramp and spasm: Secondary | ICD-10-CM | POA: Insufficient documentation

## 2018-06-16 DIAGNOSIS — M5440 Lumbago with sciatica, unspecified side: Secondary | ICD-10-CM | POA: Insufficient documentation

## 2018-06-16 DIAGNOSIS — G8929 Other chronic pain: Secondary | ICD-10-CM | POA: Insufficient documentation

## 2018-06-16 DIAGNOSIS — R2681 Unsteadiness on feet: Secondary | ICD-10-CM | POA: Insufficient documentation

## 2018-06-16 DIAGNOSIS — M6281 Muscle weakness (generalized): Secondary | ICD-10-CM | POA: Insufficient documentation

## 2018-06-16 DIAGNOSIS — R262 Difficulty in walking, not elsewhere classified: Secondary | ICD-10-CM | POA: Insufficient documentation

## 2018-06-23 ENCOUNTER — Ambulatory Visit: Payer: Medicare Other | Admitting: Physical Therapy

## 2018-06-23 DIAGNOSIS — R2681 Unsteadiness on feet: Secondary | ICD-10-CM | POA: Diagnosis present

## 2018-06-23 DIAGNOSIS — G8929 Other chronic pain: Secondary | ICD-10-CM | POA: Diagnosis present

## 2018-06-23 DIAGNOSIS — M6281 Muscle weakness (generalized): Secondary | ICD-10-CM

## 2018-06-23 DIAGNOSIS — R296 Repeated falls: Secondary | ICD-10-CM

## 2018-06-23 DIAGNOSIS — R262 Difficulty in walking, not elsewhere classified: Secondary | ICD-10-CM | POA: Diagnosis present

## 2018-06-23 DIAGNOSIS — M5440 Lumbago with sciatica, unspecified side: Principal | ICD-10-CM

## 2018-06-23 DIAGNOSIS — R252 Cramp and spasm: Secondary | ICD-10-CM | POA: Diagnosis present

## 2018-06-23 NOTE — Therapy (Signed)
[place], [place], [address] Phone: [phone]   Fax:  [phone]  Physical Therapy Treatment/Discharge Note  Patient Details  Name: Gina Riley MRN: [id number] Date of Birth: 1986/11/29 Referring Provider: Melina Schools MD   Encounter Date: 06/23/2018  PT End of Session - 06/23/18 1039    Visit Number  9    Number of Visits  12    Date for PT Re-Evaluation  06/30/18    Authorization Type  Medicare/medicaid  10th visit progress note    Authorization - Visit Number  9    Authorization - Number of Visits  16    PT Start Time  1017    PT Stop Time  1100    PT Time Calculation (min)  43 min    Activity Tolerance  Patient tolerated treatment well    Behavior During Therapy  [facility] for tasks assessed/performed       Past Medical History:  Diagnosis Date  . Cerebral palsy [facility])     Past Surgical History:  Procedure Laterality Date  . "rhyzotomy for CP" on spine so she could walk.  age 83 years old  . CESAREAN SECTION      There were no vitals filed for this visit.  Subjective Assessment - 06/23/18 1021    Subjective  My son had an asthma attack and I am waiting on my Dad to get him. I have no back pain today but yesterday I woke up with neck pain and tension and I took an Epson salt  bath and stretched and felt better.  I go to [employer] 2 x a week and I will keep doing that.  I am still going to [facility] and i never made it back to [place]. My right foot swells and is sore so they are going to evaluate it    Pertinent History   Precautians. 7 falls in last 6 months rhizotomy for CP in order to walk at age 60-6,  ALLERGIES to PCN and Codeine    Limitations  Sitting;Standing;Walking;House hold activities    Patient Stated Goals  to improve my mobility and prevent falls, and get stronger to be more mobile, Be able to be more active/play with my 33 year old son.  throw ball with my son     Currently in Pain?  No/denies    Pain Score  0-No pain    Pain Location  Back         [facility] PT Assessment - 06/23/18 1036      Observation/Other Assessments   Focus on Therapeutic Outcomes (FOTO)   FOTO intake 70% 30% limitation and 34% predicted      AROM   Overall AROM Comments  Spasticity limits    Right Hip Extension  0    Right Hip Flexion  100    Right Hip External Rotation   48    Right Hip Internal Rotation   30    Right Hip ABduction  43    Left Hip Extension  0    Left Hip Flexion  95    Left Hip External Rotation   40    Left Hip Internal Rotation   35    Left Hip ABduction  38    Right Knee Extension  3    Right Knee Flexion  128 spasticity    Left Knee Extension  0    Left Knee Flexion  126  Right Ankle Dorsiflexion  6 Pt with CP spasticity    Left Ankle Dorsiflexion  5 Pt with CP spasticity    Lumbar Flexion  60    Lumbar Extension  20 no pain    Lumbar - Right Side Bend  22    Lumbar - Left Side Bend  25    Lumbar - Right Rotation  80% available range spasticity affects    Lumbar - Left Rotation  70-% available range spasticity limits      Strength   Overall Strength  Deficits    Right Hip Flexion  4+/5    Right Hip Extension  4/5    Right Hip ABduction  4/5    Left Hip Flexion  4/5    Left Hip Extension  4/5    Left Hip ABduction  4/5 compensates with hip flexor    Right Knee Flexion  4+/5    Right Knee Extension  4+/5    Left Knee Flexion  4+/5    Left Knee Extension  4/5      Ambulation/Gait   Gait Comments  Pt must use rollator at all times                   [facility] Adult PT Treatment/Exercise - 06/23/18 1051      Ambulation/Gait   Gait Comments  Pt must use rollator at all times      Self-Care   Self-Care  Other Self-Care Comments    Other Self-Care Comments   falls prevention review and handout and use of gloves  to protect for potential falls when she walks long distances over unlevel surfaces , must use rollator at all  times and discussion of compression socks and positioning for LE swelling contril      Knee/Hip Exercises: Standing   Heel Raises  10 reps and heel raises     Lateral Step Up  15 reps;Hand Hold: 1;Step Height: 6"    Forward Step Up  15 reps;Hand Hold: 1    Functional Squat  15 reps;1 set    Functional Squat Limitations  counter     Gait Training  tandem gait in parallel bars ; use of clock your self app for propreioception for 3 minutes various numbers bil and single limb trials    Other Standing Knee Exercises  monster walks with green t band in llbars 15 feet to right and left x 3 each ; standing with hip abduction into ball on wall and SLS on opposite leg  2 x 10 right and left hold 3 sec    Other Standing Knee Exercises  standing ball in wall with SLS 10 x 3 sec hold on right and left      Knee/Hip Exercises: Supine   Bridges Limitations  single limb bridge on r and left 2 x 10 each      Knee/Hip Exercises: Sidelying   Clams  1 x 10 on right              PT Education - 06/23/18 1031    Education provided  Yes    Education Details  reveiwed falls prevention with handout and revieweed HEP Pt had grab bars in bathroom installed    Person(s) Educated  Patient    Methods  Explanation;Demonstration;Tactile cues;Verbal cues;Handout       PT Short Term Goals - 05/19/18 1136      PT SHORT TERM GOAL #1   Title  Pt will be independent with  initial HEP    Baseline  Pt given intial standing and neuromuscular HEP    Time  4    Period  Weeks    Status  Achieved      PT SHORT TERM GOAL #2   Title  Pt will be tested on BERG and  LTG set    Baseline  48/56  53/56 on 05-19-18    Time  4    Period  Weeks    Status  Achieved      PT SHORT TERM GOAL #3   Title  Pt will increase hamstring flexibility to at least 60 degrees to reduce back pain to at least 4/10 or less    Baseline  65 degrees right  6/10 left 50    Time  4    Period  Weeks    Status  Partially Met      PT SHORT TERM  GOAL #4   Title  Pt will be educated on proper use of walker to decrease fall risk and be independent with home use    Baseline  Pt now has rollator and is improved with walking  I with home use    Time  4    Period  Weeks    Status  Achieved        PT Long Term Goals - 06/23/18 1027      PT LONG TERM GOAL #1   Title  "Demonstrate and verbalize techniques to reduce the risk of re-injury including: lifting, posture, body mechanics.     Baseline  Pt demonstrates lifting from the ground without UE support and with one UE support    Time  6    Period  Weeks    Status  Achieved      PT LONG TERM GOAL #2   Title  "Pt will be independent with advanced HEP.     Baseline  YEs with HEP reviewed and goes to [employer] 2 x a week    Time  6    Period  Weeks    Status  Achieved      PT LONG TERM GOAL #3   Title  "Pt will not wake due to pain while turning in bed while sleeping at night to recieve at least 4 hours of uninterrupted sleep at night    Baseline  Pt reports sleeping at least 4 hours a night unless son wakes her    Time  6    Period  Weeks    Status  Achieved      PT LONG TERM GOAL #4   Title  Pt will be evaluatated by BERG and will work on balance issues in order to be able to throw ball safely to 38 year old son without falls    Baseline  PT BERG 53/56 in the past Pt is able to toss ball safely without falling and has a chair and with UE support at counter    Time  6    Period  Weeks    Status  Achieved      PT LONG TERM GOAL #5   Title  Pt will be able to perform household chores with proper body mechanics to reduce pain to at least  =/< 3/10     Baseline  0/10 pain in back today    Time  6    Period  Weeks    Status  Achieved      PT LONG TERM GOAL #6  Title  "FOTO will improve from 39% limitation    to 34% limitation    indicating improved functional mobility.     Baseline  FOTO today 30% limitation    Time  6    Period  Weeks    Status  Achieved      PT  LONG TERM GOAL #7   Title  "Pt will improve her L/R knee flexion to  >/= 125 degrees and extension to </= 3 degrees with </= 3/10 pain for a more functional and efficient gait pattern    Baseline  PT with 0/10 pain and  right 125, and left 122      Time  6    Period  Weeks    Status  Partially Met      PT LONG TERM GOAL #8   Title  "Pt will tolerate walking for 90 minutes without increased pain > than 3/10  in order to return to PLOF before exacerbating back pain    Baseline  Pt can walk 2 hours max  no pain today    Time  8    Period  Weeks    Status  Achieved      PT LONG TERM GOAL  #9   TITLE  Pt will be able to negotiate steps safely with falls prevention information and to be able to climb apartment 13 steps with 50% greater ease    Baseline  Pt utilizes bil UE support with rails with at least 50% easier and pt reports faster    Time  6    Period  Weeks    Status  Achieved      PT LONG TERM GOAL  #10   TITLE  Pt will be educated on community wellness options and choose one to particitpate after DC     Baseline  Pt is member at [employer] now 2 x a week    Time  6    Period  Weeks    Status  Achieved            Plan - 06/23/18 1322    Clinical Impression Statement  pt enters clinic with rollator and no complaint of back pain.  She is now going to [employer] 2 x a week and reports feeling steadier on feet.  She does have complaint of right  lower leg swelling.  she spends a lot of time walking over unlevel terrain and caring for her family .  she is cared for [facility] at [facility] and will pursue work up for right LE swelling.  Pt has accomplished all goals STG and LTG and is ready for DC today.  Pt has questrions regarding compression socks for standing long moments.  she says she stands and walks about 2 hours and she feels her exercises have  been able to help her get stronger in her hips .  Pt still deals with fear of falling due to spasticity and she know she should  use rollator at all times and now wears gloves to protect her hands.  During PT she fell one time in the kitchen but today we reviewed Falls lprevention that she has learned over course of RX.  Pt made improvements in MMT and ROM . ( sEe flow sheet)  and accomplished all goals.  Pt will be Discharged due to achieving all goasl and is pleased with current level of functions    Rehab Potential  Good    PT Frequency  1x / week    PT Duration  6 weeks    PT Treatment/Interventions  ADLs/Self Care Home Management;Cryotherapy;Electrical Stimulation;Iontophoresis 18m/ml Dexamethasone;Moist Heat;Ultrasound;Traction;Balance training;Therapeutic activities;Functional mobility training;Gait training;Stair training;Neuromuscular re-education;Patient/family education;Manual techniques;Passive range of motion;Dry needling;Taping    PT Next Visit Plan  DC due to completed goals    PT Home Exercise Plan  posture/ body mechanics, hamstring stretch, calf stretch, tandem stance , green band clam seated or hooklying standing step ups and 4 way SLR, bridge and quadriped cat cow and alternating legs,  cone touching with tandem and feet narrow. ball throwing. monster walks forward and side stepping with red t band.  left /right hip abd isometric standing into wall and hip abd sgtanding with red t band . all holding onto chair for balance    Consulted and Agree with Plan of Care  Patient       Patient will benefit from skilled therapeutic intervention in order to improve the following deficits and impairments:  Abnormal gait, Decreased balance, Decreased range of motion, Decreased mobility, Decreased strength, Difficulty walking, Hypomobility, Increased muscle spasms, Impaired sensation, Postural dysfunction, Improper body mechanics, Pain, Impaired flexibility  Visit Diagnosis: Chronic bilateral low back pain with sciatica, sciatica laterality unspecified  Cramp and spasm  Muscle weakness (generalized)  Difficulty in  walking, not elsewhere classified  Repeated falls  Unsteadiness on feet     Problem List There are no active problems to display for this patient.  LVoncille Lo PT Certified Exercise Expert for the Aging Adult  06/23/18 2:55 PM Phone: [phone]: [phone] [facility] [address]Phone: [phone]  Fax:  [phone] Name: Gina YAKELMRN: [id number] of Birth: [birth year]

## 2018-06-23 NOTE — Patient Instructions (Addendum)
Fall Prevention and Home Safety Falls cause injuries and can affect all age groups. It is possible to use preventive measures to significantly decrease the likelihood of falls. There are many simple measures which can make your home safer and prevent falls. OUTDOORS  Repair cracks and edges of walkways and driveways.  Remove high doorway thresholds.  Trim shrubbery on the main path into your home.  Have good outside lighting.  Clear walkways of tools, rocks, debris, and clutter.  Check that handrails are not broken and are securely fastened. Both sides of steps should have handrails.  Have leaves, snow, and ice cleared regularly.  Use sand or salt on walkways during winter months.  In the garage, clean up grease or oil spills. BATHROOM  Install night lights.  Install grab bars by the toilet and in the tub and shower.  Use non-skid mats or decals in the tub or shower.  Place a plastic non-slip stool in the shower to sit on, if needed.  Keep floors dry and clean up all water on the floor immediately.  Remove soap buildup in the tub or shower on a regular basis.  Secure bath mats with non-slip, double-sided rug tape.  Remove throw rugs and tripping hazards from the floors. BEDROOMS  Install night lights.  Make sure a bedside light is easy to reach.  Do not use oversized bedding.  Keep a telephone by your bedside.  Have a firm chair with side arms to use for getting dressed.  Remove throw rugs and tripping hazards from the floor. KITCHEN  Keep handles on pots and pans turned toward the center of the stove. Use back burners when possible.  Clean up spills quickly and allow time for drying.  Avoid walking on wet floors.  Avoid hot utensils and knives.  Position shelves so they are not too high or low.  Place commonly used objects within easy reach.  If necessary, use a sturdy step stool with a grab bar when reaching.  Keep electrical cables out of the  way.  Do not use floor polish or wax that makes floors slippery. If you must use wax, use non-skid floor wax.  Remove throw rugs and tripping hazards from the floor. STAIRWAYS  Never leave objects on stairs.  Place handrails on both sides of stairways and use them. Fix any loose handrails. Make sure handrails on both sides of the stairways are as long as the stairs.  Check carpeting to make sure it is firmly attached along stairs. Make repairs to worn or loose carpet promptly.  Avoid placing throw rugs at the top or bottom of stairways, or properly secure the rug with carpet tape to prevent slippage. Get rid of throw rugs, if possible.  Have an electrician put in a light switch at the top and bottom of the stairs. OTHER FALL PREVENTION TIPS  Wear low-heel or rubber-soled shoes that are supportive and fit well. Wear closed toe shoes.  When using a stepladder, make sure it is fully opened and both spreaders are firmly locked. Do not climb a closed stepladder.  Add color or contrast paint or tape to grab bars and handrails in your home. Place contrasting color strips on first and last steps.  Learn and use mobility aids as needed. Install an electrical emergency response system.  Turn on lights to avoid dark areas. Replace light bulbs that burn out immediately. Get light switches that glow.  Arrange furniture to create clear pathways. Keep furniture in the same place.    Firmly attach carpet with non-skid or double-sided tape.  Eliminate uneven floor surfaces.  Select a carpet pattern that does not visually hide the edge of steps.  Be aware of all pets. OTHER HOME SAFETY TIPS  Set the water temperature for 120 F (48.8 C).  Keep emergency numbers on or near the telephone.  Keep smoke detectors on every level of the home and near sleeping areas. Document Released: 11/22/2002 Document Revised: 06/02/2012 Document Reviewed: 02/21/2012 [facility] Patient Information 2015  [employer] [place]. This information is not intended to replace advice given to you by your health care provider. Make sure you discuss any questions you have with your health care provider.   Pt reviewed past exercises and modified as indicated on flowsheet Terminal Knee Extension (Standing)    Facing anchor with right knee slightly bent and tubing just above knee, gently pull knee back straight. Do not overextend knee. May use ball to press knee into wall Repeat _15__ times per set. Do _2___ sets per session. Do __1__ sessions per day.  http://orth.exer.us/667   Copyright  VHI. All rights reserved.            Garen LahLawrie Beardsley, PT Certified Exercise Expert for the Aging Adult  06/23/18 10:31 AM Phone: [phone] Fax: [phone]

## 2018-07-22 ENCOUNTER — Encounter (HOSPITAL_COMMUNITY): Payer: Self-pay | Admitting: Emergency Medicine

## 2018-07-22 ENCOUNTER — Other Ambulatory Visit: Payer: Self-pay

## 2018-07-22 ENCOUNTER — Ambulatory Visit (HOSPITAL_COMMUNITY)
Admission: EM | Admit: 2018-07-22 | Discharge: 2018-07-22 | Disposition: A | Discharge status: Home / Self Care | Payer: Medicare Other | Attending: Family Medicine | Admitting: Family Medicine

## 2018-07-22 DIAGNOSIS — T161XXA Foreign body in right ear, initial encounter: Secondary | ICD-10-CM | POA: Diagnosis not present

## 2018-07-22 NOTE — Discharge Instructions (Signed)
Right ear headphone plug removed. No other foreign body seen. No infection of the ear canal. Recheck as needed.

## 2018-07-22 NOTE — ED Provider Notes (Signed)
[facility]    CSN: [id number] Arrival date & time: 07/22/18  1046     History   Chief Complaint Chief Complaint  Patient presents with  . object in ear    right    HPI Gina Riley is a 32 y.o. female.   32 year old female comes in for foreign body in the right ear.  States your phone plug was stuck in the right ear today while on the bus.  Has not been able to remove on her own.  States has had some pressure with mild pain.  Denies drainage.  Denies fever, chills, night sweats.     Past Medical History:  Diagnosis Date  . Cerebral palsy (HCC)     There are no active problems to display for this patient.   Past Surgical History:  Procedure Laterality Date  . "rhyzotomy for CP" on spine so she could walk.  age 32 years old  . CESAREAN SECTION      OB History    Gravida  2   Para  1   Term  1   Preterm  0   AB  1   Living  1     SAB  1   TAB  0   Ectopic  0   Multiple      Live Births  1            Home Medications    Prior to Admission medications   Medication Sig Start Date End Date Taking? Authorizing Provider  baclofen (LIORESAL) 10 MG tablet Take 1 tablet (10 mg total) by mouth 3 (three) times daily. 04/17/15  Yes Mabe, Onalee Huaavid, NP  furosemide (LASIX) 20 MG tablet Take 20 mg by mouth.   Yes [provider]  HYDROcodone-acetaminophen (NORCO/VICODIN) 5-325 MG tablet Take 1 tablet by mouth 3 (three) times daily as needed. 07/06/18  Yes [provider]  orphenadrine (NORFLEX) 100 MG tablet Take 1 tablet (100 mg total) by mouth 2 (two) times daily. 05/28/17  Yes Kirichenko, Tatyana, PA-C  Pseudoephedrine-Ibuprofen (ADVIL COLD & SINUS LIQUI-GELS) 30-200 MG CAPS Take 1 capsule by mouth 2 (two) times daily as needed (pain).    [provider]    Family History Family History  Problem Relation Age of Onset  . Vision loss Father     Social History Social History   Tobacco Use  . Smoking status:  Current Every Day Smoker    Packs/day: 0.20    Types: Cigarettes  . Smokeless tobacco: Never Used  Substance Use Topics  . Alcohol use: No  . Drug use: Yes    Types: Marijuana     Allergies   Codeine; Codeine; Penicillins; and Penicillins   Review of Systems Review of Systems  Reason unable to perform ROS: See HPI as above.     Physical Exam Triage Vital Signs ED Triage Vitals [07/22/18 1113]  Enc Vitals Group     BP      Pulse      Resp      Temp      Temp src      SpO2      Weight      Height      Head Circumference      Peak Flow      Pain Score 5     Pain Loc      Pain Edu?      Excl. in GC?    No data found.  Updated Vital Signs BP 113/78 (BP Location: Left Arm)   Pulse 66   Temp 98.3 F (36.8 C) (Oral)   Resp 18   LMP 07/16/2018 (Approximate)   SpO2 100%   Physical Exam  Constitutional: She is oriented to person, place, and time. She appears well-developed and well-nourished. No distress.  HENT:  Head: Normocephalic and atraumatic.  Left Ear: Tympanic membrane and external ear normal. Tympanic membrane is not erythematous and not bulging.  Black foreign body consistent with earphone plug in right ear canal.   After foreign body removal, no erythema, swelling of the ear canal. TM normal without erythema or bulging.   Eyes: Pupils are equal, round, and reactive to light. Conjunctivae are normal.  Neurological: She is alert and oriented to person, place, and time.  Skin: She is not diaphoretic.     UC Treatments / Results  Labs (all labs ordered are listed, but only abnormal results are displayed) Labs Reviewed - No data to display  EKG None  Radiology No results found.  Procedures Foreign Body Removal Date/Time: 07/22/2018 11:56 AM Performed by: Belinda Fisher, PA-C Authorized by: Mardella Layman, MD   Consent:    Consent obtained:  Verbal   Consent given by:  Patient   Risks discussed:  Incomplete removal, pain, infection and worsening  of condition   Alternatives discussed:  Referral Location:    Location:  Ear   Ear location:  R ear   Depth: ear canal.   Tendon involvement:  None Pre-procedure details:    Imaging:  None   Neurovascular status: intact   Anesthesia (see MAR for exact dosages):    Anesthesia method:  None Procedure type:    Procedure complexity:  Simple Comments:     Foreign body was visualized using otoscope.  Forceps used to remove  foreign body.  1 intact earphone plug removed from right ear.  Patient tolerated procedure well.  No bleeding.   (including critical care time)  Medications Ordered in UC Medications - No data to display  Initial Impression / Assessment and Plan / UC Course  I have reviewed the triage vital signs and the nursing notes.  Pertinent labs & imaging results that were available during my care of the patient were reviewed by me and considered in my medical decision making (see chart for details).    Patient tolerated procedure well. Foreign body removed. No other foreign bodies seen. Recheck as needed.  Final Clinical Impressions(s) / UC Diagnoses   Final diagnoses:  Ear foreign body, right, initial encounter    ED Prescriptions    None        Belinda Fisher, PA-C 07/22/18 1220

## 2018-07-22 NOTE — ED Triage Notes (Signed)
Pt states she has an earphone cover stuck in her right ear.  It just happened a little while ago on the bus.

## 2019-01-26 ENCOUNTER — Other Ambulatory Visit: Payer: Self-pay | Admitting: Nurse Practitioner

## 2019-01-26 DIAGNOSIS — R42 Dizziness and giddiness: Secondary | ICD-10-CM

## 2019-02-01 ENCOUNTER — Other Ambulatory Visit: Payer: Self-pay | Admitting: Nurse Practitioner

## 2019-02-01 DIAGNOSIS — W19XXXA Unspecified fall, initial encounter: Secondary | ICD-10-CM

## 2019-02-03 ENCOUNTER — Ambulatory Visit
Admission: RE | Admit: 2019-02-03 | Discharge: 2019-02-03 | Disposition: A | Discharge status: Home / Self Care | Payer: Medicare Other | Source: Ambulatory Visit | Attending: Nurse Practitioner | Admitting: Nurse Practitioner

## 2019-02-03 DIAGNOSIS — W19XXXA Unspecified fall, initial encounter: Secondary | ICD-10-CM

## 2019-08-28 ENCOUNTER — Encounter (HOSPITAL_COMMUNITY): Payer: Self-pay | Admitting: Emergency Medicine

## 2019-08-28 ENCOUNTER — Other Ambulatory Visit: Payer: Self-pay

## 2019-08-28 ENCOUNTER — Ambulatory Visit (HOSPITAL_COMMUNITY)
Admission: EM | Admit: 2019-08-28 | Discharge: 2019-08-28 | Disposition: A | Discharge status: Home / Self Care | Payer: Medicare Other | Attending: Family Medicine | Admitting: Family Medicine

## 2019-08-28 DIAGNOSIS — K029 Dental caries, unspecified: Secondary | ICD-10-CM

## 2019-08-28 MED ORDER — CHLORHEXIDINE GLUCONATE 0.12 % MT SOLN: 15.0000 mL | Freq: Two times a day (BID) | OROMUCOSAL | 0 refills | Status: DC

## 2019-08-28 MED ORDER — CLINDAMYCIN HCL 150 MG PO CAPS: 150.0000 mg | ORAL_CAPSULE | Freq: Four times a day (QID) | ORAL | 0 refills | Status: DC

## 2019-08-28 NOTE — ED Triage Notes (Signed)
Broken tooth on bottom, right.  Patient has pain for a week.  Swelling to right jaw occurred over night

## 2019-08-28 NOTE — Discharge Instructions (Addendum)
Make an appointment with a dentist on Monday

## 2019-08-28 NOTE — ED Provider Notes (Signed)
[facility]    CSN: [id number] Arrival date & time: 08/28/19  1654      History   Chief Complaint Chief Complaint  Patient presents with  . Dental Pain    HPI Gina Riley is a 33 y.o. female.   Established patient Broken tooth on bottom, right.  Patient has pain for a week.  Swelling to right jaw occurred over night Tooth #31     Past Medical History:  Diagnosis Date  . Cerebral palsy (HCC)     There are no active problems to display for this patient.   Past Surgical History:  Procedure Laterality Date  . "rhyzotomy for CP" on spine so she could walk.  age 33 years old  . CESAREAN SECTION      OB History    Gravida  2   Para  1   Term  1   Preterm  0   AB  1   Living  1     SAB  1   TAB  0   Ectopic  0   Multiple      Live Births  1            Home Medications    Prior to Admission medications   Medication Sig Start Date End Date Taking? Authorizing Provider  baclofen (LIORESAL) 10 MG tablet Take 1 tablet (10 mg total) by mouth 3 (three) times daily. 04/17/15  Yes Hayden RasmussenMabe, David, NP  DICLOFENAC PO Take by mouth.   Yes [provider]  furosemide (LASIX) 20 MG tablet Take 20 mg by mouth.   Yes [provider]  HYDROcodone-acetaminophen (NORCO/VICODIN) 5-325 MG tablet Take 1 tablet by mouth 3 (three) times daily as needed. 07/06/18  Yes [provider]  Multiple Vitamin (MULTIVITAMIN) tablet Take 1 tablet by mouth daily.   Yes [provider]  chlorhexidine (PERIDEX) 0.12 % solution Use as directed 15 mLs in the mouth or throat 2 (two) times daily. 08/28/19   Elvina SidleLauenstein, Vale Peraza, MD  clindamycin (CLEOCIN) 150 MG capsule Take 1 capsule (150 mg total) by mouth every 6 (six) hours. 08/28/19   Elvina SidleLauenstein, Colisha Redler, MD  orphenadrine (NORFLEX) 100 MG tablet Take 1 tablet (100 mg total) by mouth 2 (two) times daily. 05/28/17   Kirichenko, Tatyana, PA-C  Pseudoephedrine-Ibuprofen (ADVIL COLD & SINUS  LIQUI-GELS) 30-200 MG CAPS Take 1 capsule by mouth 2 (two) times daily as needed (pain).    [provider]    Family History Family History  Problem Relation Age of Onset  . Vision loss Father     Social History Social History   Tobacco Use  . Smoking status: Current Every Day Smoker    Packs/day: 0.20    Types: Cigarettes  . Smokeless tobacco: Never Used  Substance Use Topics  . Alcohol use: No  . Drug use: Yes    Types: Marijuana     Allergies   Codeine, Codeine, Penicillins, and Penicillins   Review of Systems Review of Systems   Physical Exam Triage Vital Signs ED Triage Vitals  Enc Vitals Group     BP 08/28/19 1801 112/76     Pulse --      Resp 08/28/19 1801 16     Temp 08/28/19 1801 98.3 F (36.8 C)     Temp Source 08/28/19 1801 Oral     SpO2 08/28/19 1801 100 %     Weight --      Height --  Head Circumference --      Peak Flow --      Pain Score 08/28/19 1757 10     Pain Loc --      Pain Edu? --      Excl. in [place]? --    No data found.  Updated Vital Signs BP 112/76 (BP Location: Right Arm)   Temp 98.3 F (36.8 C) (Oral)   Resp 16   LMP 08/28/2019   SpO2 100%    Physical Exam Vitals signs and nursing note reviewed.  Constitutional:      Appearance: Normal appearance.  HENT:     Ears:     Comments: Tooth #31 has a cavity in it and there is some surrounding gingival swelling Eyes:     Conjunctiva/sclera: Conjunctivae normal.  Neck:     Musculoskeletal: Normal range of motion and neck supple.  Pulmonary:     Effort: Pulmonary effort is normal.  Musculoskeletal: Normal range of motion.  Lymphadenopathy:     Cervical: Cervical adenopathy present.  Skin:    General: Skin is warm.  Neurological:     General: No focal deficit present.     Mental Status: She is alert.  Psychiatric:        Mood and Affect: Mood normal.      UC Treatments / Results  Labs (all labs ordered are listed, but only abnormal results are  displayed) Labs Reviewed - No data to display  EKG   Radiology No results found.  Procedures Procedures (including critical care time)  Medications Ordered in UC Medications - No data to display  Initial Impression / Assessment and Plan / UC Course  I have reviewed the triage vital signs and the nursing notes.  Pertinent labs & imaging results that were available during my care of the patient were reviewed by me and considered in my medical decision making (see chart for details).     Final Clinical Impressions(s) / UC Diagnoses   Final diagnoses:  Dental caries     Discharge Instructions     Make an appointment with a dentist on Monday    ED Prescriptions    Medication Sig Dispense Auth. Provider   clindamycin (CLEOCIN) 150 MG capsule Take 1 capsule (150 mg total) by mouth every 6 (six) hours. 28 capsule Robyn Haber, MD   chlorhexidine (PERIDEX) 0.12 % solution Use as directed 15 mLs in the mouth or throat 2 (two) times daily. 120 mL Robyn Haber, MD     Controlled Substance Prescriptions [place] Controlled Substance Registry consulted? Not Applicable   Robyn Haber, MD 08/28/19 [phone]

## 2019-09-09 ENCOUNTER — Encounter (HOSPITAL_COMMUNITY): Payer: Self-pay

## 2019-09-09 ENCOUNTER — Emergency Department (HOSPITAL_COMMUNITY)
Admission: EM | Admit: 2019-09-09 | Discharge: 2019-09-09 | Disposition: A | Discharge status: Home / Self Care | Payer: Medicare Other | Attending: Emergency Medicine | Admitting: Emergency Medicine

## 2019-09-09 ENCOUNTER — Other Ambulatory Visit: Payer: Self-pay

## 2019-09-09 DIAGNOSIS — N3091 Cystitis, unspecified with hematuria: Secondary | ICD-10-CM | POA: Insufficient documentation

## 2019-09-09 DIAGNOSIS — N390 Urinary tract infection, site not specified: Secondary | ICD-10-CM

## 2019-09-09 DIAGNOSIS — K0889 Other specified disorders of teeth and supporting structures: Secondary | ICD-10-CM | POA: Diagnosis not present

## 2019-09-09 DIAGNOSIS — R1031 Right lower quadrant pain: Secondary | ICD-10-CM | POA: Diagnosis present

## 2019-09-09 DIAGNOSIS — F17210 Nicotine dependence, cigarettes, uncomplicated: Secondary | ICD-10-CM | POA: Diagnosis not present

## 2019-09-09 LAB — URINALYSIS, ROUTINE W REFLEX MICROSCOPIC
Bilirubin Urine: NEGATIVE
Glucose, UA: NEGATIVE mg/dL
Hgb urine dipstick: NEGATIVE
Ketones, ur: 20 mg/dL — AB
Nitrite: POSITIVE — AB
Protein, ur: NEGATIVE mg/dL
Specific Gravity, Urine: 1.036 — ABNORMAL HIGH (ref 1.005–1.030)
pH: 6 (ref 5.0–8.0)

## 2019-09-09 LAB — PREGNANCY, URINE: Preg Test, Ur: NEGATIVE

## 2019-09-09 MED ORDER — NITROFURANTOIN MONOHYD MACRO 100 MG PO CAPS
100.0000 mg | ORAL_CAPSULE | Freq: Once | ORAL | Status: AC
  Administered 2019-09-09: 100 mg via ORAL
  Filled 2019-09-09: qty 1

## 2019-09-09 MED ORDER — MELOXICAM 7.5 MG PO TABS: 7.5000 mg | ORAL_TABLET | Freq: Every day | ORAL | 0 refills | Status: DC | PRN

## 2019-09-09 MED ORDER — HYDROCODONE-ACETAMINOPHEN 5-325 MG PO TABS
1.0000 | ORAL_TABLET | Freq: Once | ORAL | Status: AC
  Administered 2019-09-09: 1 via ORAL
  Filled 2019-09-09: qty 1

## 2019-09-09 MED ORDER — NITROFURANTOIN MONOHYD MACRO 100 MG PO CAPS: 100.0000 mg | ORAL_CAPSULE | Freq: Two times a day (BID) | ORAL | 0 refills | Status: DC

## 2019-09-09 MED ORDER — IBUPROFEN 200 MG PO TABS
600.0000 mg | ORAL_TABLET | Freq: Once | ORAL | Status: AC
  Administered 2019-09-09: 600 mg via ORAL
  Filled 2019-09-09: qty 3

## 2019-09-09 NOTE — ED Triage Notes (Signed)
Pt reports R flank pain starting at 2 am and continued l sided dental pain. No vomiting or fever. A&Ox4. Ambulatory.

## 2019-09-11 LAB — URINE CULTURE: Culture: >=100000 — AB

## 2019-09-12 NOTE — ED Provider Notes (Signed)
[facility] DEPT Provider Note   CSN: [id number] Arrival date & time: 09/09/19  8295     History   Chief Complaint Chief Complaint  Patient presents with  . Dental Pain    L  . Flank Pain    R    HPI Gina Riley is a 33 y.o. female.     HPI   5yF with RLW/supraubic pain.  Onset this morning.  Persistent since then.  Associated with urinary frequency/urgency.  No dysuria or hematuria.  No unusual vaginal bleeding or discharge.  No fevers or chills.  No change in her bowel movements.  Additionally, she is complaining of right lower tooth pain.  Ongoing for weeks.  Recently on antibiotics for the same.  She has yet to follow-up with a dentist.  No difficulty breathing or swallowing.  Past Medical History:  Diagnosis Date  . Cerebral palsy ([place])     There are no active problems to display for this patient.   Past Surgical History:  Procedure Laterality Date  . "rhyzotomy for CP" on spine so she could walk.  age 1 years old  . CESAREAN SECTION       OB History    Gravida  2   Para  1   Term  1   Preterm  0   AB  1   Living  1     SAB  1   TAB  0   Ectopic  0   Multiple      Live Births  1            Home Medications    Prior to Admission medications   Medication Sig Start Date End Date Taking? Authorizing Provider  acetaminophen (TYLENOL) 325 MG tablet Take 650 mg by mouth every 6 (six) hours as needed for mild pain or headache.   Yes [provider]  baclofen (LIORESAL) 10 MG tablet Take 1 tablet (10 mg total) by mouth 3 (three) times daily. 04/17/15  Yes Mabe, Shanon Brow, NP  chlorhexidine (PERIDEX) 0.12 % solution Use as directed 15 mLs in the mouth or throat 2 (two) times daily. 08/28/19  Yes Robyn Haber, MD  clindamycin (CLEOCIN) 150 MG capsule Take 1 capsule (150 mg total) by mouth every 6 (six) hours. 08/28/19  Yes Robyn Haber, MD  diclofenac (VOLTAREN) 50 MG EC tablet Take 50 mg by mouth  every 8 (eight) hours as needed for muscle pain. 08/05/19  Yes [provider]  furosemide (LASIX) 20 MG tablet Take 20 mg by mouth daily.    Yes [provider]  HYDROcodone-acetaminophen (NORCO/VICODIN) 5-325 MG tablet Take 1 tablet by mouth 3 (three) times daily as needed for moderate pain.  07/06/18  Yes [provider]  sodium chloride (OCEAN) 0.65 % SOLN nasal spray Place 1 spray into both nostrils as needed for congestion.   Yes [provider]  Vitamin D, Ergocalciferol, (DRISDOL) 1.25 MG (50000 UT) CAPS capsule Take 50,000 Units by mouth once a week. 08/01/19  Yes [provider]  meloxicam (MOBIC) 7.5 MG tablet Take 1 tablet (7.5 mg total) by mouth daily as needed for pain. 09/09/19   Virgel Manifold, MD  nitrofurantoin, macrocrystal-monohydrate, (MACROBID) 100 MG capsule Take 1 capsule (100 mg total) by mouth 2 (two) times daily. 09/09/19   Virgel Manifold, MD  orphenadrine (NORFLEX) 100 MG tablet Take 1 tablet (100 mg total) by mouth 2 (two) times daily. Patient not taking: Reported on 09/09/2019 05/28/17  Jaynie CrumbleKirichenko, Tatyana, PA-C    Family History Family History  Problem Relation Age of Onset  . Vision loss Father     Social History Social History   Tobacco Use  . Smoking status: Current Every Day Smoker    Packs/day: 0.20    Types: Cigarettes  . Smokeless tobacco: Never Used  Substance Use Topics  . Alcohol use: No  . Drug use: Yes    Types: Marijuana     Allergies   Codeine, Codeine, Penicillins, and Penicillins   Review of Systems Review of Systems  All systems reviewed and negative, other than as noted in HPI.  Physical Exam Updated Vital Signs BP 125/81   Pulse 72   Temp 98.5 F (36.9 C) (Oral)   Resp 18   LMP 08/28/2019   SpO2 98%   Physical Exam Vitals signs and nursing note reviewed.  Constitutional:      General: She is not in acute distress.    Appearance: She is well-developed.  HENT:     Head:  Normocephalic and atraumatic.     Comments: R lower molar cracked. No drainable collection.  Eyes:     General:        Right eye: No discharge.        Left eye: No discharge.     Conjunctiva/sclera: Conjunctivae normal.  Neck:     Musculoskeletal: Neck supple.  Cardiovascular:     Rate and Rhythm: Normal rate and regular rhythm.     Heart sounds: Normal heart sounds. No murmur. No friction rub. No gallop.   Pulmonary:     Effort: Pulmonary effort is normal. No respiratory distress.     Breath sounds: Normal breath sounds.  Abdominal:     General: There is no distension.     Palpations: Abdomen is soft.     Tenderness: There is abdominal tenderness.     Comments: Mild suprapubic ttp w/o rebound or guarding  Musculoskeletal:        General: No tenderness.  Skin:    General: Skin is warm and dry.  Neurological:     Mental Status: She is alert.  Psychiatric:        Behavior: Behavior normal.        Thought Content: Thought content normal.      ED Treatments / Results  Labs (all labs ordered are listed, but only abnormal results are displayed) Labs Reviewed  URINE CULTURE - Abnormal; Notable for the following components:      Result Value   Culture >=100,000 COLONIES/mL ESCHERICHIA COLI (*)    Organism ID, Bacteria ESCHERICHIA COLI (*)    All other components within normal limits  URINALYSIS, ROUTINE W REFLEX MICROSCOPIC - Abnormal; Notable for the following components:   Specific Gravity, Urine 1.036 (*)    Ketones, ur 20 (*)    Nitrite POSITIVE (*)    Leukocytes,Ua SMALL (*)    Bacteria, UA MANY (*)    All other components within normal limits  PREGNANCY, URINE    EKG None  Radiology No results found.  Procedures Procedures (including critical care time)  Medications Ordered in ED Medications  ibuprofen (ADVIL) tablet 600 mg (600 mg Oral Given 09/09/19 1216)  HYDROcodone-acetaminophen (NORCO/VICODIN) 5-325 MG per tablet 1 tablet (1 tablet Oral Given 09/09/19  1216)  nitrofurantoin (macrocrystal-monohydrate) (MACROBID) capsule 100 mg (100 mg Oral Given 09/09/19 1242)     Initial Impression / Assessment and Plan / ED Course  I have reviewed the triage vital  signs and the nursing notes.  Pertinent labs & imaging results that were available during my care of the patient were reviewed by me and considered in my medical decision making (see chart for details).        33 year old female with symptoms and exam consistent with UTI.  Only mild tenderness on examination.  Certainly no peritonitis.  I feel she is appropriate for outpatient treatment.  We will send a urine culture.  Antibiotics.  She had a cracked right lower molar but no overt signs of infection.  She is provided with a list of dental resources.  Return precautions discussed.  Final Clinical Impressions(s) / ED Diagnoses   Final diagnoses:  Urinary tract infection with hematuria, site unspecified  Pain, dental    ED Discharge Orders         Ordered    nitrofurantoin, macrocrystal-monohydrate, (MACROBID) 100 MG capsule  2 times daily     09/09/19 1224    meloxicam (MOBIC) 7.5 MG tablet  Daily PRN     09/09/19 1224           Raeford Razor, MD 09/12/19 [id number]

## 2019-09-13 ENCOUNTER — Telehealth: Payer: Self-pay | Admitting: *Deleted

## 2019-09-13 NOTE — Telephone Encounter (Signed)
Post ED Visit - Positive Culture Follow-up  Culture report reviewed by antimicrobial stewardship pharmacist: [facility] Team []  Elenor Quinones, Pharm.D. []  Heide Guile, Pharm.D., BCPS AQ-ID []  Parks Neptune, Pharm.D., BCPS []  Alycia Rossetti, Pharm.D., BCPS []  Peralta, Pharm.D., BCPS, AAHIVP []  Legrand Como, Pharm.D., BCPS, AAHIVP []  Salome Arnt, PharmD, BCPS []  Johnnette Gourd, PharmD, BCPS []  Hughes Better, PharmD, BCPS []  Leeroy Cha, PharmD []  Laqueta Linden, PharmD, BCPS []  Albertina Parr, PharmD  [facility] Team []  Leodis Sias, PharmD []  Lindell Spar, PharmD []  Royetta Asal, PharmD []  Graylin Shiver, Rph []  Rema Fendt) Glennon Mac, PharmD []  Arlyn Dunning, PharmD []  Netta Cedars, PharmD []  Dia Sitter, PharmD []  Leone Haven, PharmD []  Gretta Arab, PharmD []  Theodis Shove, PharmD []  Peggyann Juba, PharmD []  Reuel Boom, PharmD   Positive urine culture Treated with Nitrofurantoin Nonohyd MAcro, organism sensitive to the same and no further patient follow-up is required at this time.  Harlon Flor [facility] 09/13/2019, 4:31 PM

## 2019-09-21 ENCOUNTER — Other Ambulatory Visit: Payer: Self-pay

## 2019-09-21 DIAGNOSIS — Z20822 Contact with and (suspected) exposure to covid-19: Secondary | ICD-10-CM

## 2019-09-22 LAB — NOVEL CORONAVIRUS, NAA: SARS-CoV-2, NAA: NOT DETECTED

## 2019-09-24 ENCOUNTER — Telehealth: Payer: Self-pay | Admitting: General Practice

## 2019-09-24 NOTE — Telephone Encounter (Signed)
Negative COVID results given. Patient results "NOT Detected." Caller expressed understanding. ° °

## 2019-11-24 ENCOUNTER — Ambulatory Visit: Payer: Medicare Other | Admitting: Physical Therapy

## 2019-11-25 ENCOUNTER — Ambulatory Visit: Payer: Medicare Other | Admitting: Physical Therapy

## 2019-12-02 ENCOUNTER — Other Ambulatory Visit: Payer: Self-pay

## 2019-12-02 ENCOUNTER — Encounter: Payer: Self-pay | Admitting: Physical Therapy

## 2019-12-02 ENCOUNTER — Ambulatory Visit: Payer: Medicare Other | Attending: Internal Medicine | Admitting: Physical Therapy

## 2019-12-02 DIAGNOSIS — M545 Low back pain, unspecified: Secondary | ICD-10-CM

## 2019-12-02 DIAGNOSIS — R252 Cramp and spasm: Secondary | ICD-10-CM | POA: Diagnosis present

## 2019-12-02 DIAGNOSIS — R2681 Unsteadiness on feet: Secondary | ICD-10-CM | POA: Diagnosis present

## 2019-12-02 DIAGNOSIS — M6281 Muscle weakness (generalized): Secondary | ICD-10-CM | POA: Insufficient documentation

## 2019-12-02 DIAGNOSIS — G8929 Other chronic pain: Secondary | ICD-10-CM

## 2019-12-02 DIAGNOSIS — R2689 Other abnormalities of gait and mobility: Secondary | ICD-10-CM | POA: Diagnosis present

## 2019-12-02 DIAGNOSIS — R296 Repeated falls: Secondary | ICD-10-CM | POA: Diagnosis present
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
## 2019-12-15 ENCOUNTER — Encounter

## 2019-12-21 ENCOUNTER — Other Ambulatory Visit: Payer: Self-pay

## 2019-12-21 ENCOUNTER — Ambulatory Visit: Payer: Medicare Other | Attending: Internal Medicine | Admitting: Physical Therapy

## 2019-12-21 ENCOUNTER — Encounter: Payer: Self-pay | Admitting: Physical Therapy

## 2019-12-21 DIAGNOSIS — R296 Repeated falls: Secondary | ICD-10-CM | POA: Insufficient documentation

## 2019-12-21 DIAGNOSIS — M545 Low back pain: Secondary | ICD-10-CM | POA: Diagnosis not present

## 2019-12-21 DIAGNOSIS — M6281 Muscle weakness (generalized): Secondary | ICD-10-CM | POA: Insufficient documentation

## 2019-12-21 DIAGNOSIS — R2681 Unsteadiness on feet: Secondary | ICD-10-CM | POA: Diagnosis present

## 2019-12-21 DIAGNOSIS — R262 Difficulty in walking, not elsewhere classified: Secondary | ICD-10-CM | POA: Diagnosis present

## 2019-12-21 DIAGNOSIS — R2689 Other abnormalities of gait and mobility: Secondary | ICD-10-CM | POA: Insufficient documentation

## 2019-12-21 DIAGNOSIS — R252 Cramp and spasm: Secondary | ICD-10-CM | POA: Insufficient documentation

## 2019-12-21 DIAGNOSIS — G8929 Other chronic pain: Secondary | ICD-10-CM | POA: Insufficient documentation

## 2019-12-21 DIAGNOSIS — M5440 Lumbago with sciatica, unspecified side: Secondary | ICD-10-CM | POA: Insufficient documentation

## 2019-12-21 NOTE — Therapy (Signed)
[facility] [address] [place], [place], [address] Phone: [phone]   Fax:  [phone]  Physical Therapy Treatment  Patient Details  Name: Gina Riley MRN: [id number] Date of Birth: June 21, 1986 Referring Provider (PT): Salli Real, MD    Encounter Date: 12/21/2019  PT End of Session - 12/21/19 1453    Visit Number  2    Number of Visits  13    Date for PT Re-Evaluation  01/13/20    Authorization Type  MCR: KX at 15th visit, progress note at 10th visit    PT Start Time  1455    PT Stop Time  1540    PT Time Calculation (min)  45 min    Activity Tolerance  Patient tolerated treatment well    Behavior During Therapy  [facility] for tasks assessed/performed       Past Medical History:  Diagnosis Date  . Cerebral palsy [facility])     Past Surgical History:  Procedure Laterality Date  . "rhyzotomy for CP" on spine so she could walk.  age 34 years old  . CESAREAN SECTION      There were no vitals filed for this visit.  Subjective Assessment - 12/21/19 1459    Subjective  "I am doing okay, I am noticing that I am having some loss of feeling int he front of the hip"    Patient Stated Goals  to decrease low back pain, increase strength in the legs, have a good exercise regiment    Currently in Pain?  Yes    Pain Location  Back    Pain Orientation  Left    Pain Descriptors / Indicators  Aching    Pain Type  Chronic pain    Pain Onset  More than a month ago    Pain Frequency  Intermittent    Aggravating Factors   prolonged standing/ walking, sleeping    Pain Relieving Factors  medication, propping pillows         [facility] PT Assessment - 12/21/19 0001      Assessment   Medical Diagnosis  Low back pain     Referring Provider (PT)  Salli Real, MD       Standardized Balance Assessment   Standardized Balance Assessment  Berg Balance Test      Berg Balance Test   Sit to Stand  Able to stand without using hands and stabilize  independently    Standing Unsupported  Able to stand safely 2 minutes    Sitting with Back Unsupported but Feet Supported on Floor or Stool  Able to sit safely and securely 2 minutes    Stand to Sit  Sits safely with minimal use of hands    Transfers  Able to transfer safely, minor use of hands    Standing Unsupported with Eyes Closed  Able to stand 10 seconds safely    Standing Unsupported with Feet Together  Able to place feet together independently and stand 1 minute safely    From Standing, Reach Forward with Outstretched Arm  Can reach forward >12 cm safely (5")    From Standing Position, Pick up Object from Floor  Able to pick up shoe safely and easily    From Standing Position, Turn to Look Behind Over each Shoulder  Looks behind from both sides and weight shifts well    Turn 360 Degrees  Able to turn 360 degrees safely in 4 seconds or less    Standing Unsupported,  Alternately Place Feet on Step/Stool  Able to complete >2 steps/needs minimal assist    Standing Unsupported, One Foot in Front  Able to take small step independently and hold 30 seconds    Standing on One Leg  Able to lift leg independently and hold equal to or more than 3 seconds    Total Score  48                   [facility] Adult PT Treatment/Exercise - 12/21/19 0001      Self-Care   Self-Care  Other Self-Care Comments    Other Self-Care Comments   benefits of wearing shoes that fit to avoid potential tripping       Lumbar Exercises: Stretches   Active Hamstring Stretch  2 reps;30 seconds      Lumbar Exercises: Seated   Other Seated Lumbar Exercises  seated on dyna-disc: anterior pelvic tilt with abdominal draw in 1 x 10 holding 5 seconds, marching 2 x 10 while mantaining efficent posture      Knee/Hip Exercises: Stretches   Active Hamstring Stretch  2 reps;Both;30 seconds   seated     Knee/Hip Exercises: Standing   Gait Training  4 x 30 ft focusing on heel strike only to reduce drag of the L foot.  she  was able to correct with cues    Other Standing Knee Exercises  standing marching 2 x 10 with 1 HHA on the counter, cues to focus on control and avoid letting foot slap on the floor             PT Education - 12/21/19 1542    Education Details  reviewed previously provided HEP    Person(s) Educated  Patient    Methods  Explanation;Verbal cues    Comprehension  Verbalized understanding;Verbal cues required       PT Short Term Goals - 12/21/19 1547      PT SHORT TERM GOAL #1   Title  pt to be I with inital HEP    Time  3    Period  Weeks    Status  On-going      PT SHORT TERM GOAL #2   Title  Pt will be tested on BERG and  LTG set    Period  Weeks    Status  Achieved        PT Long Term Goals - 12/21/19 1548      PT LONG TERM GOAL #1   Title  "Demonstrate and verbalize techniques to reduce the risk of re-injury including: lifting, posture, body mechanics.     Period  Weeks    Status  On-going      PT LONG TERM GOAL #2   Title  increase gross hip strength to >/= 4+/5 to promote efficent posture/ biomechanics to maximize safety and reduce injury    Period  Weeks    Status  On-going      PT LONG TERM GOAL #3   Title  pt to be able to walk/ stand for >/= 60 min with no report of pain or limitation for functional endurnace required for work/ school related tasks    Period  Weeks    Status  On-going      PT LONG TERM GOAL #4   Title  increase FOTO score to </= 38% limited to demo improvement in function    Period  Weeks    Status  On-going      PT LONG  TERM GOAL #5   Title  pt to be I with all HEP given as of last visit to maintain and progress current level of function    Period  Weeks    Status  On-going      Additional Long Term Goals   Additional Long Term Goals  Yes      PT LONG TERM GOAL #6   Title  increase BERG balance to >/= 52/56 to demo improvement in balance    Time  6    Period  Weeks    Status  New            Plan - 12/21/19 1542     Clinical Impression Statement  Pt reports consistency with her HEP and notes it is helping. she scored a 48/56 which does place her at a moderate fall risk. reviewd posture and in sitting to reduce tension in the low back, and continued hamstring stretching. practiced gait training with focus on heel strike only to reduce dragging of the L foot which she did well with but required verbal cues for form. She noted feeling better and reported feeling looser at the end of the session.    PT Treatment/Interventions  ADLs/Self Care Home Management;Cryotherapy;Electrical Stimulation;Iontophoresis 4mg /ml Dexamethasone;Moist Heat;Ultrasound;Gait training;Therapeutic activities;Therapeutic exercise;Balance training;Neuromuscular re-education;Manual techniques;Dry needling;Taping;Patient/family education    PT Next Visit Plan  review HEP/ update PRN, BERG balance, hamstring stretching, gross LE strengthening, core strengthening, work on efficent posture for prolonged standing with [employer]    PT Home Exercise Plan  [id number] - sit to stand, seated and supine hamstring stretch, clamshells, SLR, low back stretch    Consulted and Agree with Plan of Care  Patient       Patient will benefit from skilled therapeutic intervention in order to improve the following deficits and impairments:  Abnormal gait, Improper body mechanics, Increased muscle spasms, Decreased strength, Postural dysfunction, Pain, Decreased activity tolerance, Decreased balance, Decreased mobility, Decreased endurance  Visit Diagnosis: Chronic bilateral low back pain without sciatica  Muscle weakness (generalized)  Other abnormalities of gait and mobility     Problem List There are no problems to display for this patient.  Starr Lake PT, DPT, LAT, ATC  12/21/19  3:54 PM      [facility] [address] [place], [place], [address] Phone: [phone]   Fax:   [phone]  Name: Gina Riley MRN: [id number] Date of Birth: 31-Jan-1986

## 2019-12-23 ENCOUNTER — Other Ambulatory Visit: Payer: Self-pay

## 2019-12-23 ENCOUNTER — Encounter: Payer: Self-pay | Admitting: Physical Therapy

## 2019-12-23 ENCOUNTER — Ambulatory Visit: Payer: Medicare Other | Admitting: Physical Therapy

## 2019-12-23 DIAGNOSIS — R252 Cramp and spasm: Secondary | ICD-10-CM

## 2019-12-23 DIAGNOSIS — M6281 Muscle weakness (generalized): Secondary | ICD-10-CM

## 2019-12-23 DIAGNOSIS — M545 Low back pain, unspecified: Secondary | ICD-10-CM

## 2019-12-23 DIAGNOSIS — R2681 Unsteadiness on feet: Secondary | ICD-10-CM

## 2019-12-23 DIAGNOSIS — G8929 Other chronic pain: Secondary | ICD-10-CM

## 2019-12-23 DIAGNOSIS — R262 Difficulty in walking, not elsewhere classified: Secondary | ICD-10-CM

## 2019-12-23 DIAGNOSIS — R2689 Other abnormalities of gait and mobility: Secondary | ICD-10-CM

## 2019-12-23 DIAGNOSIS — R296 Repeated falls: Secondary | ICD-10-CM

## 2019-12-23 DIAGNOSIS — M5440 Lumbago with sciatica, unspecified side: Secondary | ICD-10-CM

## 2019-12-23 NOTE — Therapy (Signed)
[facility] [address] [place], [place], [address] Phone: [phone]   Fax:  [phone]  Physical Therapy Treatment  Patient Details  Name: Gina Riley MRN: [id number] Date of Birth: August 29, 1986 Referring Provider (PT): Salli Real, MD    Encounter Date: 12/23/2019  PT End of Session - 12/23/19 1541    Visit Number  3    Number of Visits  13    Date for PT Re-Evaluation  01/13/20    Authorization Type  MCR: KX at 15th visit, progress note at 10th visit    PT Start Time  1539    PT Stop Time  1617    PT Time Calculation (min)  38 min    Activity Tolerance  Patient tolerated treatment well    Behavior During Therapy  [facility] for tasks assessed/performed       Past Medical History:  Diagnosis Date  . Cerebral palsy [facility])     Past Surgical History:  Procedure Laterality Date  . "rhyzotomy for CP" on spine so she could walk.  age 34 years old  . CESAREAN SECTION      There were no vitals filed for this visit.  Subjective Assessment - 12/23/19 1547    Subjective  I was doing well today until I slipped on wet floor at school.    Currently in Pain?  Yes    Pain Score  3     Pain Location  Back    Pain Orientation  Left    Pain Descriptors / Indicators  Aching    Pain Type  Chronic pain    Pain Onset  More than a month ago    Pain Frequency  Intermittent    Aggravating Factors   prolonged standing, walking    Pain Relieving Factors  meds, propping         [facility] Adult PT Treatment/Exercise - 12/23/19 0001      Lumbar Exercises: Stretches   Active Hamstring Stretch  3 reps;30 seconds    Single Knee to Chest Stretch  3 reps;30 seconds    Lower Trunk Rotation  5 reps;20 seconds      Lumbar Exercises: Aerobic   Nustep  7 min L4 UE and LE       Lumbar Exercises: Standing   Other Standing Lumbar Exercises  core press x 10 both hands       Lumbar Exercises: Supine   Large Ball Oblique Isometric Limitations  x 5  each side       Knee/Hip Exercises: Supine   Bridges  Strengthening;1 set;10 reps    Bridges Limitations  red band       added clam x 10           PT Short Term Goals - 12/21/19 1547      PT SHORT TERM GOAL #1   Title  pt to be I with inital HEP    Time  3    Period  Weeks    Status  On-going      PT SHORT TERM GOAL #2   Title  Pt will be tested on BERG and  LTG set    Period  Weeks    Status  Achieved        PT Long Term Goals - 12/21/19 1548      PT LONG TERM GOAL #1   Title  "Demonstrate and verbalize techniques to reduce the risk of re-injury including: lifting, posture, body mechanics.  Period  Weeks    Status  On-going      PT LONG TERM GOAL #2   Title  increase gross hip strength to >/= 4+/5 to promote efficent posture/ biomechanics to maximize safety and reduce injury    Period  Weeks    Status  On-going      PT LONG TERM GOAL #3   Title  pt to be able to walk/ stand for >/= 60 min with no report of pain or limitation for functional endurnace required for work/ school related tasks    Period  Weeks    Status  On-going      PT LONG TERM GOAL #4   Title  increase FOTO score to </= 38% limited to demo improvement in function    Period  Weeks    Status  On-going      PT LONG TERM GOAL #5   Title  pt to be I with all HEP given as of last visit to maintain and progress current level of function    Period  Weeks    Status  On-going      Additional Long Term Goals   Additional Long Term Goals  Yes      PT LONG TERM GOAL #6   Title  increase BERG balance to >/= 52/56 to demo improvement in balance    Time  6    Period  Weeks    Status  New            Plan - 12/23/19 1542    Clinical Impression Statement  Patient unfortunately had a slip and fall today but has not reported any major increase in pain.  She asked questions about wearing a back brace and I recommend she only wear it at school when standing long periods. Introduced core work  today and she felt much less "pinching" in R side of low back.    PT Frequency  2x / week    PT Treatment/Interventions  ADLs/Self Care Home Management;Cryotherapy;Electrical Stimulation;Iontophoresis 4mg /ml Dexamethasone;Moist Heat;Ultrasound;Gait training;Therapeutic activities;Therapeutic exercise;Balance training;Neuromuscular re-education;Manual techniques;Dry needling;Taping;Patient/family education    PT Next Visit Plan  review HEP/ update PRN, hamstring stretching, gross LE strengthening, core strengthening, work on efficent posture for prolonged standing with [employer]    PT Home Exercise Plan  [id number] - sit to stand, seated and supine hamstring stretch, clamshells, SLR, low back stretch. Bridge and bridge and clam red band .    Consulted and Agree with Plan of Care  Patient       Patient will benefit from skilled therapeutic intervention in order to improve the following deficits and impairments:  Abnormal gait, Improper body mechanics, Increased muscle spasms, Decreased strength, Postural dysfunction, Pain, Decreased activity tolerance, Decreased balance, Decreased mobility, Decreased endurance  Visit Diagnosis: Chronic bilateral low back pain without sciatica  Muscle weakness (generalized)  Other abnormalities of gait and mobility  Repeated falls  Unsteadiness on feet  Cramp and spasm  Chronic bilateral low back pain with sciatica, sciatica laterality unspecified  Difficulty in walking, not elsewhere classified     Problem List There are no problems to display for this patient.   [name] 12/23/2019, 5:56 PM  [facility] [place], [place], [address] Phone: [phone]   Fax:  [phone]  Name: Gina Riley MRN: [id number] Date of Birth: 24-Mar-1986  Raeford Razor, PT 12/23/19 5:57 PM Phone: [phone] Fax: [phone]

## 2019-12-28 ENCOUNTER — Ambulatory Visit: Payer: Medicare Other | Admitting: Physical Therapy

## 2019-12-30 ENCOUNTER — Telehealth: Payer: Self-pay | Admitting: Physical Therapy

## 2019-12-30 ENCOUNTER — Ambulatory Visit: Payer: Medicare Other | Admitting: Physical Therapy

## 2019-12-31 NOTE — Telephone Encounter (Signed)
Spoke with pt regarding her missing her last two scheduled appointments. She stated she couldn't leave due to her school stating they would drop her if she left for her appointment. I told her we can work on rescheduling her for a later appointment time, but due to the 2 missed appointments per the clinic policy we can only schedule one visit at a time moving forward. She stated she understood and wanted to go ahead and reschedule her visits.    Tacha Manni PT, DPT, LAT, ATC  12/31/19  12:31 PM

## 2020-01-04 ENCOUNTER — Ambulatory Visit: Payer: Medicare Other | Admitting: Physical Therapy

## 2020-01-06 ENCOUNTER — Encounter: Payer: Medicare Other | Admitting: Physical Therapy

## 2020-01-11 ENCOUNTER — Encounter: Payer: Medicare Other | Admitting: Physical Therapy

## 2020-01-13 ENCOUNTER — Encounter: Payer: Medicare Other | Admitting: Physical Therapy

## 2020-01-18 ENCOUNTER — Other Ambulatory Visit: Payer: Self-pay

## 2020-01-18 ENCOUNTER — Ambulatory Visit: Payer: Medicare Other | Attending: Internal Medicine | Admitting: Physical Therapy

## 2020-01-18 ENCOUNTER — Encounter: Payer: Self-pay | Admitting: Physical Therapy

## 2020-01-18 DIAGNOSIS — R296 Repeated falls: Secondary | ICD-10-CM

## 2020-01-18 DIAGNOSIS — M5440 Lumbago with sciatica, unspecified side: Secondary | ICD-10-CM | POA: Diagnosis present

## 2020-01-18 DIAGNOSIS — R2681 Unsteadiness on feet: Secondary | ICD-10-CM | POA: Insufficient documentation

## 2020-01-18 DIAGNOSIS — M6281 Muscle weakness (generalized): Secondary | ICD-10-CM | POA: Insufficient documentation

## 2020-01-18 DIAGNOSIS — R262 Difficulty in walking, not elsewhere classified: Secondary | ICD-10-CM | POA: Diagnosis present

## 2020-01-18 DIAGNOSIS — R252 Cramp and spasm: Secondary | ICD-10-CM | POA: Diagnosis present

## 2020-01-18 DIAGNOSIS — M545 Low back pain: Secondary | ICD-10-CM | POA: Diagnosis not present

## 2020-01-18 DIAGNOSIS — G8929 Other chronic pain: Secondary | ICD-10-CM | POA: Insufficient documentation

## 2020-01-18 DIAGNOSIS — R2689 Other abnormalities of gait and mobility: Secondary | ICD-10-CM | POA: Diagnosis present

## 2020-01-19 NOTE — Therapy (Addendum)
[place], [place], [address] Phone: [phone]   Fax:  [phone]  Physical Therapy Treatment/Recertification/Discharge  Patient Details  Name: Gina Riley MRN: [id number] Date of Birth: 1986-02-25 Referring Provider (PT): Sandi Mariscal, MD    Encounter Date: 01/18/2020  PT End of Session - 01/18/20 1715    Visit Number  4    Number of Visits  24    Date for PT Re-Evaluation  04/14/20    Authorization Type  MCR: KX at 15th visit, progress note at 10th visit    PT Start Time  1715    PT Stop Time  1755    PT Time Calculation (min)  40 min    Activity Tolerance  Patient tolerated treatment well    Behavior During Therapy  [facility] for tasks assessed/performed       Past Medical History:  Diagnosis Date  . Cerebral palsy [facility])     Past Surgical History:  Procedure Laterality Date  . "rhyzotomy for CP" on spine so she could walk.  age 34 years old  . CESAREAN SECTION      There were no vitals filed for this visit.  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   Vocation Requirements  prolonged standing/ walking      Strength   Right Hip External Rotation   3-/5    Left Hip External Rotation  3-/5      Ambulation/Gait   Gait Comments  limited knee extension throughout gait, with R bil gen valgum L>R                   [facility] Adult PT Treatment/Exercise - 01/18/20 0001      Lumbar Exercises: Seated   Other  Seated Lumbar Exercises  horiz abd with pelvic tilt      Lumbar Exercises: Supine   Clam Limitations  hooklying clam yellow tband    Bent Knee Raise Limitations  ab set with march      Knee/Hip Exercises: Sidelying   Hip ABduction Limitations  x20    Clams  x30             PT Education - 01/18/20 1731    Education Details  arch supports and shoe wear    Person(s) Educated  Patient    Methods  Explanation    Comprehension  Verbalized understanding;Need further instruction       PT Short Term Goals - 12/21/19 1547      PT SHORT TERM GOAL #1   Title  pt to be I with inital HEP    Time  3    Period  Weeks    Status  On-going      PT SHORT TERM GOAL #2   Title  Pt will be tested on BERG  and  LTG set    Period  Weeks    Status  Achieved        PT Long Term Goals - 12/21/19 1548      PT LONG TERM GOAL #1   Title  "Demonstrate and verbalize techniques to reduce the risk of re-injury including: lifting, posture, body mechanics.     Period  Weeks    Status  On-going      PT LONG TERM GOAL #2   Title  increase gross hip strength to >/= 4+/5 to promote efficent posture/ biomechanics to maximize safety and reduce injury    Period  Weeks    Status  On-going      PT LONG TERM GOAL #3   Title  pt to be able to walk/ stand for >/= 60 min with no report of pain or limitation for functional endurnace required for work/ school related tasks    Period  Weeks    Status  On-going      PT LONG TERM GOAL #4   Title  increase FOTO score to </= 38% limited to demo improvement in function    Period  Weeks    Status  On-going      PT LONG TERM GOAL #5   Title  pt to be I with all HEP given as of last visit to maintain and progress current level of function    Period  Weeks    Status  On-going      Additional Long Term Goals   Additional Long Term Goals  Yes      PT LONG TERM GOAL #6   Title  increase BERG balance to >/= 52/56 to demo improvement in balance    Time  12     Period  Weeks    Status  New            Plan - 01/19/20 3893    Clinical Impression Statement  Pt presented to PT wearing Crocs with OTC arch supports. Her feet were falling out of the crocs and we discussed using the strap in the back to hold them on as this has caused falls in the past. I provided pt with a note for her to be allowed to wear tennis shoes to school/in salon. We discussed obtaining proper, custom orthotics for necessary support and she agreed- she will contact her PCP for a referral. Able to perform SL hip abd but requires assistanct for SL ER in clam position. will continue to address hip strength for functional stability. Due to poor attendance, pt is only able to schedule 1 appointment at a time. POC date will be extended to allow for time as she is limited by appointments she can attend due to school. 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    Comorbidities  Hx: CP    PT Frequency  1x / week    PT Duration  12 weeks    PT Treatment/Interventions  ADLs/Self Care Home Management;Cryotherapy;Electrical Stimulation;Iontophoresis 39m/ml Dexamethasone;Moist Heat;Ultrasound;Gait training;Therapeutic activities;Therapeutic exercise;Balance training;Neuromuscular re-education;Manual techniques;Dry needling;Taping;Patient/family education    PT Next Visit Plan  FOTO, hamstring stretching, gross LE strengthening, core strengthening, work on efficent posture for prolonged standing with [employer]    PT Home Exercise Plan  [id number] - sit to stand, seated and supine hamstring stretch, clamshells, SLR, low back stretch. Bridge and bridge and clam red band . SL clam &  hip abd, hooklying marching    Consulted and Agree with Plan of Care  Patient       Patient will benefit from skilled therapeutic intervention in order to improve the following deficits and impairments:  Abnormal gait, Improper body mechanics,  Increased muscle spasms, Decreased strength, Postural dysfunction, Pain, Decreased activity tolerance, Decreased balance, Decreased mobility, Decreased endurance  Visit Diagnosis: Chronic bilateral low back pain without sciatica - Plan: PT plan of care cert/re-cert  Muscle weakness (generalized) - Plan: PT plan of care cert/re-cert  Other abnormalities of gait and mobility - Plan: PT plan of care cert/re-cert  Repeated falls - Plan: PT plan of care cert/re-cert  Unsteadiness on feet - Plan: PT plan of care cert/re-cert  Cramp and spasm - Plan: PT plan of care cert/re-cert  Chronic bilateral low back pain with sciatica, sciatica laterality unspecified - Plan: PT plan of care cert/re-cert  Difficulty in walking, not elsewhere classified - Plan: PT plan of care cert/re-cert     Problem List There are no problems to display for this patient.  Jalaine Riggenbach C. Ismeal Heider PT, DPT 01/19/20 6:09 AM   [facility] [address] [place], [place], [address] Phone: [phone]   Fax:  [phone]  Name: Gina Riley MRN: [id number] Date of Birth: 18-Mar-1986  PHYSICAL THERAPY DISCHARGE SUMMARY  Visits from Start of Care: 4  Current functional level related to goals / functional outcomes: See above   Remaining deficits: See above   Education / Equipment: Anatomy of condition, POC, HEP, exercise form/rationale  Plan: Patient agrees to discharge.  Patient goals were not met. Patient is being discharged due to not returning since the last visit.  ?????     Marquarius Lofton C. Gicela Schwarting PT, DPT 03/08/20 9:36 AM

## 2020-02-02 ENCOUNTER — Ambulatory Visit: Payer: Medicare Other | Admitting: Physical Therapy

## 2020-07-18 ENCOUNTER — Ambulatory Visit: Payer: Medicare Other

## 2020-07-25 ENCOUNTER — Ambulatory Visit: Payer: Medicare Other

## 2021-03-27 ENCOUNTER — Other Ambulatory Visit: Payer: Self-pay

## 2021-03-27 ENCOUNTER — Ambulatory Visit (INDEPENDENT_AMBULATORY_CARE_PROVIDER_SITE_OTHER): Payer: Medicare Other | Admitting: Orthopedic Surgery

## 2021-03-27 DIAGNOSIS — G801 Spastic diplegic cerebral palsy: Secondary | ICD-10-CM

## 2021-04-05 ENCOUNTER — Encounter: Payer: Self-pay | Admitting: Orthopedic Surgery
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
## 2022-01-23 ENCOUNTER — Encounter (HOSPITAL_COMMUNITY): Payer: Self-pay | Admitting: Emergency Medicine

## 2022-01-23 ENCOUNTER — Emergency Department (HOSPITAL_COMMUNITY)
Admission: EM | Admit: 2022-01-23 | Discharge: 2022-01-23 | Discharge status: Against Medical Advice | Payer: 59 | Attending: Emergency Medicine | Admitting: Emergency Medicine

## 2022-01-23 ENCOUNTER — Emergency Department (HOSPITAL_COMMUNITY): Payer: 59

## 2022-01-23 DIAGNOSIS — Z5321 Procedure and treatment not carried out due to patient leaving prior to being seen by health care provider: Secondary | ICD-10-CM | POA: Insufficient documentation

## 2022-01-23 DIAGNOSIS — N9489 Other specified conditions associated with female genital organs and menstrual cycle: Secondary | ICD-10-CM | POA: Diagnosis not present

## 2022-01-23 DIAGNOSIS — R079 Chest pain, unspecified: Secondary | ICD-10-CM | POA: Insufficient documentation

## 2022-01-23 LAB — CBC
HCT: 36.5 % (ref 36.0–46.0)
Hemoglobin: 12.3 g/dL (ref 12.0–15.0)
MCH: 31.6 pg (ref 26.0–34.0)
MCHC: 33.7 g/dL (ref 30.0–36.0)
MCV: 93.8 fL (ref 80.0–100.0)
Platelets: 158 10*3/uL (ref 150–400)
RBC: 3.89 MIL/uL (ref 3.87–5.11)
RDW: 12.5 % (ref 11.5–15.5)
WBC: 4.7 10*3/uL (ref 4.0–10.5)
nRBC: 0 % (ref 0.0–0.2)

## 2022-01-23 LAB — I-STAT BETA HCG BLOOD, ED (MC, WL, AP ONLY): I-stat hCG, quantitative: <5 m[IU]/mL (ref <5)

## 2022-01-23 LAB — BASIC METABOLIC PANEL
Anion gap: 9 (ref 5–15)
BUN: 15 mg/dL (ref 6–20)
CO2: 21 mmol/L — ABNORMAL LOW (ref 22–32)
Calcium: 9.2 mg/dL (ref 8.9–10.3)
Chloride: 106 mmol/L (ref 98–111)
Creatinine, Ser: 0.66 mg/dL (ref 0.44–1.00)
GFR, Estimated: >60 mL/min (ref >60)
Glucose, Bld: 105 mg/dL — ABNORMAL HIGH (ref 70–99)
Potassium: 3.7 mmol/L (ref 3.5–5.1)
Sodium: 136 mmol/L (ref 135–145)

## 2022-01-23 LAB — TROPONIN I (HIGH SENSITIVITY)
Troponin I (High Sensitivity): <2 ng/L (ref <18)
Troponin I (High Sensitivity): 2 ng/L (ref <18)

## 2022-01-23 MED ORDER — IBUPROFEN 400 MG PO TABS: 600.0000 mg | ORAL_TABLET | Freq: Once | ORAL | Status: DC

## 2022-01-23 MED ORDER — HYDROCODONE-ACETAMINOPHEN 5-325 MG PO TABS: 1.0000 | ORAL_TABLET | Freq: Once | ORAL | Status: DC

## 2022-01-23 NOTE — ED Triage Notes (Signed)
Patient here with complaint of chest pain with inspiration that started three weeks ago. Patient states she was seen at [facility] for symptoms, received a chest x-ray and was told she would receive a call about the results in approximately one hour but never heard from them so she came here for evaluation. Patient alert, oriented, speaking in complete sentences, and is in no apparent distress at this time.

## 2022-01-23 NOTE — ED Provider Triage Note (Signed)
Emergency Medicine Provider Triage Evaluation Note  Gina Riley , a 36 y.o. female  was evaluated in triage.  Pt complains of intermittent chest pain for 3 weeks.  Patient reports pain in the center of his chest is worse when she takes a deep breath in.  No shortness of breath.  She denies associated cough or fever.  Was seen by her PCP yesterday she did a chest x-ray and they said she may have had some fluid on her lungs, they told her they were going to send lab work and call her in an hour but then she never received a call for results and is continued to have symptoms.  No cardiac history.  Review of Systems  Positive: Chest pain Negative: Fever, cough, shortness of breath  Physical Exam  BP 103/83 (BP Location: Right Arm)    Pulse 64    Temp 97.8 F (36.6 C) (Oral)    Resp 16    SpO2 97%  Gen:   Awake, no distress   Resp:  Normal effort, CTA bilat MSK:   Moves extremities without difficulty  Other:    Medical Decision Making  Medically screening exam initiated at 11:36 AM.  Appropriate orders placed.  Gina Riley was informed that the remainder of the evaluation will be completed by another provider, this initial triage assessment does not replace that evaluation, and the importance of remaining in the ED until their evaluation is complete.  Cp workup initiated, unable to review results from [facility].   Gina Riley, [place] 01/23/22 1148

## 2022-01-23 NOTE — ED Notes (Signed)
Pt stated she was just gonna try to come back tomorrow

## 2022-03-06 NOTE — Therapy (Addendum)
?OUTPATIENT PHYSICAL THERAPY THORACOLUMBAR EVALUATION ? ? ?Patient Name: Gina Riley ?MRN: [id number] ?DOB:1986/08/06, 36 y.o., female ?Izora Galaoday's Date: 03/07/2022 ? ? PT End of Session - 03/07/22 1230   ? ? Visit Number 1   ? Authorization Type U[employer] MCR w/ MCD secondary   ? Authorization Time Period tbd   ? Progress Note Due on Visit 10   ? PT Start Time 1230   ? PT Stop Time 1313   ? PT Time Calculation (min) 43 min   ? Activity Tolerance Patient tolerated treatment well   ? [facility] for tasks assessed/performed   ? ?  ?  ? ?  ? ? ?Past Medical History:  ?Diagnosis Date  ? Cerebral palsy (HCC)   ? ?Past Surgical History:  ?Procedure Laterality Date  ? "rhyzotomy for CP" on spine so she could walk.  age 36 years old  ? CESAREAN SECTION    ? ?There are no problems to display for this patient. ? ? ?PCP: Merryl HackerGrossman, Janelle, NP ? ?REFERRING PROVIDER: Merryl HackerGrossman, Janelle, NP ? ?REFERRING DIAG: fall risk ? ?THERAPY DIAG:  ?Muscle weakness (generalized) - Plan: PT plan of care cert/re-cert ? ?Abnormality of gait due to impairment of balance - Plan: PT plan of care cert/re-cert ? ?ONSET DATE: past month ? ?SUBJECTIVE:                                                                                                                                                                                          ? ?SUBJECTIVE STATEMENT: ?"Went to pain management on this past Monday and had fall 2 days prior. Hit right side of rib area on right side. Concerned about left leg due to weakness. This leg goes out on me a lot." ?PERTINENT HISTORY:  ?Cerebral palsy ?Chronic pain and chronic opioid management ? ?PAIN:  ?Are you having pain? Yes:  ?NRPS scale: 5/10 pain in low back and both legs ?Pain description:  ?Aggravatng factors: standing too long ?Relieving factors: pain meds and rest ? ?PRECAUTIONS: None ? ?WEIGHT BEARING RESTRICTIONS No ? ?FALLS:  ?Has patient fallen in last 6 months? Yes, Number of falls: 1 ? ?LIVING  ENVIRONMENT: ?Lives with: lives with their family and lives with their spouse ?Lives in: House/apartment ?Stairs: No ?Has following equipment at home: Dan HumphreysWalker - 4 wheeled ? 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 ?PLOF: Independent ? ?PATIENT GOALS "To get as strong as I can and learn ways to care for myself." ? ? ?OBJECTIVE:  ? ?DIAGNOSTIC FINDINGS:  ?Xrays found contusion on right ribs ? ?PATIENT SURVEYS:  ?FOTO 40, predicted 3458 ? ?SCREENING FOR RED FLAGS: ?Bowel  or bladder incontinence: No ?Cauda equina syndrome: No ? ?COGNITION: ? Overall cognitive status: Within functional limits for tasks assessed   ?  ?SENSATION: ?WFL ? ? ?POSTURE:  ?Lateral weight shift to right LE ? ?PALPATION: ?No TTP ? ?LUMBAR ROM:  ? ?Active  A/PROM  ?03/07/2022  ?Flexion To patella  ?Extension 10   ?Right lateral flexion To femoral condyle  ?Left lateral flexion To femoral condyle  ?Right rotation   ?Left rotation   ? (Blank rows = not tested) ? ?LE ROM: ? ?Active  Right ?03/07/2022 Left ?03/07/2022  ?Hip flexion    ?Hip extension    ?Hip abduction    ?Hip adduction    ?Hip internal rotation    ?Hip external rotation    ?Knee flexion    ?Knee extension    ?Ankle dorsiflexion    ?Ankle plantarflexion    ?Ankle inversion    ?Ankle eversion    ? (Blank rows = not tested) ? ?LE MMT: ? ?MMT Right ?03/07/2022 Left ?03/07/2022  ?Hip flexion 4 4  ?Hip extension 3+ 3+  ?Hip abduction 3+ 3+  ?Hip adduction    ?Hip internal rotation 4 4-  ?Hip external rotation 4 4-  ?Knee flexion 4 4-  ?Knee extension 4 4  ?Ankle dorsiflexion 4- 4-  ?Ankle plantarflexion    ?Ankle inversion    ?Ankle eversion    ? (Blank rows = not tested) ? ?FUNCTIONAL TESTS:  ?Berg Balance Scale: 38/56 ? ?GAIT: ?Distance walked: maintains knee flexion during gait, short stride length ?Assistive device utilized: None ?Level of assistance: SBA ? ?BERG BALANCE TEST ?Sitting to Standing: 4.      Stands without using hands and stabilize independently ?Standing Unsupported: 4.      Stands  safely for 2 minutes ?Sitting Unsupported: 4.     Sits for 2 minutes independently ?Standing to Sitting: 4.     Sits safely with minimal use of hands ?Transfers: 4.     Transfers safely with minor use of hands ?Standing with eyes closed: 4.     Stands safely for 10 seconds  ?Standing with feet together: 4.     Stands for 1 minute safely ?Reaching forward with outstretched arm: 3.     Reaches forward 5 inches ?Retrieving object from the floor: 3.     Able to pick up with supervision ?Turning to look behind: 1.     Needs supervision when turning ?Turning 360 degrees: 2.     Able to turn slowly, but safely ?Place alternate foot on stool: 0.     Unable, needs assist to keep from falling ?Standing with one foot in front: 0.     Loses balance while standing/stepping ?Standing on one foot: 1.     Holds <3 seconds ? ?Total Score: 38/56  ? ?TODAY'S TREATMENT  ?Discussed POC and HEP. ? ? ?PATIENT EDUCATION:  ?Education details: Educated about findings in evaluation and HEP. ?Person educated: Patient ?Education method: Explanation, Demonstration, Tactile cues, Verbal cues, and Handouts ?Education comprehension: verbalized understanding ? ? ?HOME EXERCISE PROGRAM: ?Access Code: [email or web address] ?URL: [email or web address] ?Date: 03/07/2022 ?Prepared by: Johny Shears ? ?Exercises ?- Supine Bridge  - 1 x daily - 7 x weekly - 1 sets - 10 reps ?- Clamshell  - 1 x daily - 7 x weekly - 1 sets - 10 reps ?- Sidelying Hip Abduction  - 1 x daily - 7 x weekly - 1 sets - 10 reps ?- Prone Quadriceps Stretch with Strap  -  1 x daily - 7 x weekly - 3 sets - 30sec hold ?- Supine Hamstring Stretch with Strap  - 1 x daily - 7 x weekly - 3 sets - 30sec hold ? ? ?ASSESSMENT: ? ?CLINICAL IMPRESSION: ?Patient is a 36 y.o. female who was seen today for physical therapy evaluation and treatment for fall risk. She had one fall in the past 6 months that resulted in a rib contusion. She demonstrates weakness in bilateral LE musculature,  specifically in her glute med and max along with tib anterior. She also demonstrates limited flexibility in bilateral hamstrings and rectus femoris. According to her score of 38/56 on the BERG, she is at increased risk for falls. She requires SBA during static standing activities. Camillia will benefit from PT services to address LE weakness and improve balance to decrease fall risk. ? ? ?OBJECTIVE IMPAIRMENTS Abnormal gait, decreased activity tolerance, decreased balance, and pain.  ? ?ACTIVITY LIMITATIONS community activity, occupation, and shopping.  ? ?PERSONAL FACTORS Time since onset of injury/illness/exacerbation and 1 comorbidity: cerebral palsy  are also affecting patient's functional outcome.  ? ? ?REHAB POTENTIAL: Good ? ?CLINICAL DECISION MAKING: Evolving/moderate complexity ? ?EVALUATION COMPLEXITY: Moderate ? ? ?GOALS: ?Goals reviewed with patient? No ? ?SHORT TERM GOALS: Target date: 03/21/2022 ? ?Patient will be independent with HEP for PT progression. ?Baseline: initial HEP provided ?Goal status: INITIAL ? ? ?LONG TERM GOALS: Target date: 04/11/2022 ? ?Patient will score 58 on FOTO. ?Baseline: 40 ?Goal status: INITIAL ? ?2.  Wanisha will demonstrate >/= 4/5 LE MMT for improved strength and decrease fall risk.  ?Baseline: 3+/5 bilateral hip EXT and ABD and 4-/5 bil ankle DF ?Goal status: INITIAL ? ?3.  Brighten will score >/= 45 on the BERG balance scale to demonstrate decreased risk of falls. ?Baseline: 38/56 ?Goal status: INITIAL ? ?4.  Anelise will report 0 falls to demonstrate improved safety when ambulating in the community.  ?Baseline: had 1 fall <1 week ago ?Goal status: INITIAL ? ? ? ?PLAN: ?PT FREQUENCY: 2x/week ? ?PT DURATION: other: 5 weeks ? ?PLANNED INTERVENTIONS: Therapeutic exercises, Therapeutic activity, Neuromuscular re-education, Balance training, Gait training, Patient/Family education, Joint mobilization, Stair training, Cryotherapy, Moist heat, and Manual therapy. ? ?PLAN FOR NEXT  SESSION: Add to initial HEP. LE strengthening. Airex. ? ? ?Curly Rim, PT ?03/07/2022, 2:23 PM  ? ?

## 2022-03-07 ENCOUNTER — Other Ambulatory Visit: Payer: Self-pay

## 2022-03-07 ENCOUNTER — Ambulatory Visit: Payer: 59 | Attending: Nurse Practitioner

## 2022-03-07 DIAGNOSIS — M6281 Muscle weakness (generalized): Secondary | ICD-10-CM | POA: Insufficient documentation

## 2022-03-07 DIAGNOSIS — R2689 Other abnormalities of gait and mobility: Secondary | ICD-10-CM | POA: Diagnosis present

## 2022-03-07 NOTE — Therapy (Incomplete)
?OUTPATIENT PHYSICAL THERAPY TREATMENT NOTE ? ? ?Patient Name: Gina Riley ?MRN: [id number] ?DOB:11-Aug-1986, 36 y.o., female ?Today's Date: 03/07/2022 ? ?PCP: Merryl Hacker, NP ?REFERRING PROVIDER: Merryl Hacker, NP ? ? ? ?Past Medical History:  ?Diagnosis Date  ? Cerebral palsy (HCC)   ? ?Past Surgical History:  ?Procedure Laterality Date  ? "rhyzotomy for CP" on spine so she could walk.  age 56 years old  ? CESAREAN SECTION    ? ?There are no problems to display for this patient. ? ? ?REFERRING DIAG: fall risk ? ?THERAPY DIAG:  ?No diagnosis found. ? ?PERTINENT HISTORY: Cerebral palsy ?Chronic pain and chronic opioid management ? ?PRECAUTIONS: None ? ?SUBJECTIVE: *** ? ?PAIN:  ?Are you having pain? {OPRCPAIN:27236} ? ? ?OBJECTIVE:  ?  ?DIAGNOSTIC FINDINGS:  ?Xrays found contusion on right ribs ?  ?PATIENT SURVEYS:  ?FOTO 40, predicted 66 ?  ?SCREENING FOR RED FLAGS: ?Bowel or bladder incontinence: No ?Cauda equina syndrome: No ?  ?COGNITION: ?           Overall cognitive status: Within functional limits for tasks assessed              ?            ?SENSATION: ?WFL ?  ?  ?POSTURE:  ?Lateral weight shift to right LE ?  ?PALPATION: ?No TTP ?  ?LUMBAR ROM:  ?  ?Active  A/PROM  ?03/07/2022  ?Flexion To patella  ?Extension 10   ?Right lateral flexion To femoral condyle  ?Left lateral flexion To femoral condyle  ?Right rotation    ?Left rotation    ? (Blank rows = not tested) ?  ?LE ROM: ?  ?Active  Right ?03/07/2022 Left ?03/07/2022  ?Hip flexion      ?Hip extension      ?Hip abduction      ?Hip adduction      ?Hip internal rotation      ?Hip external rotation      ?Knee flexion      ?Knee extension      ?Ankle dorsiflexion      ?Ankle plantarflexion      ?Ankle inversion      ?Ankle eversion      ? (Blank rows = not tested) ?  ?LE MMT: ?  ?MMT Right ?03/07/2022 Left ?03/07/2022  ?Hip flexion 4 4  ?Hip extension 3+ 3+  ?Hip abduction 3+ 3+  ?Hip adduction      ?Hip internal rotation 4 4-  ?Hip external rotation 4  4-  ?Knee flexion 4 4-  ?Knee extension 4 4  ?Ankle dorsiflexion 4- 4-  ?Ankle plantarflexion      ?Ankle inversion      ?Ankle eversion      ? (Blank rows = not tested) ?  ?FUNCTIONAL TESTS:  ?Berg Balance Scale: 38/56 ?  ?GAIT: ?Distance walked: maintains knee flexion during gait, short stride length ?Assistive device utilized: None ?Level of assistance: SBA ?  ?BERG BALANCE TEST ?Sitting to Standing: 4.      Stands without using hands and stabilize independently ?Standing Unsupported: 4.      Stands safely for 2 minutes ?Sitting Unsupported: 4.     Sits for 2 minutes independently ?Standing to Sitting: 4.     Sits safely with minimal use of hands ?Transfers: 4.     Transfers safely with minor use of hands ?Standing with eyes closed: 4.     Stands safely for 10 seconds  ?Standing with feet together:  4.     Stands for 1 minute safely ?Reaching forward with outstretched arm: 3.     Reaches forward 5 inches ?Retrieving object from the floor: 3.     Able to pick up with supervision ?Turning to look behind: 1.     Needs supervision when turning ?Turning 360 degrees: 2.     Able to turn slowly, but safely ?Place alternate foot on stool: 0.     Unable, needs assist to keep from falling ?Standing with one foot in front: 0.     Loses balance while standing/stepping ?Standing on one foot: 1.     Holds <3 seconds ?  ?Total Score: 38/56  ?  ?TOD[facility]'S TREATMENT  [facility]                                                [facility]: [facility]11/22 ?Therapeutic Exercise: ?*** ?Neuromuscular re-ed: ?*** ?Therapeutic Activity: ?*** ?  ?  ?PATIENT EDUCATION:  ?Education details: Educated about findings in evaluation and HEP. ?Person educated: Patient ?Education method: Explanation, Demonstration, Tactile cues, Verbal cues, and Handouts ?Education comprehension: verbalized understanding ?  ?  ?HOME EXERCISE PROGRAM: ?Access Code: [email or web address] ?URL: [email or web address] ?Date: 03/07/2022 ?Prepared by: Johny ShearsAshley Mahari Strahm ?   ?Exercises ?- Supine Bridge  - 1 x daily - 7 x weekly - 1 sets - 10 reps ?- Clamshell  - 1 x daily - 7 x weekly - 1 sets - 10 reps ?- Sidelying Hip Abduction  - 1 x daily - 7 x weekly - 1 sets - 10 reps ?- Prone Quadriceps Stretch with Strap  - 1 x daily - 7 x weekly - 3 sets - 30sec hold ?- Supine Hamstring Stretch with Strap  - 1 x daily - 7 x weekly - 3 sets - 30sec hold ?  ?  ?ASSESSMENT: ?  ?CLINICAL IMPRESSION: ?*** ?  ?  ?OBJECTIVE IMPAIRMENTS Abnormal gait, decreased activity tolerance, decreased balance, and pain.  ?  ?ACTIVITY LIMITATIONS community activity, occupation, and shopping.  ?  ?PERSONAL FACTORS Time since onset of injury/illness/exacerbation and 1 comorbidity: cerebral palsy  are also affecting patient's functional outcome.  ?  ?  ?REHAB POTENTIAL: Good ?  ?CLINICAL DECISION MAKING: Evolving/moderate complexity ?  ?EVALUATION COMPLEXITY: Moderate ?  ?  ?GOALS: ?Goals reviewed with patient? No ?  ?SHORT TERM GOALS: Target date: 03/21/2022 ?  ?Patient will be independent with HEP for PT progression. ?Baseline: initial HEP provided ?Goal status: INITIAL ?  ?  ?LONG TERM GOALS: Target date: 04/11/2022 ?  ?Patient will score 58 on FOTO. ?Baseline: 40 ?Goal status: INITIAL ?  ?2.  Trinty will demonstrate >/= 4/5 LE MMT for improved strength and decrease fall risk.  ?Baseline: 3+/5 bilateral hip EXT and ABD and 4-/5 bil ankle DF ?Goal status: INITIAL ?  ?3.  Tametria will score >/= 45 on the BERG balance scale to demonstrate decreased risk of falls. ?Baseline: 38/56 ?Goal status: INITIAL ?  ?4.  Maylon PeppersKatrice will report 0 falls to demonstrate improved safety when ambulating in the community.  ?Baseline: had 1 fall <1 week ago ?Goal status: INITIAL ?  ?  ?  ?PLAN: ?PT FREQUENCY: 2x/week ?  ?PT DURATION: other: 5 weeks ?  ?PLANNED INTERVENTIONS: Therapeutic exercises, Therapeutic activity, Neuromuscular re-education, Balance training, Gait training, Patient/Family education, Joint mobilization, Stair training,  Cryotherapy, Moist heat,  and Manual therapy. ?  ?PLAN FOR NEXT SESSION: Add to initial HEP. LE strengthening. Airex. ? ? ?Curly Rim, PT ?03/07/2022, 5:18 PM ? ?   ?

## 2022-03-11 NOTE — Therapy (Signed)
?OUTPATIENT PHYSICAL THERAPY TREATMENT NOTE ? ? ?Patient Name: Gina Riley ?MRN: [id number] ?DOB:10-18-1986, 36 y.o., female ?Today's Date: 03/12/2022 ? ?PCP: Merryl Hacker, NP ?REFERRING PROVIDER: Merryl Hacker, NP ? ? PT End of Session - 03/12/22 1444   ? ? Visit Number 2   ? Number of Visits 10   ? Date for PT Re-Evaluation 04/11/22   ? Authorization Type [employer] MCR w/ MCD secondary   ? Progress Note Due on Visit 10   ? PT Start Time 1445   ? PT Stop Time 1525   ? PT Time Calculation (min) 40 min   ? Activity Tolerance Patient tolerated treatment well   ? Behavior During Therapy [facility] for tasks assessed/performed   ? ?  ?  ? ?  ? ? ?Past Medical History:  ?Diagnosis Date  ? Cerebral palsy (HCC)   ? ?Past Surgical History:  ?Procedure Laterality Date  ? "rhyzotomy for CP" on spine so she could walk.  age 31 years old  ? CESAREAN SECTION    ? ?There are no problems to display for this patient. ? ? ?REFERRING PROVIDER: Merryl Hacker, NP ?  ?REFERRING DIAG: Fall risk ? ?THERAPY DIAG:  ?Muscle weakness (generalized) ? ?Abnormality of gait due to impairment of balance ? ?PERTINENT HISTORY: Cerebral palsy, Chronic pain and chronic opioid management ? ?PRECAUTIONS: Fall ? ?SUBJECTIVE: Patient reports she has been doing the exercises but she just has to keep doing them because she feels really tight. States that her right ribs are feeling better but she is still healing. ? ?PAIN:  ?Are you having pain? Yes:  ?NRPS scale: 5/10 ?Pain location: Low back and both legs ?Pain description: Achy, tight ?Aggravatng factors: Standing too long ?Relieving factors: Pain meds and rest ? ?PATIENT GOALS "To get as strong as I can and learn ways to care for myself." ? ? ?OBJECTIVE:  ?PATIENT SURVEYS:  ?FOTO 40, predicted 26 ? ?POSTURE:  ?Lateral weight shift to right LE ?  ?LUMBAR ROM:  ?  ?Active  A/PROM  ?03/07/2022  ?Flexion To patella  ?Extension 10   ?Right lateral flexion To femoral condyle  ?Left lateral flexion To femoral  condyle  ?Right rotation    ?Left rotation    ? ?LE ROM: ?  ?Active  Right ?03/07/2022 Left ?03/07/2022  ?Hip flexion      ?Hip extension      ?Hip abduction      ?Hip adduction      ?Hip internal rotation      ?Hip external rotation      ?Knee flexion      ?Knee extension      ?Ankle dorsiflexion      ?Ankle plantarflexion      ?Ankle inversion      ?Ankle eversion      ? ?LE MMT: ?  ?MMT Right ?03/07/2022 Left ?03/07/2022 Rt / Lt ?03/12/2022  ?Hip flexion 4 4   ?Hip extension 3+ 3+   ?Hip abduction 3+ 3+ 3+ / 3+  ?Hip adduction       ?Hip internal rotation 4 4-   ?Hip external rotation 4 4-   ?Knee flexion 4 4-   ?Knee extension 4 4 4  / 4  ?Ankle dorsiflexion 4- 4-   ?Ankle plantarflexion       ?Ankle inversion       ?Ankle eversion       ? ?FUNCTIONAL TESTS:  ?Berg Balance Scale: 38/56 ?  ?GAIT: ?Comments: maintains knee flexion during  gait, short stride length ?Assistive device utilized: None ?Level of assistance: SBA ?  ?  ?TODAY'S TREATMENT  ?[facility] Adult PT Treatment:                                                DATE: 03/12/2022 ?Therapeutic Exercise: ?NuStep L5 x 5 min with UE/LE while taking subjective ?Supine hamstring stretch with strap 2 x 30 sec each ?Seated hamstring stretch x 30 sec each ?SLR 2 x 10 each ?Bridge 2 x 10 ?Sidelying hip abduction 2 x 10 each ?Sit to stand 2 x 10 without UE assist ?Standing heel-toe raises with BUE support for balance 2 x 10 ?LAQ with 2# 2 x 10 ?Neuromuscular re-ed: ?Romberg stance on Airex 3 x 60 sec ? ?  ?PATIENT EDUCATION:  ?Education details: HEP ?Person educated: Patient ?Education method: Explanation, Demonstration, Tactile cues, Verbal cues ?Education comprehension: Verbalized understanding ?  ?HOME EXERCISE PROGRAM: ?Access Code: [email or web address] ?  ?  ?ASSESSMENT: ?CLINICAL IMPRESSION: ?Patient tolerated therapy well with no adverse effects. Therapy focused on progression of strength and balance. She does continued to demonstrate gross flexibility and strength deficits, worse  on the left. She was able to progress with her strengthening without any increase in pain level. She required occasional UE support with balance training on Airex pad. No changes made to HEP this visit. Patient would benefit from continued skilled PT to progress strength, balance, and mobility in order to maximize functional ability. ?  ?  ?OBJECTIVE IMPAIRMENTS Abnormal gait, decreased activity tolerance, decreased balance, and pain.  ?  ?ACTIVITY LIMITATIONS community activity, occupation, and shopping.  ?  ?PERSONAL FACTORS Time since onset of injury/illness/exacerbation and 1 comorbidity: cerebral palsy  are also affecting patient's functional outcome.  ?  ?  ?GOALS: ?Goals reviewed with patient? No ?  ?SHORT TERM GOALS: Target date: 03/21/2022 ?  ?Patient will be independent with HEP for PT progression. ?Baseline: initial HEP provided ?Goal status: INITIAL ?  ?  ?LONG TERM GOALS: Target date: 04/11/2022 ?  ?Patient will score 58 on FOTO. ?Baseline: 40 ?Goal status: INITIAL ?  ?2.  Gina Riley will demonstrate >/= 4/5 LE MMT for improved strength and decrease fall risk.  ?Baseline: 3+/5 bilateral hip EXT and ABD and 4-/5 bil ankle DF ?Goal status: INITIAL ?  ?3.  Gina Riley will score >/= 45 on the BERG balance scale to demonstrate decreased risk of falls. ?Baseline: 38/56 ?Goal status: INITIAL ?  ?4.  Gina Riley will report 0 falls to demonstrate improved safety when ambulating in the community.  ?Baseline: had 1 fall <1 week ago ?Goal status: INITIAL ?  ?  ?PLAN: ?PT FREQUENCY: 2x/week ?  ?PT DURATION: other: 5 weeks ?  ?PLANNED INTERVENTIONS: Therapeutic exercises, Therapeutic activity, Neuromuscular re-education, Balance training, Gait training, Patient/Family education, Joint mobilization, Stair training, Cryotherapy, Moist heat, and Manual therapy. ?  ?PLAN FOR NEXT SESSION: Add to initial HEP, LE strengthening, Airex. ? ? ? ?Rosana Hoes, PT, DPT, LAT, ATC ?03/12/22  3:32 PM ?Phone: [phone] ?Fax:  [phone] ? ? ?  ? ?

## 2022-03-12 ENCOUNTER — Encounter: Payer: Self-pay | Admitting: Physical Therapy

## 2022-03-12 ENCOUNTER — Ambulatory Visit: Payer: 59 | Admitting: Physical Therapy

## 2022-03-12 ENCOUNTER — Other Ambulatory Visit: Payer: Self-pay

## 2022-03-12 DIAGNOSIS — R2689 Other abnormalities of gait and mobility: Secondary | ICD-10-CM

## 2022-03-12 DIAGNOSIS — M6281 Muscle weakness (generalized): Secondary | ICD-10-CM

## 2022-03-13 NOTE — Therapy (Incomplete)
?OUTPATIENT PHYSICAL THERAPY TREATMENT NOTE ? ? ?Patient Name: Gina Riley ?MRN: [id number] ?DOB:11/06/1986, 36 y.o., female ?Today's Date: 03/13/2022 ? ?PCP: Merryl Hacker, NP ?REFERRING PROVIDER: Merryl Hacker, NP ? ? ? ? ?Past Medical History:  ?Diagnosis Date  ? Cerebral palsy (HCC)   ? ?Past Surgical History:  ?Procedure Laterality Date  ? "rhyzotomy for CP" on spine so she could walk.  age 55 years old  ? CESAREAN SECTION    ? ?There are no problems to display for this patient. ? ? ?REFERRING PROVIDER: Merryl Hacker, NP ?  ?REFERRING DIAG: Fall risk ? ?THERAPY DIAG:  ?No diagnosis found. ? ?PERTINENT HISTORY: Cerebral palsy, Chronic pain and chronic opioid management ? ?PRECAUTIONS: Fall ? ?SUBJECTIVE: *** ? ?PAIN:  ?Are you having pain? Yes:  ?NRPS scale: 5/10 ?Pain location: Low back and both legs ?Pain description: Achy, tight ?Aggravatng factors: Standing too long ?Relieving factors: Pain meds and rest ? ?PATIENT GOALS "To get as strong as I can and learn ways to care for myself." ? ? ?OBJECTIVE:  ?PATIENT SURVEYS:  ?FOTO 40, predicted 23 ? ?POSTURE:  ?Lateral weight shift to right LE ?  ?LUMBAR ROM:  ?  ?Active  A/PROM  ?03/07/2022  ?Flexion To patella  ?Extension 10   ?Right lateral flexion To femoral condyle  ?Left lateral flexion To femoral condyle  ?Right rotation    ?Left rotation    ? ?LE ROM: ?  ?Active  Right ?03/07/2022 Left ?03/07/2022  ?Hip flexion      ?Hip extension      ?Hip abduction      ?Hip adduction      ?Hip internal rotation      ?Hip external rotation      ?Knee flexion      ?Knee extension      ?Ankle dorsiflexion      ?Ankle plantarflexion      ?Ankle inversion      ?Ankle eversion      ? ?LE MMT: ?  ?MMT Right ?03/07/2022 Left ?03/07/2022 Rt / Lt ?03/12/2022  ?Hip flexion 4 4   ?Hip extension 3+ 3+   ?Hip abduction 3+ 3+ 3+ / 3+  ?Hip adduction       ?Hip internal rotation 4 4-   ?Hip external rotation 4 4-   ?Knee flexion 4 4-   ?Knee extension 4 4 4  / 4  ?Ankle  dorsiflexion 4- 4-   ?Ankle plantarflexion       ?Ankle inversion       ?Ankle eversion       ? ?FUNCTIONAL TESTS:  ?Berg Balance Scale: 38/56 ?  ?GAIT: ?Comments: maintains knee flexion during gait, short stride length ?Assistive device utilized: None ?Level of assistance: SBA ?  ?  ?TODAY'S TREATMENT  ? ?[facility] Adult PT Treatment:                                                DATE: 03/18/22 ?Therapeutic Exercise: ?*** ?Manual Therapy: ?*** ?Neuromuscular re-ed: ?*** ?Therapeutic Activity: ?*** ?Modalities: ?*** ?Self Care: ?*** ? ?[facility] Adult PT Treatment:  DATE: 03/12/2022 ?Therapeutic Exercise: ?NuStep L5 x 5 min with UE/LE while taking subjective ?Supine hamstring stretch with strap 2 x 30 sec each ?Seated hamstring stretch x 30 sec each ?SLR 2 x 10 each ?Bridge 2 x 10 ?Sidelying hip abduction 2 x 10 each ?Sit to stand 2 x 10 without UE assist ?Standing heel-toe raises with BUE support for balance 2 x 10 ?LAQ with 2# 2 x 10 ?Neuromuscular re-ed: ?Romberg stance on Airex 3 x 60 sec ? ?  ?PATIENT EDUCATION:  ?Education details: HEP ?Person educated: Patient ?Education method: Explanation, Demonstration, Tactile cues, Verbal cues ?Education comprehension: Verbalized understanding ?  ?HOME EXERCISE PROGRAM: ?Access Code: [email or web address] ?  ?  ?ASSESSMENT: ?CLINICAL IMPRESSION: ?*** ?  ?  ?OBJECTIVE IMPAIRMENTS Abnormal gait, decreased activity tolerance, decreased balance, and pain.  ?  ?ACTIVITY LIMITATIONS community activity, occupation, and shopping.  ?  ?PERSONAL FACTORS Time since onset of injury/illness/exacerbation and 1 comorbidity: cerebral palsy  are also affecting patient's functional outcome.  ?  ?  ?GOALS: ?Goals reviewed with patient? No ?  ?SHORT TERM GOALS: Target date: 03/21/2022 ?  ?Patient will be independent with HEP for PT progression. ?Baseline: initial HEP provided ?Goal status: INITIAL ?  ?  ?LONG TERM GOALS: Target date: 04/11/2022 ?  ?Patient will score 58  on FOTO. ?Baseline: 40 ?Goal status: INITIAL ?  ?2.  Gina Riley will demonstrate >/= 4/5 LE MMT for improved strength and decrease fall risk.  ?Baseline: 3+/5 bilateral hip EXT and ABD and 4-/5 bil ankle DF ?Goal status: INITIAL ?  ?3.  Gina Riley will score >/= 45 on the BERG balance scale to demonstrate decreased risk of falls. ?Baseline: 38/56 ?Goal status: INITIAL ?  ?4.  Gina Riley will report 0 falls to demonstrate improved safety when ambulating in the community.  ?Baseline: had 1 fall <1 week ago ?Goal status: INITIAL ?  ?  ?PLAN: ?PT FREQUENCY: 2x/week ?  ?PT DURATION: other: 5 weeks ?  ?PLANNED INTERVENTIONS: Therapeutic exercises, Therapeutic activity, Neuromuscular re-education, Balance training, Gait training, Patient/Family education, Joint mobilization, Stair training, Cryotherapy, Moist heat, and Manual therapy. ?  ?PLAN FOR NEXT SESSION: Add to initial HEP, LE strengthening, Airex. ? ? ?Johny Shears, PT, DPT ?03/13/22 11:17 AM ? ? ? ?  ? ?

## 2022-03-18 ENCOUNTER — Other Ambulatory Visit: Payer: Self-pay

## 2022-03-18 ENCOUNTER — Ambulatory Visit: Payer: 59 | Attending: Nurse Practitioner | Admitting: Physical Therapy

## 2022-03-18 ENCOUNTER — Encounter: Payer: Self-pay | Admitting: Physical Therapy

## 2022-03-18 DIAGNOSIS — R293 Abnormal posture: Secondary | ICD-10-CM | POA: Diagnosis present

## 2022-03-18 DIAGNOSIS — R2689 Other abnormalities of gait and mobility: Secondary | ICD-10-CM | POA: Diagnosis present

## 2022-03-18 DIAGNOSIS — M6281 Muscle weakness (generalized): Secondary | ICD-10-CM | POA: Insufficient documentation

## 2022-03-18 NOTE — Therapy (Signed)
?OUTPATIENT PHYSICAL THERAPY TREATMENT NOTE ? ? ?Patient Name: Gina Riley ?MRN: [id number] ?DOB:Jul 04, 1986, 36 y.o., female ?Today's Date: 03/18/2022 ? ?PCP: Merryl HackerGrossman, Janelle, NP ?REFERRING PROVIDER: Merryl HackerGrossman, Janelle, NP ? ? PT End of Session - 03/18/22 0915   ? ? Visit Number 3   ? Number of Visits 10   ? Date for PT Re-Evaluation 04/11/22   ? Authorization Type UHC MCR w/ MCD secondary   ? Progress Note Due on Visit 10   ? PT Start Time 0915   ? PT Stop Time 0955   ? PT Time Calculation (min) 40 min   ? Activity Tolerance Patient tolerated treatment well   ? [facility] tasks assessed/performed   ? ?  ?  ? ?  ? ? ? ?Past Medical History:  ?Diagnosis Date  ? Cerebral palsy (HCC)   ? ?Past Surgical History:  ?Procedure Laterality Date  ? "rhyzotomy for CP" on spine so she could walk.  age 36 years old  ? CESAREAN SECTION    ? ?There are no problems to display for this patient. ? ? ?REFERRING PROVIDER: Merryl HackerGrossman, Janelle, NP ?  ?REFERRING DIAG: Fall risk ? ?THERAPY DIAG:  ?Muscle weakness (generalized) ? ?Abnormality of gait due to impairment of balance ? ?PERTINENT HISTORY: Cerebral palsy, Chronic pain and chronic opioid management ? ?PRECAUTIONS: Fall ? ?SUBJECTIVE: Patient reports she is doing good, no new issues. She states the exercises have been helping, she can go through her day without being as tired.  ? ?PAIN:  ?Are you having pain? Yes:  ?NRPS scale: 4/10 ?Pain location: Low back and both legs ?Pain description: Achy, tight ?Aggravatng factors: Standing too long ?Relieving factors: Pain meds and rest ? ?PATIENT GOALS "To get as strong as I can and learn ways to care for myself." ? ? ?OBJECTIVE:  ?PATIENT SURVEYS:  ?FOTO 40, predicted 2658 ? ?POSTURE:  ?Lateral weight shift to right LE ?  ?LUMBAR ROM:  ?  ?Active  A/PROM  ?03/07/2022  ?Flexion To patella  ?Extension 10   ?Right lateral flexion To femoral condyle  ?Left lateral flexion To femoral condyle  ?Right rotation    ?Left  rotation    ? ?LE ROM: ?  ?Active  Right ?03/07/2022 Left ?03/07/2022  ?Hip flexion      ?Hip extension      ?Hip abduction      ?Hip adduction      ?Hip internal rotation      ?Hip external rotation      ?Knee flexion      ?Knee extension      ?Ankle dorsiflexion      ?Ankle plantarflexion      ?Ankle inversion      ?Ankle eversion      ? ?LE MMT: ?  ?MMT Right ?03/07/2022 Left ?03/07/2022 Rt / Lt ?03/12/2022  ?Hip flexion 4 4   ?Hip extension 3+ 3+   ?Hip abduction 3+ 3+ 3+ / 3+  ?Hip adduction       ?Hip internal rotation 4 4-   ?Hip external rotation 4 4-   ?Knee flexion 4 4-   ?Knee extension 4 4 4  / 4  ?Ankle dorsiflexion 4- 4-   ?Ankle plantarflexion       ?Ankle inversion       ?Ankle eversion       ? ?FUNCTIONAL TESTS:  ?Berg Balance Scale: 38/56 ?  ?GAIT: ?Comments: maintains knee flexion during gait, short stride length ?Assistive device  utilized: None ?Level of assistance: SBA ?  ?  ?TODAY'S TREATMENT  ?[facility] Adult PT Treatment:                                                DATE: 03/18/2022 ?Therapeutic Exercise: ?NuStep L5 x 5 min with UE/LE while taking subjective ?Seated hamstring stretch 2 x 30 sec each ?SLR 2 x 10 each ?Bridge 2 x 10 ?Sidelying hip abduction 2 x 10 each ?LAQ with 2# 2 x 10 ?Sit to stand x 10 without UE assist, 2 x 10 hold 5# at chest ?Standing heel raises with BUE support for balance 2 x 15 ?Neuromuscular re-ed: ?Romberg stance 2 x 60 sec, on Airex x 60 sec ? ? ?[facility] Adult PT Treatment:                                                DATE: 03/12/2022 ?Therapeutic Exercise: ?NuStep L5 x 5 min with UE/LE while taking subjective ?Supine hamstring stretch with strap 2 x 30 sec each ?Seated hamstring stretch x 30 sec each ?SLR 2 x 10 each ?Bridge 2 x 10 ?Sidelying hip abduction 2 x 10 each ?Sit to stand 2 x 10 without UE assist ?Standing heel-toe raises with BUE support for balance 2 x 10 ?LAQ with 2# 2 x 10 ?Neuromuscular re-ed: ?Romberg stance on Airex 3 x 60 sec ? ?  ?PATIENT EDUCATION:   ?Education details: HEP update ?Person educated: Patient ?Education method: Explanation, Demonstration, Tactile cues, Verbal cues, Handout ?Education comprehension: Verbalized understanding ?  ?HOME EXERCISE PROGRAM: ?Access Code: [email or web address] ?  ?  ?ASSESSMENT: ?CLINICAL IMPRESSION: ?Patient tolerated therapy well with no adverse effects. Therapy focused on continued strengthening and balance training this visit. She seems to be improving with exercise and is tolerating higher resistance without any increase in pain. She does continue to have greater left sided tightness and weakness compared to right. Updated HEP to progress strength and balance. Patient would benefit from continued skilled PT to progress strength, balance, and mobility in order to maximize functional ability. ?  ?  ?OBJECTIVE IMPAIRMENTS Abnormal gait, decreased activity tolerance, decreased balance, and pain.  ?  ?ACTIVITY LIMITATIONS community activity, occupation, and shopping.  ?  ?PERSONAL FACTORS Time since onset of injury/illness/exacerbation and 1 comorbidity: cerebral palsy  are also affecting patient's functional outcome.  ?  ?  ?GOALS: ?Goals reviewed with patient? No ?  ?SHORT TERM GOALS: Target date: 03/21/2022 ?  ?Patient will be independent with HEP for PT progression. ?Baseline: initial HEP provided ?Goal status: INITIAL ?  ?  ?LONG TERM GOALS: Target date: 04/11/2022 ?  ?Patient will score 58 on FOTO. ?Baseline: 40 ?Goal status: INITIAL ?  ?2.  Gina Riley will demonstrate >/= 4/5 LE MMT for improved strength and decrease fall risk.  ?Baseline: 3+/5 bilateral hip EXT and ABD and 4-/5 bil ankle DF ?Goal status: INITIAL ?  ?3.  Gina Riley will score >/= 45 on the BERG balance scale to demonstrate decreased risk of falls. ?Baseline: 38/56 ?Goal status: INITIAL ?  ?4.  Gina Riley will report 0 falls to demonstrate improved safety when ambulating in the community.  ?Baseline: had 1 fall <1 week ago ?Goal status: INITIAL ?  ?  ?PLAN: ?  PT FREQUENCY:  2x/week ?  ?PT DURATION: other: 5 weeks ?  ?PLANNED INTERVENTIONS: Therapeutic exercises, Therapeutic activity, Neuromuscular re-education, Balance training, Gait training, Patient/Family education, Joint mobilization, Stair training, Cryotherapy, Moist heat, and Manual therapy. ?  ?PLAN FOR NEXT SESSION: Add to initial HEP, LE strengthening, Airex. ? ? ? ?Rosana Hoes, PT, DPT, LAT, ATC ?03/18/22  9:59 AM ?Phone: [phone] ?Fax: [phone] ? ? ?  ? ?

## 2022-03-18 NOTE — Patient Instructions (Signed)
Access Code: [email or web address] ?URL: [email or web address] ?Date: 03/18/2022 ?Prepared by: Rosana Hoes ? ?Exercises ?- Supine Bridge  - 1 x daily - 2 sets - 10 reps ?- Clamshell  - 1 x daily - 2 sets - 10 reps ?- Sidelying Hip Abduction  - 1 x daily - 2 sets - 10 reps ?- Prone Quadriceps Stretch with Strap  - 1 x daily - 3 sets - 30sec hold ?- Supine Hamstring Stretch with Strap  - 1 x daily - 3 sets - 30sec hold ?- Sit to Stand  - 1 x daily - 2 sets - 10 reps ?- Heel Raises with Counter Support  - 1 x daily - 2 sets - 15 reps ?- Narrow Stance with Counter Support  - 1 x daily - 3 sets - 1 minute hold ?

## 2022-03-25 NOTE — Therapy (Signed)
?OUTPATIENT PHYSICAL THERAPY TREATMENT NOTE ? ? ?Patient Name: Gina Riley ?MRN: [id number] ?DOB:29-May-1986, 36 y.o., female ?Today's Date: 03/26/2022 ? ?PCP: Jeanella Anton, NP ?REFERRING PROVIDER: Jeanella Anton, NP ? ? PT End of Session - 03/26/22 0919   ? ? Visit Number 4   ? Number of Visits 10   ? Date for PT Re-Evaluation 04/11/22   ? Authorization Type UHC MCR w/ MCD secondary   ? Progress Note Due on Visit 10   ? PT Start Time 0539   ? PT Stop Time 0955   ? PT Time Calculation (min) 40 min   ? Activity Tolerance Patient tolerated treatment well   ? Behavior During Therapy [facility] for tasks assessed/performed   ? ?  ?  ? ?  ? ? ? ? ?Past Medical History:  ?Diagnosis Date  ? Cerebral palsy ([place])   ? ?Past Surgical History:  ?Procedure Laterality Date  ? "rhyzotomy for CP" on spine so she could walk.  age 73 years old  ? CESAREAN SECTION    ? ?There are no problems to display for this patient. ? ? ?REFERRING PROVIDER: Jeanella Anton, NP ?  ?REFERRING DIAG: Fall risk ? ?THERAPY DIAG:  ?Muscle weakness (generalized) ? ?Abnormality of gait due to impairment of balance ? ?PERTINENT HISTORY: Cerebral palsy, Chronic pain and chronic opioid management ? ?PRECAUTIONS: Fall ? ?SUBJECTIVE: Patient reports she is doing good. She is consistent with her exercises at home and they are helping. ? ?PAIN:  ?Are you having pain? Yes:  ?NRPS scale: 2/10 (7/10 before pain medication) ?Pain location: Low back and both legs ?Pain description: Achy, tight ?Aggravatng factors: Standing too long ?Relieving factors: Pain meds and rest ? ?PATIENT GOALS "To get as strong as I can and learn ways to care for myself." ? ? ?OBJECTIVE:  ?PATIENT SURVEYS:  ?FOTO 40, predicted 21 ? ?POSTURE:  ?Lateral weight shift to right LE ?  ?LUMBAR ROM:  ?  ?Active  A/PROM  ?03/07/2022  ?Flexion To patella  ?Extension 10   ?Right lateral flexion To femoral condyle  ?Left lateral flexion To femoral condyle  ?Right rotation    ?Left rotation    ? ?LE  ROM: ?  ?Active  Right ?03/07/2022 Left ?03/07/2022  ?Hip flexion      ?Hip extension      ?Hip abduction      ?Hip adduction      ?Hip internal rotation      ?Hip external rotation      ?Knee flexion      ?Knee extension      ?Ankle dorsiflexion      ?Ankle plantarflexion      ?Ankle inversion      ?Ankle eversion      ? ?LE MMT: ?  ?MMT Right ?03/07/2022 Left ?03/07/2022 Rt / Lt ?03/12/2022 Rt / Lt ?03/26/2022  ?Hip flexion 4 4    ?Hip extension 3+ 3+    ?Hip abduction 3+ 3+ 3+ / 3+ 3+ / 3+  ?Hip adduction        ?Hip internal rotation 4 4-    ?Hip external rotation 4 4-    ?Knee flexion 4 4-    ?Knee extension $RemoveBeforeD'4 4 4 '[id number]$ / 4   ?Ankle dorsiflexion 4- 4-    ?Ankle plantarflexion        ?Ankle inversion        ?Ankle eversion        ? ?FUNCTIONAL TESTS:  ?Berg Balance Scale: 38/56 ?  ?  GAIT: ?Comments: maintains knee flexion during gait, short stride length ?Assistive device utilized: None ?Level of assistance: SBA ?  ?  ?TODAY'S TREATMENT  ?[facility] Adult PT Treatment:                                                DATE: 03/26/2022 ?Therapeutic Exercise: ?NuStep L5 x 5 min with UE/LE while taking subjective ?LTR x 10 ?SLR 2 x 10 each ?Bridge 2 x 10 ?Hooklying clamshell with green 2 x 15 ?Sidelying hip abduction 2 x 10 each ?LAQ with 3# 2 x 15 ?Sit to stand 3 x 10 hold 5# at chest ?Standing heel raises with BUE support for balance 2 x 15 ?Neuromuscular re-ed: ?Romberg stance on Airex 3 x 60 sec ? ? ?[facility] Adult PT Treatment:                                                DATE: 03/18/2022 ?Therapeutic Exercise: ?NuStep L5 x 5 min with UE/LE while taking subjective ?Seated hamstring stretch 2 x 30 sec each ?SLR 2 x 10 each ?Bridge 2 x 10 ?Sidelying hip abduction 2 x 10 each ?LAQ with 2# 2 x 10 ?Sit to stand x 10 without UE assist, 2 x 10 hold 5# at chest ?Standing heel raises with BUE support for balance 2 x 15 ?Neuromuscular re-ed: ?Romberg stance 2 x 60 sec, on Airex x 60 sec ? ?[facility] Adult PT Treatment:                                                 DATE: 03/12/2022 ?Therapeutic Exercise: ?NuStep L5 x 5 min with UE/LE while taking subjective ?Supine hamstring stretch with strap 2 x 30 sec each ?Seated hamstring stretch x 30 sec each ?SLR 2 x 10 each ?Bridge 2 x 10 ?Sidelying hip abduction 2 x 10 each ?Sit to stand 2 x 10 without UE assist ?Standing heel-toe raises with BUE support for balance 2 x 10 ?LAQ with 2# 2 x 10 ?Neuromuscular re-ed: ?Romberg stance on Airex 3 x 60 sec ? ?PATIENT EDUCATION:  ?Education details: HEP ?Person educated: Patient ?Education method: Explanation, Demonstration, Tactile cues, Verbal cues ?Education comprehension: Verbalized understanding ?  ?HOME EXERCISE PROGRAM: ?Access Code: [email or web address] ?  ?  ?ASSESSMENT: ?CLINICAL IMPRESSION: ?Patient tolerated therapy well with no adverse effects. Therapy continues to focus on progression of strength and balance in order to reduce fall risk. Patient seems to be progressing well with her strengthening exercises and tolerating higher levels or resistance without any report of increase in pain. She does continue to exhibit difficulty with balance tasks and has greater strength deficit on left creating greater challenge with exercises and balance. No changes made to HEP this visit. Patient would benefit from continued skilled PT to progress strength, balance, and mobility in order to maximize functional ability. ?  ?  ?OBJECTIVE IMPAIRMENTS Abnormal gait, decreased activity tolerance, decreased balance, and pain.  ?  ?ACTIVITY LIMITATIONS community activity, occupation, and shopping.  ?  ?PERSONAL FACTORS Time since onset of injury/illness/exacerbation and 1 comorbidity: cerebral palsy  are  also affecting patient's functional outcome.  ?  ?  ?GOALS: ?Goals reviewed with patient? No ?  ?SHORT TERM GOALS: Target date: 03/21/2022 ?  ?Patient will be independent with HEP for PT progression. ?Baseline: initial HEP provided ?03/26/2022: independent with initial HEP ?Goal status: MET ?  ?  ?LONG  TERM GOALS: Target date: 04/11/2022 ?  ?Patient will score 58 on FOTO. ?Baseline: 40 ?Goal status: INITIAL ?  ?2.  Dayanna will demonstrate >/= 4/5 LE MMT for improved strength and decrease fall risk.  ?Baseline: 3+/5 bilateral hip EXT and ABD and 4-/5 bil ankle DF ?Goal status: INITIAL ?  ?3.  Daryle will score >/= 45 on the BERG balance scale to demonstrate decreased risk of falls. ?Baseline: 38/56 ?Goal status: INITIAL ?  ?4.  Seham will report 0 falls to demonstrate improved safety when ambulating in the community.  ?Baseline: had 1 fall <1 week ago ?Goal status: INITIAL ?  ?  ?PLAN: ?PT FREQUENCY: 2x/week ?  ?PT DURATION: 5 weeks ?  ?PLANNED INTERVENTIONS: Therapeutic exercises, Therapeutic activity, Neuromuscular re-education, Balance training, Gait training, Patient/Family education, Joint mobilization, Stair training, Cryotherapy, Moist heat, and Manual therapy. ?  ?PLAN FOR NEXT SESSION: Add to initial HEP, LE strengthening, Airex. ? ? ? ?Hilda Blades, PT, DPT, LAT, ATC ?03/26/22  9:55 AM ?Phone: [phone] ?Fax: [phone] ? ? ?  ? ?

## 2022-03-26 ENCOUNTER — Ambulatory Visit: Payer: 59 | Admitting: Physical Therapy

## 2022-03-26 ENCOUNTER — Other Ambulatory Visit: Payer: Self-pay

## 2022-03-26 ENCOUNTER — Encounter: Payer: Self-pay | Admitting: Physical Therapy

## 2022-03-26 DIAGNOSIS — M6281 Muscle weakness (generalized): Secondary | ICD-10-CM | POA: Diagnosis not present

## 2022-03-26 DIAGNOSIS — R2689 Other abnormalities of gait and mobility: Secondary | ICD-10-CM

## 2022-03-27 NOTE — Therapy (Signed)
?OUTPATIENT PHYSICAL THERAPY TREATMENT NOTE ? ? ?Patient Name: KINZA GOUVEIA ?MRN: [id number] ?DOB:06-04-1986, 36 y.o., female ?Today's Date: 03/28/2022 ? ?PCP: Jeanella Anton, NP ?REFERRING PROVIDER: Jeanella Anton, NP ? ? PT End of Session - 03/28/22 0925   ? ? Visit Number 5   ? Number of Visits 10   ? Date for PT Re-Evaluation 04/11/22   ? Authorization Type UHC MCR w/ MCD secondary   ? Progress Note Due on Visit 10   ? PT Start Time [phone]   ? PT Stop Time 1010   ? PT Time Calculation (min) 45 min   ? Activity Tolerance Patient tolerated treatment well   ? Behavior During Therapy [facility] for tasks assessed/performed   ? ?  ?  ? ?  ? ? ? ? ? ?Past Medical History:  ?Diagnosis Date  ? Cerebral palsy ([place])   ? ?Past Surgical History:  ?Procedure Laterality Date  ? "rhyzotomy for CP" on spine so she could walk.  age 2 years old  ? CESAREAN SECTION    ? ?There are no problems to display for this patient. ? ? ?REFERRING PROVIDER: Jeanella Anton, NP ?  ?REFERRING DIAG: Fall risk ? ?THERAPY DIAG:  ?Muscle weakness (generalized) ? ?Abnormality of gait due to impairment of balance ? ?PERTINENT HISTORY: Cerebral palsy, Chronic pain and chronic opioid management ? ?PRECAUTIONS: Fall ? ?SUBJECTIVE: "I have noticed a difference and I am getting stronger." ? ?PAIN:  ?Are you having pain? Yes:  ?NRPS scale: 6/10 (10/10 before pain medication) ?Pain location: Low back and both hips ?Pain description: sharp and constant ?Aggravatng factors: Standing too long ?Relieving factors: Pain meds and rest ? ?PATIENT GOALS "To get as strong as I can and learn ways to care for myself." ? ? ?OBJECTIVE:  ?PATIENT SURVEYS:  ?FOTO 40, predicted 78 ? ?POSTURE:  ?Lateral weight shift to right LE ?  ?LUMBAR ROM:  ?  ?Active  A/PROM  ?03/07/2022  ?Flexion To patella  ?Extension 10   ?Right lateral flexion To femoral condyle  ?Left lateral flexion To femoral condyle  ?Right rotation    ?Left rotation    ? ?LE ROM: ?  ?Active  Right ?03/07/2022  Left ?03/07/2022  ?Hip flexion      ?Hip extension      ?Hip abduction      ?Hip adduction      ?Hip internal rotation      ?Hip external rotation      ?Knee flexion      ?Knee extension      ?Ankle dorsiflexion      ?Ankle plantarflexion      ?Ankle inversion      ?Ankle eversion      ? ?LE MMT: ?  ?MMT Right ?03/07/2022 Left ?03/07/2022 Rt / Lt ?03/12/2022 Rt / Lt ?03/26/2022  ?Hip flexion 4 4    ?Hip extension 3+ 3+    ?Hip abduction 3+ 3+ 3+ / 3+ 3+ / 3+  ?Hip adduction        ?Hip internal rotation 4 4-    ?Hip external rotation 4 4-    ?Knee flexion 4 4-    ?Knee extension 4 4 4  / 4   ?Ankle dorsiflexion 4- 4-    ?Ankle plantarflexion        ?Ankle inversion        ?Ankle eversion        ? ?FUNCTIONAL TESTS:  ?Berg Balance Scale: 38/56 ?  ?GAIT: ?Comments: maintains knee  flexion during gait, short stride length ?Assistive device utilized: None ?Level of assistance: SBA ?  ?  ?TODAY'S TREATMENT  ?[facility] Adult PT Treatment:                                                DATE: 03/28/22 ?Therapeutic Exercise: ?LTR x15 ?SLR 2x10 bil ?Bridges 2x10 ?Hooklying clamshell with green 2 x 15 ?Sidelying hip abduction 2 x 10 each ?Neuromuscular re-ed: ?Romberg on airex with feet together 60 sec x 2 ?Standing heel raises on air ex with bil UE support 2x15 ?Tandem stance on floor for 30 sec x 2 with wide BOS with CGA ?Therapeutic Activity: ?Obstacle course: stepping over sidelying cones x2 and stepping on and off airex pad with CGA and gait belt ? ?[facility] Adult PT Treatment:                                                DATE: 03/26/2022 ?Therapeutic Exercise: ?NuStep L5 x 5 min with UE/LE while taking subjective ?LTR x 10 ?SLR 2 x 10 each ?Bridge 2 x 10 ?Hooklying clamshell with green 2 x 15 ?Sidelying hip abduction 2 x 10 each ?LAQ with 3# 2 x 15 ?Sit to stand 3 x 10 hold 5# at chest ?Standing heel raises with BUE support for balance 2 x 15 ?Neuromuscular re-ed: ?Romberg stance on Airex 3 x 60 sec ? ? ?[facility] Adult PT Treatment:                                                 DATE: 03/18/2022 ?Therapeutic Exercise: ?NuStep L5 x 5 min with UE/LE while taking subjective ?Seated hamstring stretch 2 x 30 sec each ?SLR 2 x 10 each ?Bridge 2 x 10 ?Sidelying hip abduction 2 x 10 each ?LAQ with 2# 2 x 10 ?Sit to stand x 10 without UE assist, 2 x 10 hold 5# at chest ?Standing heel raises with BUE support for balance 2 x 15 ?Neuromuscular re-ed: ?Romberg stance 2 x 60 sec, on Airex x 60 sec ? ?PATIENT EDUCATION:  ?Education details: HEP. Added wide BOS in staggered stance for balance with a nearby support surface.  ?Person educated: Patient ?Education method: Explanation, Demonstration, Tactile cues, Verbal cues ?Education comprehension: Verbalized understanding ?  ?HOME EXERCISE PROGRAM: ?Access Code: [email or web address] ?  ?  ?ASSESSMENT: ?CLINICAL IMPRESSION: ?Patient arrived to PT session with reports of 6/10 pain in her lower back and bilateral hips. She tolerated all exercises without increased pain. She demonstrates difficulty performing eccentric control with hip strengthening exercises and requires frequent verbal cues to perform correctly. She required CGA during all standing balance activities due to preference to lean to the right. Patient will continue to benefit from PT to improve balance and LE strength.  ?  ?  ?OBJECTIVE IMPAIRMENTS Abnormal gait, decreased activity tolerance, decreased balance, and pain.  ?  ?ACTIVITY LIMITATIONS community activity, occupation, and shopping.  ?  ?PERSONAL FACTORS Time since onset of injury/illness/exacerbation and 1 comorbidity: cerebral palsy  are also affecting patient's functional outcome.  ?  ?  ?GOALS: ?Goals reviewed with  patient? No ?  ?SHORT TERM GOALS: Target date: 03/21/2022 ?  ?Patient will be independent with HEP for PT progression. ?Baseline: initial HEP provided ?03/26/2022: independent with initial HEP ?Goal status: MET ?  ?  ?LONG TERM GOALS: Target date: 04/11/2022 ?  ?Patient will score 58 on  FOTO. ?Baseline: 40 ?Goal status: INITIAL ?  ?2.  Markita will demonstrate >/= 4/5 LE MMT for improved strength and decrease fall risk.  ?Baseline: 3+/5 bilateral hip EXT and ABD and 4-/5 bil ankle DF ?Goal status: INITIAL ?  ?3.  Tezra will score >/= 45 on the BERG balance scale to demonstrate decreased risk of falls. ?Baseline: 38/56 ?Goal status: INITIAL ?  ?4.  Adel will report 0 falls to demonstrate improved safety when ambulating in the community.  ?Baseline: had 1 fall <1 week ago ?Goal status: INITIAL ?  ?  ?PLAN: ?PT FREQUENCY: 2x/week ?  ?PT DURATION: 5 weeks ?  ?PLANNED INTERVENTIONS: Therapeutic exercises, Therapeutic activity, Neuromuscular re-education, Balance training, Gait training, Patient/Family education, Joint mobilization, Stair training, Cryotherapy, Moist heat, and Manual therapy. ?  ?PLAN FOR NEXT SESSION: Add to initial HEP, LE strengthening, Airex. ? ? ?Edythe Lynn, PT, DPT ?03/28/22 11:10 AM ? ? ?  ? ?

## 2022-03-28 ENCOUNTER — Ambulatory Visit: Payer: 59

## 2022-03-28 DIAGNOSIS — M6281 Muscle weakness (generalized): Secondary | ICD-10-CM | POA: Diagnosis not present

## 2022-03-28 DIAGNOSIS — R2689 Other abnormalities of gait and mobility: Secondary | ICD-10-CM

## 2022-03-28 NOTE — Therapy (Signed)
?OUTPATIENT PHYSICAL THERAPY TREATMENT NOTE ? ? ?Patient Name: Gina Riley ?MRN: [id number] ?DOB:07/05/1986, 36 y.o., female ?Today's Date: 04/01/2022 ? ?PCP: Jeanella Anton, NP ?REFERRING PROVIDER: Jeanella Anton, NP ? ? PT End of Session - 04/01/22 3149   ? ? Visit Number 6   ? Number of Visits 10   ? Date for PT Re-Evaluation 04/11/22   ? Authorization Type UHC MCR w/ MCD secondary   ? Progress Note Due on Visit 10   ? PT Start Time 0930   ? PT Stop Time 1014   ? PT Time Calculation (min) 44 min   ? Activity Tolerance Patient tolerated treatment well   ? Behavior During Therapy [facility] for tasks assessed/performed   ? ?  ?  ? ?  ? ? ? ? ? ? ?Past Medical History:  ?Diagnosis Date  ? Cerebral palsy ([place])   ? ?Past Surgical History:  ?Procedure Laterality Date  ? "rhyzotomy for CP" on spine so she could walk.  age 3 years old  ? CESAREAN SECTION    ? ?There are no problems to display for this patient. ? ? ?REFERRING PROVIDER: Jeanella Anton, NP ?  ?REFERRING DIAG: Fall risk ? ?THERAPY DIAG:  ?Abnormal posture ? ?Muscle weakness (generalized) ? ?Other abnormalities of gait and mobility ? ?PERTINENT HISTORY: Cerebral palsy, Chronic pain and chronic opioid management ? ?PRECAUTIONS: Fall ? ?SUBJECTIVE: "I'm feeling really good today." ? ?PAIN:  ?Are you having pain? Yes:  ?NRPS scale: 0/10 ?Pain location: Low back and both hips ?Pain description: sharp and constant ?Aggravatng factors: Standing too long ?Relieving factors: Pain meds and rest ? ?PATIENT GOALS "To get as strong as I can and learn ways to care for myself." ? ? ?OBJECTIVE:  ?PATIENT SURVEYS:  ?FOTO 40, predicted 36 ? ?POSTURE:  ?Lateral weight shift to right LE ?  ?LUMBAR ROM:  ?  ?Active  A/PROM  ?03/07/2022  ?Flexion To patella  ?Extension 10   ?Right lateral flexion To femoral condyle  ?Left lateral flexion To femoral condyle  ?Right rotation    ?Left rotation    ? ?LE ROM: ?  ?Active  Right ?03/07/2022 Left ?03/07/2022  ?Hip flexion      ?Hip  extension      ?Hip abduction      ?Hip adduction      ?Hip internal rotation      ?Hip external rotation      ?Knee flexion      ?Knee extension      ?Ankle dorsiflexion      ?Ankle plantarflexion      ?Ankle inversion      ?Ankle eversion      ? ?LE MMT: ?  ?MMT Right ?03/07/2022 Left ?03/07/2022 Rt / Lt ?03/12/2022 Rt / Lt ?03/26/2022  ?Hip flexion 4 4    ?Hip extension 3+ 3+    ?Hip abduction 3+ 3+ 3+ / 3+ 3+ / 3+  ?Hip adduction        ?Hip internal rotation 4 4-    ?Hip external rotation 4 4-    ?Knee flexion 4 4-    ?Knee extension _0 / 4   ?Ankle dorsiflexion 4- 4-    ?Ankle plantarflexion        ?Ankle inversion        ?Ankle eversion        ? ?FUNCTIONAL TESTS:  ?Berg Balance Scale: 38/56 ?  ?GAIT: ?Comments: maintains knee flexion during gait, short stride length ?Assistive  device utilized: None ?Level of assistance: SBA ?  ?  ?TODAY'S TREATMENT  ?[facility] Adult PT Treatment:                                                DATE: 04/01/22 ?Therapeutic Exercise: ?SLR 2x12 bil  ?Bridges 2x15 with yellow TB tied around thighs ?Hooklying clamshell with green 2 x 15 ?Sidelying hip abduction 2 x 15 each ?Neuromuscular re-ed: ?Romberg on airex with feet together 60 sec x 2 ?Standing heel raises on air ex with bil UE support 2x15 ?Tandem stance on floor for 30 sec x 2 with wide BOS with SBA ?Therapeutic Activity: ?Step taps ?Obstacle course: stepping over sidelying cones x2 and stepping on and off airex pad with CGA and gait belt ? ?[facility] Adult PT Treatment:                                                DATE: 03/28/22 ?Therapeutic Exercise: ?LTR x15 ?SLR 2x10 bil ?Bridges 2x10 ?Hooklying clamshell with green 2 x 15 ?Sidelying hip abduction 2 x 10 each ?Neuromuscular re-ed: ?Romberg on airex with feet together 60 sec x 2 ?Standing heel raises on air ex with bil UE support 2x10 in parallel bars ?Tandem stance on floor for 30 sec x 2 with wide BOS with CGA ?Step stance with left foot on floor ?Therapeutic  Activity: ?Obstacle course: stepping over sidelying cones x2 and stepping on and off airex pad with CGA and gait belt ?Sit to stands 2x10 from low table with 5 LBS with green TB tied around knees ? ?[facility] Adult PT Treatment:                                                DATE: 03/26/2022 ?Therapeutic Exercise: ?NuStep L5 x 5 min with UE/LE while taking subjective ?LTR x 10 ?SLR 2 x 10 each ?Bridge 2 x 10 ?Hooklying clamshell with green 2 x 15 ?Sidelying hip abduction 2 x 10 each ?LAQ with 3# 2 x 15 ?Sit to stand 3 x 10 hold 5# at chest ?Standing heel raises with BUE support for balance 2 x 15 ?Neuromuscular re-ed: ?Romberg stance on Airex 3 x 60 sec ? ? ?PATIENT EDUCATION:  ?Education details: HEP.  ?Person educated: Patient ?Education method: Explanation, Demonstration, Tactile cues, Verbal cues ?Education comprehension: Verbalized understanding ?  ?HOME EXERCISE PROGRAM: ?Access Code: [email or web address] ?  ?  ?ASSESSMENT: ?CLINICAL IMPRESSION: ?Patient arrived to PT session with reports of 0/10 pain today. She tolerated all exercises without any increase in pain. Session focused on progression LE strengthening and balance training. She demonstrates improved balance with narrow BOS on the airex pad requiring SBA. She enjoyed the obstacle course stating it was a "good challenge". She prefers to swing her legs around short cones as opposed to stepping over to clear the objects.  ?  ?OBJECTIVE IMPAIRMENTS Abnormal gait, decreased activity tolerance, decreased balance, and pain.  ?  ?ACTIVITY LIMITATIONS community activity, occupation, and shopping.  ?  ?PERSONAL FACTORS Time since onset of injury/illness/exacerbation and 1 comorbidity: cerebral palsy  are also affecting patient's  functional outcome.  ?  ?  ?GOALS: ?Goals reviewed with patient? No ?  ?SHORT TERM GOALS: Target date: 03/21/2022 ?  ?Patient will be independent with HEP for PT progression. ?Baseline: initial HEP provided ?03/26/2022: independent with initial HEP ?Goal  status: MET ?  ?  ?LONG TERM GOALS: Target date: 04/11/2022 ?  ?Patient will score 58 on FOTO. ?Baseline: 40 ?Goal status: INITIAL ?  ?2.  Korayma will demonstrate >/= 4/5 LE MMT for improved strength and decrease fall risk.  ?Baseline: 3+/5 bilateral hip EXT and ABD and 4-/5 bil ankle DF ?Goal status: INITIAL ?  ?3.  Irys will score >/= 45 on the BERG balance scale to demonstrate decreased risk of falls. ?Baseline: 38/56 ?Goal status: INITIAL ?  ?4.  Marta will report 0 falls to demonstrate improved safety when ambulating in the community.  ?Baseline: had 1 fall <1 week ago ?Goal status: INITIAL ?  ?  ?PLAN: ?PT FREQUENCY: 2x/week ?  ?PT DURATION: 5 weeks ?  ?PLANNED INTERVENTIONS: Therapeutic exercises, Therapeutic activity, Neuromuscular re-education, Balance training, Gait training, Patient/Family education, Joint mobilization, Stair training, Cryotherapy, Moist heat, and Manual therapy. ?  ?PLAN FOR NEXT SESSION: Add to initial HEP, LE strengthening, Airex. Step stance to encourage weight shifting over one leg.  ? ? ?Edythe Lynn, PT, DPT ?04/01/22 11:12 AM ? ? ?  ? ?

## 2022-04-01 ENCOUNTER — Ambulatory Visit: Payer: 59

## 2022-04-01 DIAGNOSIS — R2689 Other abnormalities of gait and mobility: Secondary | ICD-10-CM

## 2022-04-01 DIAGNOSIS — M6281 Muscle weakness (generalized): Secondary | ICD-10-CM | POA: Diagnosis not present

## 2022-04-01 DIAGNOSIS — R293 Abnormal posture: Secondary | ICD-10-CM

## 2022-04-02 NOTE — Therapy (Addendum)
OUTPATIENT PHYSICAL THERAPY TREATMENT NOTE / Discharge   Patient Name: Gina Riley MRN: [id number] DOB:June 07, 1986, 36 y.o., female Today's Date: 04/04/2022  PCP: Jeanella Anton, NP REFERRING PROVIDER: Jeanella Anton, NP   PT End of Session - 04/04/22 [id number]     Visit Number 7    Number of Visits 10    Date for PT Re-Evaluation 04/11/22    Authorization Type [employer] MCR w/ MCD secondary    Progress Note Due on Visit 10    PT Start Time 0937    PT Stop Time 1017    PT Time Calculation (min) 40 min    Activity Tolerance Patient tolerated treatment well    Behavior During Therapy [facility] for tasks assessed/performed                  Past Medical History:  Diagnosis Date   Cerebral palsy [facility])    Past Surgical History:  Procedure Laterality Date   "rhyzotomy for CP" on spine so she could walk.  age 54 years old   CESAREAN SECTION     There are no problems to display for this patient.   REFERRING PROVIDER: Jeanella Anton, NP   REFERRING DIAG: Fall risk  THERAPY DIAG:  Abnormal posture  Muscle weakness (generalized)  Other abnormalities of gait and mobility  Abnormality of gait due to impairment of balance  PERTINENT HISTORY: Cerebral palsy, Chronic pain and chronic opioid management  PRECAUTIONS: Fall  SUBJECTIVE: "I'm feeling good and having no pain."  PAIN:  Are you having pain? No  NRPS scale: 0/10 Pain location: Low back and both hips Pain description: sharp and constant Aggravatng factors: Standing too long Relieving factors: Pain meds and rest  PATIENT GOALS "To get as strong as I can and learn ways to care for myself."   OBJECTIVE:  PATIENT SURVEYS:  FOTO 40, predicted 58  POSTURE:  Lateral weight shift to right LE   LUMBAR ROM:    Active  A/PROM  03/07/2022  Flexion To patella  Extension 10   Right lateral flexion To femoral condyle  Left lateral flexion To femoral condyle  Right rotation    Left rotation     LE ROM:    Active  Right 03/07/2022 Left 03/07/2022  Hip flexion      Hip extension      Hip abduction      Hip adduction      Hip internal rotation      Hip external rotation      Knee flexion      Knee extension      Ankle dorsiflexion      Ankle plantarflexion      Ankle inversion      Ankle eversion       LE MMT:   MMT Right 03/07/2022 Left 03/07/2022 Rt / Lt 03/12/2022 Rt / Lt 03/26/2022  Hip flexion 4 4    Hip extension 3+ 3+    Hip abduction 3+ 3+ 3+ / 3+ 3+ / 3+  Hip adduction        Hip internal rotation 4 4-    Hip external rotation 4 4-    Knee flexion 4 4-    Knee extension 4 4 4  / 4   Ankle dorsiflexion 4- 4-    Ankle plantarflexion        Ankle inversion        Ankle eversion         FUNCTIONAL TESTS:  Merrilee Jansky Balance Scale:  38/56   GAIT: Comments: maintains knee flexion during gait, short stride length Assistive device utilized: None Level of assistance: SBA     TODAY'S TREATMENT  [facility] Adult PT Treatment:                                                DATE: 04/04/22 Therapeutic Exercise: SLR 2x12 bil  Bridges 2x10 with red TB tied around thighs Hooklying clamshell with green 3 x 10 Sidelying hip abduction 3x10 each Therapeutic Activity: Sit to stands with 5 lb kettleball and green TB around knees 2x12 Step taps on airex between parallel bars x15 Obstacle course: stepping over sidelying cones x2 and stepping on and off airex pad with CGA and gait belt Romberg on airex with feet together 60 sec x 1   [facility] Adult PT Treatment:                                                DATE: 04/01/22 Therapeutic Exercise: SLR 2x12 bil  Bridges 2x15 with yellow TB tied around thighs Hooklying clamshell with green 2 x 15 Sidelying hip abduction 2 x 15 each Neuromuscular re-ed: Romberg on airex with feet together 60 sec x 2 Standing heel raises on air ex with bil UE support 2x15 Tandem stance on floor for 30 sec x 2 with wide BOS with SBA Therapeutic Activity: Step  taps Obstacle course: stepping over sidelying cones x2 and stepping on and off airex pad with CGA and gait belt  [facility] Adult PT Treatment:                                                DATE: 03/28/22 Therapeutic Exercise: LTR x15 SLR 2x10 bil Bridges 2x10 Hooklying clamshell with green 2 x 15 Sidelying hip abduction 2 x 10 each Neuromuscular re-ed: Romberg on airex with feet together 60 sec x 2 Standing heel raises on air ex with bil UE support 2x10 in parallel bars Tandem stance on floor for 30 sec x 2 with wide BOS with CGA Step stance with left foot on floor Therapeutic Activity: Obstacle course: stepping over sidelying cones x2 and stepping on and off airex pad with CGA and gait belt Sit to stands 2x10 from low table with 5 LBS with green TB tied around knees   PATIENT EDUCATION:  Education details: HEP.  Person educated: Patient Education method: Explanation, Demonstration, Tactile cues, Verbal cues Education comprehension: Verbalized understanding   HOME EXERCISE PROGRAM: Access Code: [email or web address]     ASSESSMENT: CLINICAL IMPRESSION: Patient arrived to PT session with 0/10 pain and tolerated all exercises without increased pain. She continues to demonstrate genu valgum with standing exercises and fatigues with lateral hip strengthening exercises. She demonstrates bil LE circumduction when attempting to step over small obstacles. Continue with LE strengthening and standing balance exercises.    OBJECTIVE IMPAIRMENTS Abnormal gait, decreased activity tolerance, decreased balance, and pain.    ACTIVITY LIMITATIONS community activity, occupation, and shopping.    PERSONAL FACTORS Time since onset of injury/illness/exacerbation and 1 comorbidity: cerebral palsy  are also affecting patient's functional outcome.  GOALS: Goals reviewed with patient? No   SHORT TERM GOALS: Target date: 03/21/2022   Patient will be independent with HEP for PT progression. Baseline: initial  HEP provided 03/26/2022: independent with initial HEP Goal status: MET     LONG TERM GOALS: Target date: 04/11/2022   Patient will score 58 on FOTO. Baseline: 40 Goal status: INITIAL   2.  Shirleymae will demonstrate >/= 4/5 LE MMT for improved strength and decrease fall risk.  Baseline: 3+/5 bilateral hip EXT and ABD and 4-/5 bil ankle DF Goal status: INITIAL   3.  Amir will score >/= 45 on the BERG balance scale to demonstrate decreased risk of falls. Baseline: 38/56 Goal status: INITIAL   4.  Ruthie will report 0 falls to demonstrate improved safety when ambulating in the community.  Baseline: had 1 fall <1 week ago Goal status: INITIAL     PLAN: PT FREQUENCY: 2x/week   PT DURATION: 5 weeks   PLANNED INTERVENTIONS: Therapeutic exercises, Therapeutic activity, Neuromuscular re-education, Balance training, Gait training, Patient/Family education, Joint mobilization, Stair training, Cryotherapy, Moist heat, and Manual therapy.   PLAN FOR NEXT SESSION: Add to initial HEP, LE strengthening, Airex. Step stance to encourage weight shifting over one leg. Reassess FOTO in next session.    Edythe Lynn, PT, DPT 04/04/22 10:27 AM       PHYSICAL THERAPY DISCHARGE SUMMARY  Visits from Start of Care: 7  Current functional level related to goals / functional outcomes: See goals   Remaining deficits: Current status unknown   Education / Equipment: HEP, theraband, posture   Patient agrees to discharge. Patient goals were not met. Patient is being discharged due to not returning since the last visit.   Kristoffer Leamon PT, DPT, LAT, ATC  05/20/22  1:13 PM

## 2022-04-04 ENCOUNTER — Ambulatory Visit: Payer: 59

## 2022-04-04 DIAGNOSIS — M6281 Muscle weakness (generalized): Secondary | ICD-10-CM

## 2022-04-04 DIAGNOSIS — R293 Abnormal posture: Secondary | ICD-10-CM

## 2022-04-04 DIAGNOSIS — R2689 Other abnormalities of gait and mobility: Secondary | ICD-10-CM

## 2023-02-17 ENCOUNTER — Ambulatory Visit: Payer: 59 | Admitting: Orthopedic Surgery

## 2023-02-24 ENCOUNTER — Ambulatory Visit: Payer: 59 | Admitting: Orthopedic Surgery

## 2023-04-29 ENCOUNTER — Ambulatory Visit: Payer: 59 | Admitting: Orthopedic Surgery

## 2023-05-02 ENCOUNTER — Ambulatory Visit (INDEPENDENT_AMBULATORY_CARE_PROVIDER_SITE_OTHER): Payer: 59 | Admitting: Family

## 2023-05-02 DIAGNOSIS — G8929 Other chronic pain: Secondary | ICD-10-CM

## 2023-05-02 DIAGNOSIS — M25562 Pain in left knee: Secondary | ICD-10-CM | POA: Diagnosis not present

## 2023-05-02 DIAGNOSIS — M1712 Unilateral primary osteoarthritis, left knee: Secondary | ICD-10-CM | POA: Diagnosis not present

## 2023-05-06 NOTE — Progress Notes (Signed)
Office Visit Note   Patient: Gina Riley           Date of Birth: 06/30/1986           MRN: [id number] Visit Date: 05/02/2023              Requested by: Merryl Hacker, NP [address] [place],  [place] [address] PCP: Merryl Hacker, NP  Chief Complaint  Patient presents with   Right Hip - Pain   Left Knee - Pain      HPI: The patient is a 37 year old woman seen today for a concern of left knee pain with giving way. Wonders if she has been favoring and putting too much pressure on the right causing her new right hip pain.  This is mild she is presents mainly today for a sturdy knee brace she has used some slight on knee braces in the past this did not provide her with adequate support she has had many falls due to giving way of the left knee.  Chronic deep pain known history of osteoarthritis  Has completed 6 weeks of physical therapy for strengthening and range of motion and has not noticed much improvement in her symptoms of her left knee  Assessment & Plan: Visit Diagnoses: No diagnosis found.  Plan: Provided the hinged brace today in the office patient satisfied with this she will use this for her activities of daily living follow-up on an as-needed basis.  Follow-Up Instructions: No follow-ups on file.   Left Knee Exam   Muscle Strength  The patient has normal left knee strength.  Tenderness  The patient is experiencing tenderness in the lateral joint line and medial joint line.  Tests  Varus: negative Valgus: negative  Other  Swelling: none Effusion: no effusion present      Patient is alert, oriented, no adenopathy, well-dressed, normal affect, normal respiratory effort.   Imaging: No results found. No images are attached to the encounter.  Labs: Lab Results  Component Value Date   REPTSTATUS 09/11/2019 FINAL 09/09/2019   CULT >=100,000 COLONIES/mL ESCHERICHIA COLI (A) 09/09/2019   LABORGA ESCHERICHIA COLI (A) 09/09/2019      Lab Results  Component Value Date   ALBUMIN 3.9 05/20/2016   ALBUMIN 3.8 02/20/2015   ALBUMIN 3.6 12/13/2012    No results found for: "MG" No results found for: "VD25OH"  No results found for: "PREALBUMIN"    Latest Ref Rng & Units 01/23/2022   11:27 AM 05/20/2016   12:00 PM 02/20/2015    8:23 AM  CBC EXTENDED  WBC 4.0 - 10.5 K/uL 4.7  6.0  3.9   RBC 3.87 - 5.11 MIL/uL 3.89  4.05  4.06   Hemoglobin 12.0 - 15.0 g/dL 62.1  30.8  65.7   HCT 36.0 - 46.0 % 36.5  36.2  36.7   Platelets 150 - 400 K/uL 158  169  153   NEUT# 1.7 - 7.7 K/uL   1.8   Lymph# 0.7 - 4.0 K/uL   1.6      There is no height or weight on file to calculate BMI.  Orders:  No orders of the defined types were placed in this encounter.  No orders of the defined types were placed in this encounter.    Procedures: No procedures performed  Clinical Data: No additional findings.  ROS:  All other systems negative, except as noted in the HPI. Review of Systems  Objective: Vital Signs: There were no vitals taken  for this visit.  Specialty Comments:  No specialty comments available.  PMFS History: There are no problems to display for this patient.  Past Medical History:  Diagnosis Date   Cerebral palsy (HCC)     Family History  Problem Relation Age of Onset   Vision loss Father     Past Surgical History:  Procedure Laterality Date   "rhyzotomy for CP" on spine so she could walk.  age 32 years old   CESAREAN SECTION     Social History   Occupational History   Not on file  Tobacco Use   Smoking status: Every Day    Packs/day: .2    Types: Cigarettes   Smokeless tobacco: Never  Substance and Sexual Activity   Alcohol use: No   Drug use: Yes    Types: Marijuana   Sexual activity: Yes    Partners: Male    Birth control/protection: Condom    Comment: last month

## 2023-07-13 ENCOUNTER — Other Ambulatory Visit: Payer: Self-pay

## 2023-07-13 ENCOUNTER — Emergency Department (HOSPITAL_BASED_OUTPATIENT_CLINIC_OR_DEPARTMENT_OTHER): Payer: 59 | Admitting: Radiology

## 2023-07-13 ENCOUNTER — Encounter (HOSPITAL_BASED_OUTPATIENT_CLINIC_OR_DEPARTMENT_OTHER): Payer: Self-pay | Admitting: Emergency Medicine

## 2023-07-13 DIAGNOSIS — M25551 Pain in right hip: Secondary | ICD-10-CM | POA: Insufficient documentation

## 2023-07-13 DIAGNOSIS — W19XXXA Unspecified fall, initial encounter: Secondary | ICD-10-CM | POA: Insufficient documentation

## 2023-07-13 NOTE — ED Triage Notes (Signed)
Pt via pov from home with right hip pain that started this morning. She reports that the pain goes down her leg. Pt has cerebral palsy and falls a lot, but it has been a week since she last fell. Pt took hydrocodone at 1530 hours with no relief. Pt alert  & oriented, nad noted.

## 2023-07-14 ENCOUNTER — Emergency Department (HOSPITAL_BASED_OUTPATIENT_CLINIC_OR_DEPARTMENT_OTHER): Payer: 59

## 2023-07-14 ENCOUNTER — Emergency Department (HOSPITAL_BASED_OUTPATIENT_CLINIC_OR_DEPARTMENT_OTHER)
Admission: EM | Admit: 2023-07-14 | Discharge: 2023-07-14 | Disposition: A | Discharge status: Home / Self Care | Payer: 59 | Attending: Emergency Medicine | Admitting: Emergency Medicine

## 2023-07-14 DIAGNOSIS — M25551 Pain in right hip: Secondary | ICD-10-CM | POA: Diagnosis not present

## 2023-07-14 MED ORDER — KETOROLAC TROMETHAMINE 30 MG/ML IJ SOLN
15.0000 mg | Freq: Once | INTRAMUSCULAR | Status: AC
  Administered 2023-07-14: 15 mg via INTRAMUSCULAR
  Filled 2023-07-14: qty 1

## 2023-07-14 MED ORDER — FENTANYL CITRATE PF 50 MCG/ML IJ SOSY
50.0000 ug | PREFILLED_SYRINGE | Freq: Once | INTRAMUSCULAR | Status: AC
  Administered 2023-07-14: 50 ug via INTRAMUSCULAR
  Filled 2023-07-14: qty 1

## 2023-07-14 NOTE — ED Notes (Signed)
Patient resting quietly in stretcher, respirations even, unlabored, no acute distress noted. Denies needs at this time. Awaiting XR. Pt's mother at bedside.

## 2023-07-14 NOTE — ED Provider Notes (Signed)
[place] EMERGENCY DEPARTMENT AT [facility]  Provider Note  CSN: [id number] Arrival date & time: 07/13/23 2035  History Chief Complaint  Patient presents with   Hip Pain    Gina Riley is a 37 y.o. female with history of cerebral palsy normally walks with a leg brace and a walker reports she had two falls recently, does not think she injured her hip but has had increased pain in R hip since today, radiating down her R leg. Took norco at home with minimal improvement. No other reported injuries.    Home Medications Prior to Admission medications   Medication Sig Start Date End Date Taking? Authorizing Provider  acetaminophen (TYLENOL) 325 MG tablet Take 650 mg by mouth every 6 (six) hours as needed for mild pain or headache. Patient not taking: Reported on 03/07/2022    [provider]  baclofen (LIORESAL) 10 MG tablet Take 1 tablet (10 mg total) by mouth 3 (three) times daily. 04/17/15   Hayden Rasmussen, NP  diclofenac (VOLTAREN) 50 MG EC tablet Take 50 mg by mouth every 8 (eight) hours as needed for muscle pain. 08/05/19   [provider]  furosemide (LASIX) 20 MG tablet Take 20 mg by mouth daily.     [provider]  HYDROcodone-acetaminophen (NORCO/VICODIN) 5-325 MG tablet Take 1 tablet by mouth 3 (three) times daily as needed for moderate pain.  07/06/18   [provider]  meloxicam (MOBIC) 7.5 MG tablet Take 1 tablet (7.5 mg total) by mouth daily as needed for pain. Patient not taking: Reported on 03/07/2022 09/09/19   Raeford Razor, MD  orphenadrine (NORFLEX) 100 MG tablet Take 1 tablet (100 mg total) by mouth 2 (two) times daily. Patient not taking: Reported on 03/07/2022 05/28/17   Jaynie Crumble, PA-C  Vitamin D, Ergocalciferol, (DRISDOL) 1.25 MG (50000 UT) CAPS capsule Take 50,000 Units by mouth once a week. 08/01/19   [provider]     Allergies    Codeine, Codeine, Penicillins, and Penicillins   Review of  Systems   Review of Systems Please see HPI for pertinent positives and negatives  Physical Exam BP 139/85   Pulse 71   Temp 99.6 F (37.6 C) (Oral)   Resp 16   Ht 5\' 3"  (1.6 m)   Wt 51.7 kg   LMP 07/06/2023 (Approximate)   SpO2 99%   BMI 20.19 kg/m   Physical Exam Vitals and nursing note reviewed.  HENT:     Head: Normocephalic.     Nose: Nose normal.  Eyes:     Extraocular Movements: Extraocular movements intact.  Pulmonary:     Effort: Pulmonary effort is normal.  Musculoskeletal:        General: Tenderness (R anterolateral hip) present. No swelling or deformity. Normal range of motion.     Cervical back: Neck supple.  Skin:    Findings: No rash (on exposed skin).  Neurological:     Mental Status: She is alert and oriented to person, place, and time.  Psychiatric:        Mood and Affect: Mood normal.     ED Results / Procedures / Treatments   EKG None  Procedures Procedures  Medications Ordered in the ED Medications  ketorolac (TORADOL) 30 MG/ML injection 15 mg (has no administration in time range)  fentaNYL (SUBLIMAZE) injection 50 mcg (has no administration in time range)    Initial Impression and Plan  Patient with cerebral palsy here with R hip pain. Has  had recent falls, but no definite hip injury then. Overall exam is reassuring. Will check xrays. Pain medications for comfort.   ED Course   Clinical Course as of 07/14/23 0050  Mon Jul 14, 2023  4098 I personally viewed the images from radiology studies and agree with radiologist interpretation:  Xray of the hip is neg for fracture. Patient advised to rest, ice, continue with her usual pain medications and follow up with PCP and/or Ortho if not improving. RTED for any other concerns.  [CS]    Clinical Course User Index [CS] Pollyann Savoy, MD     MDM Rules/Calculators/A&P Medical Decision Making Problems Addressed: Right hip pain: acute illness or injury  Amount and/or Complexity of  Data Reviewed Radiology: ordered and independent interpretation performed. Decision-making details documented in ED Course.  Risk Prescription drug management. Parenteral controlled substances.     Final Clinical Impression(s) / ED Diagnoses Final diagnoses:  Right hip pain    Rx / DC Orders ED Discharge Orders     None        Pollyann Savoy, MD 07/14/23 [id number]

## 2023-07-14 NOTE — ED Notes (Signed)
Reviewed AVS with patient, patient expressed understanding of directions, denies further questions at this time. 

## 2023-07-24 ENCOUNTER — Other Ambulatory Visit (INDEPENDENT_AMBULATORY_CARE_PROVIDER_SITE_OTHER): Payer: 59

## 2023-07-24 ENCOUNTER — Encounter: Payer: Self-pay | Admitting: Orthopedic Surgery

## 2023-07-24 ENCOUNTER — Ambulatory Visit (INDEPENDENT_AMBULATORY_CARE_PROVIDER_SITE_OTHER): Payer: 59 | Admitting: Orthopedic Surgery

## 2023-07-24 DIAGNOSIS — M79604 Pain in right leg: Secondary | ICD-10-CM

## 2023-07-24 DIAGNOSIS — M5441 Lumbago with sciatica, right side: Secondary | ICD-10-CM

## 2023-07-24 DIAGNOSIS — M5442 Lumbago with sciatica, left side: Secondary | ICD-10-CM

## 2023-07-24 NOTE — Progress Notes (Signed)
Office Visit Note   Patient: Gina Riley           Date of Birth: [birth year]           MRN: [id number] Visit Date: 07/24/2023              Requested by: Merryl Hacker, NP [address] [place],  [place] [address] PCP: Merryl Hacker, NP  Chief Complaint  Patient presents with   Right Hip - Pain      HPI: Patient is a 37 year old woman who states she has multiple falls due to the deformity of her left knee.  Patient states that she has had pain radiating from her back to the right hip and now has buttock pain radiating from her lower back.  She denies any numbness or tingling denies any acute weakness.  Patient states she went to the emergency room a week ago with radiographs of the right hip obtained with recent fall.  Assessment & Plan: Visit Diagnoses:  1. Pain in right leg   2. Acute midline low back pain with bilateral sciatica     Plan: Recommended nonsteroidals plus heat.  Discussed that if she has increasing symptoms we could reevaluate her at that time.  Follow-Up Instructions: Return if symptoms worsen or fail to improve.   Ortho Exam  Patient is alert, oriented, no adenopathy, well-dressed, normal affect, normal respiratory effort. Examination patient has negative straight leg raise bilaterally she has good range of motion of both lower extremities with no focal motor weakness.  With ambulation she has a crouched gait with valgus alignment to the left knee.  Imaging: XR Lumbar Spine 2-3 Views  Result Date: 07/24/2023 2 view radiographs of the lumbar spine shows increased lordosis with good disc space and no compression fractures.  No pars defect.  Radiographs of her hip shows a congruent joint without fracture.  No images are attached to the encounter.  Labs: Lab Results  Component Value Date   REPTSTATUS 09/11/2019 FINAL 09/09/2019   CULT >=100,000 COLONIES/mL ESCHERICHIA COLI (A) 09/09/2019   LABORGA ESCHERICHIA COLI (A) 09/09/2019      Lab Results  Component Value Date   ALBUMIN 3.9 05/20/2016   ALBUMIN 3.8 02/20/2015   ALBUMIN 3.6 12/13/2012    No results found for: "MG" No results found for: "VD25OH"  No results found for: "PREALBUMIN"    Latest Ref Rng & Units 01/23/2022   11:27 AM 05/20/2016   12:00 PM 02/20/2015    8:23 AM  CBC EXTENDED  WBC 4.0 - 10.5 K/uL 4.7  6.0  3.9   RBC 3.87 - 5.11 MIL/uL 3.89  4.05  4.06   Hemoglobin 12.0 - 15.0 g/dL 84.6  96.2  95.2   HCT 36.0 - 46.0 % 36.5  36.2  36.7   Platelets 150 - 400 K/uL 158  169  153   NEUT# 1.7 - 7.7 K/uL   1.8   Lymph# 0.7 - 4.0 K/uL   1.6      There is no height or weight on file to calculate BMI.  Orders:  Orders Placed This Encounter  Procedures   XR Lumbar Spine 2-3 Views   No orders of the defined types were placed in this encounter.    Procedures: No procedures performed  Clinical Data: No additional findings.  ROS:  All other systems negative, except as noted in the HPI. Review of Systems  Objective: Vital Signs: LMP 07/06/2023 (Approximate)   Specialty Comments:  No specialty comments  available.  PMFS History: There are no problems to display for this patient.  Past Medical History:  Diagnosis Date   Cerebral palsy (HCC)     Family History  Problem Relation Age of Onset   Vision loss Father     Past Surgical History:  Procedure Laterality Date   "rhyzotomy for CP" on spine so she could walk.  age 58 years old   CESAREAN SECTION     Social History   Occupational History   Not on file  Tobacco Use   Smoking status: Every Day    Types: Cigarettes    Start date: 12/16/1996   Smokeless tobacco: Never  Vaping Use   Vaping status: Never Used  Substance and Sexual Activity   Alcohol use: No    Comment: occasionally   Drug use: Yes    Frequency: 4.0 times per week    Types: Marijuana    Comment: pain control   Sexual activity: Yes    Partners: Male    Birth control/protection: Condom    Comment: last  month

## 2023-10-30 ENCOUNTER — Encounter: Payer: Self-pay | Admitting: Obstetrics and Gynecology

## 2023-10-30 ENCOUNTER — Other Ambulatory Visit (HOSPITAL_COMMUNITY)
Admission: RE | Admit: 2023-10-30 | Discharge: 2023-10-30 | Disposition: A | Discharge status: Home / Self Care | Payer: 59 | Source: Ambulatory Visit | Attending: Obstetrics and Gynecology | Admitting: Obstetrics and Gynecology

## 2023-10-30 ENCOUNTER — Ambulatory Visit (INDEPENDENT_AMBULATORY_CARE_PROVIDER_SITE_OTHER): Payer: 59 | Admitting: Obstetrics and Gynecology

## 2023-10-30 VITALS — BP 130/86 | HR 56 | Ht 63.0 in | Wt 120.4 lb

## 2023-10-30 DIAGNOSIS — Z3169 Encounter for other general counseling and advice on procreation: Secondary | ICD-10-CM

## 2023-10-30 DIAGNOSIS — Z1151 Encounter for screening for human papillomavirus (HPV): Secondary | ICD-10-CM | POA: Diagnosis not present

## 2023-10-30 DIAGNOSIS — D069 Carcinoma in situ of cervix, unspecified: Secondary | ICD-10-CM | POA: Insufficient documentation

## 2023-10-30 DIAGNOSIS — Z01411 Encounter for gynecological examination (general) (routine) with abnormal findings: Secondary | ICD-10-CM | POA: Insufficient documentation

## 2023-10-30 DIAGNOSIS — R87619 Unspecified abnormal cytological findings in specimens from cervix uteri: Secondary | ICD-10-CM | POA: Insufficient documentation

## 2023-10-30 DIAGNOSIS — Z113 Encounter for screening for infections with a predominantly sexual mode of transmission: Secondary | ICD-10-CM

## 2023-10-30 NOTE — Addendum Note (Signed)
Addended by: Milas Hock A on: 10/30/2023 10:12 AM   Modules accepted: Orders

## 2023-10-30 NOTE — Progress Notes (Signed)
    GYNECOLOGY OFFICE COLPOSCOPY PROCEDURE NOTE  37 y.o. 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   Pregnancy test:  negative  Informed consent and review of risks, benefit and alternatives performed. Written consent given.   Speculum inserted into patient's vagina assuring full view of cervix and vaginal walls. 3 swabs of vinegar solution applied to the cervix and vaginal walls and colposcope was used to observe both the cervix and vaginal walls.   Colposcopy adequate? Yes  No visible lesions, no mosaicism, no punctation, and no abnormal vasculature; ECC collected. Cervix normal on bimanual exam.   All specimens were labeled and sent to pathology.  Monsel's applied to biopsy sites for good hemostasis and speculum removed.  Pt tolerated well with minimal pain and bleeding.   Patient was given post procedure instructions.  Will follow up pathology and manage accordingly; patient will be contacted with results and recommendations.  Routine preventative health maintenance measures emphasized.  Pt requested STI testing. She also asked about ovarian reserve. AMH checked.   Milas Hock, MD, FACOG Obstetrician & Gynecologist, [facility] for [facility], [facility]

## 2023-11-03 ENCOUNTER — Telehealth: Payer: Self-pay | Admitting: *Deleted

## 2023-11-03 LAB — SURGICAL PATHOLOGY

## 2023-11-03 NOTE — Telephone Encounter (Signed)
I called patient and heard message voicemail is full and cannot accept messages. Will leave in inbox so we can call her to go over results with her , tell her Dr. Para March recommends LEEP and then send to front office to schedule. Nancy Fetter

## 2023-11-03 NOTE — Telephone Encounter (Signed)
-----   Message from Milas Hock sent at 11/03/2023  2:33 PM EST ----- Pt needs an appointment for a LEEP procedure. Please schedule.  Thanks, pad

## 2023-11-04 LAB — CYTOLOGY - PAP
Adequacy: ABSENT
Comment: NEGATIVE
Comment: NEGATIVE
Comment: NEGATIVE
Diagnosis: UNDETERMINED — AB
HPV 16: POSITIVE — AB
HPV 18 / 45: NEGATIVE
High risk HPV: POSITIVE — AB

## 2023-11-05 NOTE — Telephone Encounter (Signed)
Called pt and heard that voicemail belongs to Gina Riley (pt's significant other). I was unable to leave message due to mailbox is full.

## 2023-11-08 LAB — HEPATITIS C ANTIBODY: Hep C Virus Ab: NONREACTIVE

## 2023-11-08 LAB — HIV ANTIBODY (ROUTINE TESTING W REFLEX): HIV Screen 4th Generation wRfx: NONREACTIVE

## 2023-11-08 LAB — RPR: RPR Ser Ql: NONREACTIVE

## 2023-11-08 LAB — ANTI MULLERIAN HORMONE: ANTI-MULLERIAN HORMONE (AMH): 0.707 ng/mL

## 2023-11-10 ENCOUNTER — Encounter: Payer: Self-pay | Admitting: *Deleted

## 2023-11-10 NOTE — Telephone Encounter (Signed)
Per chart review provider sent Gina Riley a message re: results  and it has not been read. I called Gina Riley's home/ mobile number on file and again heard a message voicemail is full. I called number for her significant other Gina Riley and heard message" call cannot be completed at this time". I called her contact for her Mother Gina Riley and also head a message mail box is full. I  will send a letter to address on file.   Nancy Fetter

## 2023-12-24 ENCOUNTER — Other Ambulatory Visit: Payer: Self-pay

## 2023-12-24 ENCOUNTER — Encounter: Payer: Self-pay | Admitting: Obstetrics and Gynecology

## 2023-12-24 ENCOUNTER — Ambulatory Visit: Payer: 59 | Admitting: Obstetrics and Gynecology

## 2023-12-24 ENCOUNTER — Ambulatory Visit: Payer: 59 | Admitting: Obstetrics & Gynecology

## 2023-12-24 ENCOUNTER — Other Ambulatory Visit (HOSPITAL_COMMUNITY)
Admission: RE | Admit: 2023-12-24 | Discharge: 2023-12-24 | Disposition: A | Discharge status: Home / Self Care | Payer: 59 | Source: Ambulatory Visit | Attending: Obstetrics and Gynecology | Admitting: Obstetrics and Gynecology

## 2023-12-24 ENCOUNTER — Telehealth: Payer: Self-pay

## 2023-12-24 VITALS — BP 119/64 | HR 56 | Wt 121.8 lb

## 2023-12-24 DIAGNOSIS — Z3202 Encounter for pregnancy test, result negative: Secondary | ICD-10-CM

## 2023-12-24 DIAGNOSIS — R87619 Unspecified abnormal cytological findings in specimens from cervix uteri: Secondary | ICD-10-CM | POA: Diagnosis present

## 2023-12-24 HISTORY — PX: LEEP: SHX91

## 2023-12-24 LAB — POCT PREGNANCY, URINE: Preg Test, Ur: NEGATIVE

## 2023-12-24 NOTE — Progress Notes (Signed)
   GYNECOLOGY OFFICE PROCEDURE NOTE  Gina Riley is a 38 y.o. G2P1011 here for LEEP. No GYN concerns. Pap smear and colposcopy history reviewed.    Pap History: 12/2022: Normal pap, HPV pos 16 10/2023: ASCUS/HPV pos, 16 and other HR HPV  Colpo Biopsy - N/A ECC CIN3  Risks, benefits, alternatives, and limitations of procedure explained to patient, including pain, bleeding, infection, failure to remove abnormal tissue and failure to cure dysplasia, need for repeat procedures, damage to pelvic organs, cervical incompetence.  Role of HPV,cervical dysplasia and need for close followup was empasized. Informed written consent was obtained. All questions were answered. Time out performed. Urine pregnancy test was Negative.  ??Procedure: The patient was placed in lithotomy position and the bivalved coated speculum was placed in the patient's vagina. A grounding pad placed on the patient. Acetic acid was applied to the cervix and the colposcopy was repeated.   Local anesthesia was administered via an intracervical block using 10 ml of 2% Lidocaine with epinephrine. The suction was turned on and the Extended Medium 1X Fisher Cone Biopsy Excisor on 55 Watts of blended current was used to excise the entire transformation zone. ECC done after.  Excellent hemostasis was achieved using roller ball coagulation set at 50 Watts coagulation current. Monsel's solution was then applied and the speculum was removed from the vagina. Specimens were sent to pathology.  ?The patient tolerated the procedure well. Post-operative instructions given to patient, including instruction to seek medical attention for persistent bright red bleeding, fever, abdominal/pelvic pain, dysuria, nausea or vomiting. She was also told about the possibility of having copious yellow to black tinged discharge for weeks. She was counseled to avoid anything in the vagina (sex/douching/tampons) for 3 weeks. She has a 4 week post-operative check  to assess wound healing, review results and discuss further management.   Vina Solian, MD, FACOG Obstetrician & Gynecologist, [facility] for [facility], [facility]

## 2023-12-24 NOTE — Telephone Encounter (Signed)
 Pt did not keep LEEP appt today. Called patient and patient contact Cedric, both numbers not valid. Called patient's mother and VM message heard. Pt presented to office shortly after this. Will either wait for appointment or reschedule.

## 2023-12-30 LAB — SURGICAL PATHOLOGY

## 2024-01-05 ENCOUNTER — Telehealth: Payer: Self-pay | Admitting: Family Medicine

## 2024-01-05 NOTE — Telephone Encounter (Signed)
Patient is calling in regarding her Leep Procedure results. She states he can not see them on mychart.

## 2024-01-13 NOTE — Telephone Encounter (Signed)
Left message that I am returning her call about results. Referred pt to provider's message via MyChart or give [place] a call if she continues to have questions or concerns.   Leonette Nutting  01/13/24

## 2024-07-02 ENCOUNTER — Encounter: Payer: Self-pay | Admitting: Advanced Practice Midwife

## 2024-08-10 ENCOUNTER — Encounter (HOSPITAL_COMMUNITY): Payer: Self-pay

## 2024-08-10 ENCOUNTER — Ambulatory Visit (INDEPENDENT_AMBULATORY_CARE_PROVIDER_SITE_OTHER)

## 2024-08-10 ENCOUNTER — Ambulatory Visit (HOSPITAL_COMMUNITY): Payer: Self-pay | Admitting: Internal Medicine

## 2024-08-10 ENCOUNTER — Ambulatory Visit (HOSPITAL_COMMUNITY)
Admission: EM | Admit: 2024-08-10 | Discharge: 2024-08-10 | Disposition: A | Discharge status: Home / Self Care | Attending: Internal Medicine | Admitting: Internal Medicine

## 2024-08-10 DIAGNOSIS — F17210 Nicotine dependence, cigarettes, uncomplicated: Secondary | ICD-10-CM | POA: Diagnosis not present

## 2024-08-10 DIAGNOSIS — R0602 Shortness of breath: Secondary | ICD-10-CM | POA: Diagnosis not present

## 2024-08-10 DIAGNOSIS — R051 Acute cough: Secondary | ICD-10-CM

## 2024-08-10 DIAGNOSIS — J209 Acute bronchitis, unspecified: Secondary | ICD-10-CM

## 2024-08-10 LAB — POC SARS CORONAVIRUS 2 AG -  ED: SARS Coronavirus 2 Ag: NEGATIVE

## 2024-08-10 MED ORDER — METHYLPREDNISOLONE SODIUM SUCC 125 MG IJ SOLR
80.0000 mg | Freq: Once | INTRAMUSCULAR | Status: AC
  Administered 2024-08-10: 80 mg via INTRAMUSCULAR

## 2024-08-10 MED ORDER — ALBUTEROL SULFATE HFA 108 (90 BASE) MCG/ACT IN AERS
2.0000 | INHALATION_SPRAY | Freq: Four times a day (QID) | RESPIRATORY_TRACT | 0 refills | Status: AC | PRN
Start: 2024-08-10 — End: ?

## 2024-08-10 MED ORDER — PREDNISONE 20 MG PO TABS
40.0000 mg | ORAL_TABLET | Freq: Every day | ORAL | 0 refills | Status: AC
Start: 2024-08-10 — End: 2024-08-15

## 2024-08-10 MED ORDER — ONDANSETRON 4 MG PO TBDP: 4.0000 mg | ORAL_TABLET | Freq: Three times a day (TID) | ORAL | 0 refills | Status: DC | PRN

## 2024-08-10 MED ORDER — IPRATROPIUM-ALBUTEROL 0.5-2.5 (3) MG/3ML IN SOLN
RESPIRATORY_TRACT | Status: AC
  Filled 2024-08-10: qty 3

## 2024-08-10 MED ORDER — IPRATROPIUM-ALBUTEROL 0.5-2.5 (3) MG/3ML IN SOLN
3.0000 mL | Freq: Once | RESPIRATORY_TRACT | Status: AC
  Administered 2024-08-10: 3 mL via RESPIRATORY_TRACT

## 2024-08-10 MED ORDER — METHYLPREDNISOLONE SODIUM SUCC 125 MG IJ SOLR
INTRAMUSCULAR | Status: AC
  Filled 2024-08-10: qty 2

## 2024-08-10 MED ORDER — PROMETHAZINE-DM 6.25-15 MG/5ML PO SYRP: 5.0000 mL | ORAL_SOLUTION | Freq: Every evening | ORAL | 0 refills | Status: DC | PRN

## 2024-08-10 MED ORDER — ONDANSETRON 4 MG PO TBDP
ORAL_TABLET | ORAL | Status: AC
  Filled 2024-08-10: qty 1

## 2024-08-10 MED ORDER — ONDANSETRON 4 MG PO TBDP
4.0000 mg | ORAL_TABLET | Freq: Once | ORAL | Status: AC
  Administered 2024-08-10: 4 mg via ORAL

## 2024-08-10 NOTE — ED Provider Notes (Signed)
 [facility]    CSN: [id number] Arrival date & time: 08/10/24  1544      History   Chief Complaint Chief Complaint  Patient presents with   Cough   Chills    HPI Gina Riley is a 38 y.o. female.   Gina Riley is a 38 y.o. female with past medical history of cerebral palsy presenting for chief complaint of cough, shortness of breath, fever, chills, nausea without vomiting, generalized fatigue, and nasal congestion that started 1 week ago.  Cough and congestion have worsened over the last 2 or 3 days and she has become short of breath when coughing.  Current everyday cigarette smoker, denies other drug use.  Denies history of asthma and COPD.  Reports history of frequent bronchitis. She is nauseous without vomiting.  Denies diarrhea, dizziness, chest pain, leg swelling, orthopnea, and rashes.  She is using albuterol  inhaler with minimal relief.   Cough   Past Medical History:  Diagnosis Date   Cerebral palsy (HCC)     There are no active problems to display for this patient.   Past Surgical History:  Procedure Laterality Date   rhyzotomy for CP on spine so she could walk.  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 Authorizing Provider  albuterol  (VENTOLIN  HFA) 108 (90 Base) MCG/ACT inhaler Inhale 2 puffs into the lungs every 6 (six) hours as needed for wheezing or shortness of breath. 08/10/24  Yes Enedelia Dorna HERO, FNP  ondansetron  (ZOFRAN -ODT) 4 MG disintegrating tablet Take 1 tablet (4 mg total) by mouth every 8 (eight) hours as needed for nausea or vomiting. 08/10/24  Yes Enedelia Dorna HERO, FNP  predniSONE  (DELTASONE ) 20 MG  tablet Take 2 tablets (40 mg total) by mouth daily with breakfast for 5 days. 08/10/24 08/15/24 Yes StanhopeDorna HERO, FNP  promethazine -dextromethorphan (PROMETHAZINE -DM) 6.25-15 MG/5ML syrup Take 5 mLs by mouth at bedtime as needed for cough. 08/10/24  Yes Enedelia Dorna HERO, FNP  baclofen  (LIORESAL ) 10 MG tablet Take 1 tablet (10 mg total) by mouth 3 (three) times daily. 04/17/15   Tharon Lenis, NP  DICLOFENAC  PO Take by mouth.    [provider]  furosemide (LASIX) 20 MG tablet Take 20 mg by mouth daily.  Patient not taking: Reported on 12/24/2023    [provider]  HYDROcodone -acetaminophen  (NORCO/VICODIN) 5-325 MG tablet Take 1 tablet by mouth 3 (three) times daily as needed for moderate pain.  07/06/18   [provider]  Vitamin D, Ergocalciferol, (DRISDOL) 1.25 MG (50000 UT) CAPS capsule Take 50,000 Units by mouth once a week. 08/01/19   [provider]    Family History Family History  Problem Relation Age of Onset   Vision loss Father     Social History Social History   Tobacco Use   Smoking status: Every Day    Types: Cigarettes  Start date: 12/16/1996   Smokeless tobacco: Never  Vaping Use   Vaping status: Never Used  Substance Use Topics   Alcohol use: No    Comment: occasionally   Drug use: Yes    Frequency: 4.0 times per week    Types: Marijuana    Comment: pain control     Allergies   Codeine, Codeine, Penicillins, and Penicillins   Review of Systems Review of Systems  Respiratory:  Positive for cough.   Per HPI   Physical Exam Triage Vital Signs ED Triage Vitals  Encounter Vitals Group     BP 08/10/24 1646 128/80     Girls Systolic BP Percentile --      Girls Diastolic BP Percentile --      Boys Systolic BP Percentile --      Boys Diastolic BP Percentile --      Pulse Rate 08/10/24 1646 86     Resp 08/10/24 1646 16     Temp 08/10/24 1646 99.2 F (37.3 C)     Temp Source 08/10/24 1646 Oral     SpO2 08/10/24  1646 99 %     Weight --      Height --      Head Circumference --      Peak Flow --      Pain Score 08/10/24 1644 4     Pain Loc --      Pain Education --      Exclude from Growth Chart --    No data found.  Updated Vital Signs BP 128/80 (BP Location: Left Arm)   Pulse 86   Temp 99.2 F (37.3 C) (Oral)   Resp 16   LMP 08/05/2024 (Exact Date)   SpO2 99%   Visual Acuity Right Eye Distance:   Left Eye Distance:   Bilateral Distance:    Right Eye Near:   Left Eye Near:    Bilateral Near:     Physical Exam Vitals and nursing note reviewed.  Constitutional:      Appearance: She is ill-appearing. She is not toxic-appearing.  HENT:     Head: Normocephalic and atraumatic.     Right Ear: Hearing, tympanic membrane, ear canal and external ear normal.     Left Ear: Hearing, tympanic membrane, ear canal and external ear normal.     Nose: Congestion present.     Mouth/Throat:     Lips: Pink.     Mouth: Mucous membranes are moist. No injury or oral lesions.     Dentition: Normal dentition.     Tongue: No lesions.     Pharynx: Oropharynx is clear. Uvula midline. No pharyngeal swelling, oropharyngeal exudate, posterior oropharyngeal erythema, uvula swelling or postnasal drip.     Tonsils: No tonsillar exudate.  Eyes:     General: Lids are normal. Vision grossly intact. Gaze aligned appropriately.     Extraocular Movements: Extraocular movements intact.     Conjunctiva/sclera: Conjunctivae normal.  Neck:     Trachea: Trachea and phonation normal.  Cardiovascular:     Rate and Rhythm: Normal rate and regular rhythm.     Heart sounds: Normal heart sounds, S1 normal and S2 normal.  Pulmonary:     Effort: Pulmonary effort is normal. No respiratory distress.     Breath sounds: Normal air entry. Wheezing (Inspiratory and expiratory wheezing heard all lung fields bilaterally with diminished breath sounds.) present. No rhonchi or rales.     Comments: Speaking in full sentences without  difficulty.  Harsh and productive sounding cough on exam with deep inspiration.  Chest:     Chest wall: No tenderness.  Musculoskeletal:     Cervical back: Neck supple.  Lymphadenopathy:     Cervical: No cervical adenopathy.  Skin:    General: Skin is warm and dry.     Capillary Refill: Capillary refill takes less than 2 seconds.     Findings: No rash.  Neurological:     General: No focal deficit present.     Mental Status: She is alert and oriented to person, place, and time. Mental status is at baseline.     Cranial Nerves: No dysarthria or facial asymmetry.  Psychiatric:        Mood and Affect: Mood normal.        Speech: Speech normal.        Behavior: Behavior normal.        Thought Content: Thought content normal.        Judgment: Judgment normal.      UC Treatments / Results  Labs (all labs ordered are listed, but only abnormal results are displayed) Labs Reviewed  POC SARS CORONAVIRUS 2 AG -  ED    EKG   Radiology No results found.  Procedures Procedures (including critical care time)  Medications Ordered in UC Medications  methylPREDNISolone  sodium succinate (SOLU-MEDROL ) 125 mg/2 mL injection 80 mg (80 mg Intramuscular Given 08/10/24 1747)  ipratropium-albuterol  (DUONEB) 0.5-2.5 (3) MG/3ML nebulizer solution 3 mL (3 mLs Nebulization Given 08/10/24 1747)  ondansetron  (ZOFRAN -ODT) disintegrating tablet 4 mg (4 mg Oral Given 08/10/24 1747)    Initial Impression / Assessment and Plan / UC Course  I have reviewed the triage vital signs and the nursing notes.  Pertinent labs & imaging results that were available during my care of the patient were reviewed by me and considered in my medical decision making (see chart for details).   1.  Acute bronchitis, acute cough, shortness of breath, cigarette nicotine dependence without complication Evaluation suggests viral bronchitis, though given patient's history and risk for pneumonia, chest x-ray ordered. Chest x-ray  negative for acute cardiopulmonary abnormality.    DuoNeb breathing treatment and Solu-Medrol  80 mg IM given in clinic with significant improvement in lung sounds and shortness of breath on reassessment.   Zofran  helped with nausea while in clinic.  Recommend treatment with steroid, bronchodilator, cough suppressants for symptomatic relief, and expectorants (mucinex) as needed- see AVS.   Counseled patient on potential for adverse effects with medications prescribed/recommended today, strict ER and return-to-clinic precautions discussed, patient verbalized understanding.    Final Clinical Impressions(s) / UC Diagnoses   Final diagnoses:  Acute cough  Acute bronchitis, unspecified organism  Shortness of breath  Cigarette nicotine dependence without complication     Discharge Instructions      You have bronchitis which is inflammation of the upper airways in your lungs due to a virus.   Your chest x-ray appears normal and does not show any signs of pneumonia by my interpretation, staff will call if the radiology reread shows abnormality requiring change in treatment plan.  We gave you a shot of steroid in the clinic today. Start taking prednisone  pills tomorrow as prescribed (2 pills once daily for 5 days with breakfast).  Use albuterol  2 puffs every 4-6 hours as needed for cough, shortness of breath, and wheezing.   Use guaifenesin (plain mucinex) to break up congestion in nose/chest so that you are able to excrete easier. Drink plenty of fluids  to stay well hydrated while taking mucinex so that it works well in the body.   You may take Zofran  every 8 hours as needed for nausea and vomiting.  Promethazine  DM cough syrup at bedtime as needed.  Do not take this during the day as this can make you sleepy.  If you develop any new or worsening symptoms or if your symptoms do not start to improve, please return here or follow-up with your primary care provider. If your symptoms are  severe, please go to the emergency room.    ED Prescriptions     Medication Sig Dispense Auth. Provider   promethazine -dextromethorphan (PROMETHAZINE -DM) 6.25-15 MG/5ML syrup Take 5 mLs by mouth at bedtime as needed for cough. 118 mL Enedelia Going M, FNP   predniSONE  (DELTASONE ) 20 MG tablet Take 2 tablets (40 mg total) by mouth daily with breakfast for 5 days. 10 tablet Enedelia Going HERO, FNP   ondansetron  (ZOFRAN -ODT) 4 MG disintegrating tablet Take 1 tablet (4 mg total) by mouth every 8 (eight) hours as needed for nausea or vomiting. 20 tablet Enedelia Going M, FNP   albuterol  (VENTOLIN  HFA) 108 (90 Base) MCG/ACT inhaler Inhale 2 puffs into the lungs every 6 (six) hours as needed for wheezing or shortness of breath. 18 g Enedelia Going HERO, FNP      I have reviewed the PDMP during this encounter.   Enedelia Going HERO, [place] 08/10/24 1913

## 2024-08-10 NOTE — Discharge Instructions (Addendum)
 You have bronchitis which is inflammation of the upper airways in your lungs due to a virus.   Your chest x-ray appears normal and does not show any signs of pneumonia by my interpretation, staff will call if the radiology reread shows abnormality requiring change in treatment plan.  We gave you a shot of steroid in the clinic today. Start taking prednisone  pills tomorrow as prescribed (2 pills once daily for 5 days with breakfast).  Use albuterol  2 puffs every 4-6 hours as needed for cough, shortness of breath, and wheezing.   Use guaifenesin (plain mucinex) to break up congestion in nose/chest so that you are able to excrete easier. Drink plenty of fluids to stay well hydrated while taking mucinex so that it works well in the body.   You may take Zofran  every 8 hours as needed for nausea and vomiting.  Promethazine  DM cough syrup at bedtime as needed.  Do not take this during the day as this can make you sleepy.  If you develop any new or worsening symptoms or if your symptoms do not start to improve, please return here or follow-up with your primary care provider. If your symptoms are severe, please go to the emergency room.

## 2024-08-10 NOTE — ED Triage Notes (Signed)
 Pt present with a cough that progressed and worsened. States she developed chest congestion on Tuesday. Reports chest tightness and sob when coughing.  Home interventions: cetrizine, robitussin

## 2024-09-07 ENCOUNTER — Other Ambulatory Visit (HOSPITAL_BASED_OUTPATIENT_CLINIC_OR_DEPARTMENT_OTHER): Payer: Self-pay | Admitting: Physician Assistant

## 2024-09-07 DIAGNOSIS — T148XXA Other injury of unspecified body region, initial encounter: Secondary | ICD-10-CM

## 2024-09-13 ENCOUNTER — Ambulatory Visit (INDEPENDENT_AMBULATORY_CARE_PROVIDER_SITE_OTHER): Admitting: Podiatry

## 2024-09-13 DIAGNOSIS — Z91199 Patient's noncompliance with other medical treatment and regimen due to unspecified reason: Secondary | ICD-10-CM

## 2024-09-13 NOTE — Progress Notes (Signed)
 No show

## 2024-09-20 ENCOUNTER — Ambulatory Visit (INDEPENDENT_AMBULATORY_CARE_PROVIDER_SITE_OTHER): Admitting: Podiatry

## 2024-09-20 ENCOUNTER — Ambulatory Visit (INDEPENDENT_AMBULATORY_CARE_PROVIDER_SITE_OTHER)

## 2024-09-20 ENCOUNTER — Encounter: Payer: Self-pay | Admitting: Podiatry

## 2024-09-20 DIAGNOSIS — Q6651 Congenital pes planus, right foot: Secondary | ICD-10-CM

## 2024-09-20 DIAGNOSIS — M7752 Other enthesopathy of left foot: Secondary | ICD-10-CM

## 2024-09-20 DIAGNOSIS — Q6652 Congenital pes planus, left foot: Secondary | ICD-10-CM

## 2024-09-20 DIAGNOSIS — M7750 Other enthesopathy of unspecified foot: Secondary | ICD-10-CM

## 2024-09-20 NOTE — Progress Notes (Signed)
  Subjective:  Patient ID: Gina Riley, female    DOB: [birth year],   MRN: [id number]  Chief Complaint  Patient presents with   Foot Pain    My left foot hurts a lot.  I can't stand for longer than an hour.  If I go over that, my whole foot throbs and I can't put pressure on it.    38 y.o. female presents for concern of left foot pain that has been ongoing for years. Relates the left foot turns inward. Relates can't be on her foot for more than an hour or she gets severe pain.  Patient has a history of cerebral palsy.  She used to have braces when she was a kid but has not had any in a long time. Has been prescribed gabapentin to help and not sure if helping.  Denies any other pedal complaints. Denies n/v/f/c.   Past Medical History:  Diagnosis Date   Cerebral palsy (HCC)     Objective:  Physical Exam: Vascular: DP/PT pulses 2/4 bilateral. CFT <3 seconds. Normal hair growth on digits. No edema.  Skin. No lacerations or abrasions bilateral feet.  Musculoskeletal: MMT 5/5 bilateral lower extremities in DF, PF, Inversion and Eversion. Deceased ROM in DF of ankle joint. Sever pes planus on the left mild on right. Collapse of medial arch and rocker bottom deformity with abduction of the foot able to reduce mostly on left but semi rigid. Upon standing unable to do single limb heel rise.  Neurological: Sensation intact to light touch.   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 Congenital pes planus, right      Plan:  Patient was evaluated and treated and all questions answered. -Xrays reviewed. No acute fractures or dislocations. Severe pes planus with collapse of medial arch at tarsal joints and near rocker bottom foot noted. Forefoot abduction noted. Mild pronation noted around tibiotalar joint as well.  -Discussed treatement options; discussed pes planus  deformity;conservative and  surgical  -Rx Orthotics to hanger provided.  -Recommend good supportive  shoes -Recommend daily stretching and icing -Recommend ibuprofen  or Tylenol  as needed -Patient to return to office as needed or sooner if condition worsens.   Asberry Failing, DPM

## 2024-09-20 NOTE — Patient Instructions (Signed)
 EXERCISES- RANGE OF MOTION (ROM) AND STRETCHING EXERCISES - Plantar Fasciitis (Heel Spur Syndrome) These exercises may help you when beginning to rehabilitate your injury. Your symptoms may resolve with or without further involvement from your physician, physical therapist or athletic trainer. While completing these exercises, remember:  Restoring tissue flexibility helps normal motion to return to the joints. This allows healthier, less painful movement and activity. An effective stretch should be held for at least 30 seconds. A stretch should never be painful. You should only feel a gentle lengthening or release in the stretched tissue.  RANGE OF MOTION - Toe Extension, Flexion Sit with your right / left leg crossed over your opposite knee. Grasp your toes and gently pull them back toward the top of your foot. You should feel a stretch on the bottom of your toes and/or foot. Hold this stretch for 10 seconds. Now, gently pull your toes toward the bottom of your foot. You should feel a stretch on the top of your toes and or foot. Hold this stretch for 10 seconds. Repeat  times. Complete this stretch 3 times per day.   RANGE OF MOTION - Ankle Dorsiflexion, Active Assisted Remove shoes and sit on a chair that is preferably not on a carpeted surface. Place right / left foot under knee. Extend your opposite leg for support. Keeping your heel down, slide your right / left foot back toward the chair until you feel a stretch at your ankle or calf. If you do not feel a stretch, slide your bottom forward to the edge of the chair, while still keeping your heel down. Hold this stretch for 10 seconds. Repeat 3 times. Complete this stretch 2 times per day.   STRETCH  Gastroc, Standing Place hands on wall. Extend right / left leg, keeping the front knee somewhat bent. Slightly point your toes inward on your back foot. Keeping your right / left heel on the floor and your knee straight, shift your  weight toward the wall, not allowing your back to arch. You should feel a gentle stretch in the right / left calf. Hold this position for 10 seconds. Repeat 3 times. Complete this stretch 2 times per day.  STRETCH  Soleus, Standing Place hands on wall. Extend right / left leg, keeping the other knee somewhat bent. Slightly point your toes inward on your back foot. Keep your right / left heel on the floor, bend your back knee, and slightly shift your weight over the back leg so that you feel a gentle stretch deep in your back calf. Hold this position for 10 seconds. Repeat 3 times. Complete this stretch 2 times per day.  STRETCH  Gastrocsoleus, Standing  Note: This exercise can place a lot of stress on your foot and ankle. Please complete this exercise only if specifically instructed by your caregiver.  Place the ball of your right / left foot on a step, keeping your other foot firmly on the same step. Hold on to the wall or a rail for balance. Slowly lift your other foot, allowing your body weight to press your heel down over the edge of the step. You should feel a stretch in your right / left calf. Hold this position for 10 seconds. Repeat this exercise with a slight bend in your right / left knee. Repeat 3 times. Complete this stretch 2 times per day.   STRENGTHENING EXERCISES - Plantar Fasciitis (Heel Spur Syndrome)  These exercises may help you when beginning to rehabilitate your  injury. They may resolve your symptoms with or without further involvement from your physician, physical therapist or athletic trainer. While completing these exercises, remember:  Muscles can gain both the endurance and the strength needed for everyday activities through controlled exercises. Complete these exercises as instructed by your physician, physical therapist or athletic trainer. Progress the resistance and repetitions only as guided.  STRENGTH - Towel Curls Sit in a chair positioned on a  non-carpeted surface. Place your foot on a towel, keeping your heel on the floor. Pull the towel toward your heel by only curling your toes. Keep your heel on the floor. Repeat 3 times. Complete this exercise 2 times per day.  STRENGTH - Ankle Inversion Secure one end of a rubber exercise band/tubing to a fixed object (table, pole). Loop the other end around your foot just before your toes. Place your fists between your knees. This will focus your strengthening at your ankle. Slowly, pull your big toe up and in, making sure the band/tubing is positioned to resist the entire motion. Hold this position for 10 seconds. Have your muscles resist the band/tubing as it slowly pulls your foot back to the starting position. Repeat 3 times. Complete this exercises 2 times per day.  Document Released: 12/02/2005 Document Revised: 02/24/2012 Document Reviewed: 03/16/2009 [facility] Patient Information 2014 [place], [place].

## 2024-09-27 ENCOUNTER — Ambulatory Visit: Admitting: Orthopedic Surgery

## 2024-09-27 DIAGNOSIS — M5441 Lumbago with sciatica, right side: Secondary | ICD-10-CM

## 2024-09-27 DIAGNOSIS — M79604 Pain in right leg: Secondary | ICD-10-CM | POA: Diagnosis not present

## 2024-09-27 DIAGNOSIS — M5442 Lumbago with sciatica, left side: Secondary | ICD-10-CM

## 2024-09-27 DIAGNOSIS — M6701 Short Achilles tendon (acquired), right ankle: Secondary | ICD-10-CM | POA: Diagnosis not present

## 2024-09-27 DIAGNOSIS — M6702 Short Achilles tendon (acquired), left ankle: Secondary | ICD-10-CM

## 2024-09-28 ENCOUNTER — Encounter: Payer: Self-pay | Admitting: Orthopedic Surgery

## 2024-09-28 NOTE — Progress Notes (Signed)
 Office Visit Note   Patient: Gina Riley           Date of Birth: [birth year]           MRN: [id number] Visit Date: 09/27/2024              Requested by: Ottie Dorthea KIDD, PA-C [address] [place],  [place] [address] PCP: Ottie Dorthea KIDD, PA-C  Chief Complaint  Patient presents with   Lower Back - Pain      HPI: Discussed the use of AI scribe software for clinical note transcription with the patient, who gave verbal consent to proceed.  History of Present Illness Gina Riley is a 38 year old female who presents with left foot pain and swelling.  She experiences significant pain and swelling in her left foot, describing the pain as severe and persistent. Financial constraints have prevented her from purchasing special insoles, which cost $400 and are not covered by her insurance.  She reports swelling at the site of her lumbar spine surgery incision, which she attributes to weather changes. The area has 'sores' but no redness, drainage, or open wounds. Periodic swelling occurs in this area.  She reports experiencing pain across the forefoot and notes that her foot is very flat and lacks an arch.  She is scheduled to start physical therapy on September 29, 2024, focusing on Achilles stretching. She has not participated in physical therapy for about a year.     Assessment & Plan: Visit Diagnoses:  1. Pain in right leg   2. Acute midline low back pain with bilateral sciatica   3. Achilles tendon contracture, bilateral     Plan: Assessment and Plan Assessment & Plan Left Achilles tendon contracture with associated left forefoot pain and callus due to overload 20-degree Achilles tendon contracture causing forefoot pain and callus. Flexed knee gait and toe ambulation noted. Orthotics unlikely to resolve contracture. Stretching therapy has 90% success rate. Surgery considered if therapy fails. - Start physical therapy focusing on Achilles stretching at [facility] on September 29, 2024. - Provide prescription for Achilles stretching exercises for physical therapy. - Instruct on home Achilles stretching exercises: stairs, balance on ball of foot, knees straight, heels down, 3-5 times daily for 1 minute each. - Reassess therapy progress; consider surgical intervention if inadequate.  Bilateral lower extremity spasticity and knee contractures Bilateral spasticity and knee contractures contribute to flexed knee gait. Further intervention may be needed if therapy is insufficient. - Monitor response to physical therapy and stretching. - Consider gastrocnemius recession if therapy shows inadequate improvement.  Status post lumbar spine fusion with intermittent incisional swelling Intermittent swelling post-lumbar fusion likely due to hardware irritation. No infection signs observed. - Reassured swelling is normal due to hardware irritation. - Monitor for infection signs or changes in swelling.      Follow-Up Instructions: No follow-ups on file.   Ortho Exam  Patient is alert, oriented, no adenopathy, well-dressed, normal affect, normal respiratory effort. Physical Exam MUSCULOSKELETAL: 20 degree contracture of the left Achilles tendon. SKIN: Lumbar spine scar stable, no redness, drainage, or open areas.  Patient ambulates with a hip flexion gait with knees flexed and contracture of both Achilles with weightbearing on the ball of her foot bilaterally    Imaging: No results found. No images are attached to the encounter.  Labs: Lab Results  Component Value Date   REPTSTATUS 09/11/2019 FINAL 09/09/2019   CULT >=100,000 COLONIES/mL ESCHERICHIA COLI (A) 09/09/2019  LABORGA ESCHERICHIA COLI (A) 09/09/2019     Lab Results  Component Value Date   ALBUMIN 3.9 05/20/2016   ALBUMIN 3.8 02/20/2015   ALBUMIN 3.6 12/13/2012    No results found for: MG No results found for: VD25OH  No results found for: PREALBUMIN    Latest Ref  Rng & Units 01/23/2022   11:27 AM 05/20/2016   12:00 PM 02/20/2015    8:23 AM  CBC EXTENDED  WBC 4.0 - 10.5 K/uL 4.7  6.0  3.9   RBC 3.87 - 5.11 MIL/uL 3.89  4.05  4.06   Hemoglobin 12.0 - 15.0 g/dL 87.6  87.5  87.6   HCT 36.0 - 46.0 % 36.5  36.2  36.7   Platelets 150 - 400 K/uL 158  169  153   NEUT# 1.7 - 7.7 K/uL   1.8   Lymph# 0.7 - 4.0 K/uL   1.6      There is no height or weight on file to calculate BMI.  Orders:  No orders of the defined types were placed in this encounter.  No orders of the defined types were placed in this encounter.    Procedures: No procedures performed  Clinical Data: No additional findings.  ROS:  All other systems negative, except as noted in the HPI. Review of Systems  Objective: Vital Signs: LMP 09/15/2024 (Approximate)   Specialty Comments:  No specialty comments available.  PMFS History: There are no active problems to display for this patient.  Past Medical History:  Diagnosis Date   Cerebral palsy (HCC)     Family History  Problem Relation Age of Onset   Vision loss Father     Past Surgical History:  Procedure Laterality Date   rhyzotomy for CP on spine so she could walk.  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

## 2024-09-29 ENCOUNTER — Ambulatory Visit: Attending: Physician Assistant | Admitting: Physical Therapy

## 2024-09-29 DIAGNOSIS — M6281 Muscle weakness (generalized): Secondary | ICD-10-CM | POA: Diagnosis present

## 2024-09-29 DIAGNOSIS — R2689 Other abnormalities of gait and mobility: Secondary | ICD-10-CM | POA: Diagnosis present

## 2024-09-29 DIAGNOSIS — M79605 Pain in left leg: Secondary | ICD-10-CM | POA: Diagnosis present

## 2024-09-29 DIAGNOSIS — Z9181 History of falling: Secondary | ICD-10-CM | POA: Insufficient documentation

## 2024-09-29 DIAGNOSIS — R2681 Unsteadiness on feet: Secondary | ICD-10-CM | POA: Insufficient documentation

## 2024-09-29 NOTE — Therapy (Signed)
 OUTPATIENT PHYSICAL THERAPY NEURO EVALUATION   Patient Name: Gina Riley MRN: [id number] DOB:May 13, 1986, 38 y.o., female Today's Date: 09/29/2024   PCP: Ottie Dorthea [name] (at [facility]) REFERRING PROVIDER: Ottie Dorthea [name]  END OF SESSION:  PT End of Session - 09/29/24 1132     Visit Number 1    Number of Visits 13   with eval   Date for Recertification  11/24/24   to allow for scheduling delays   Authorization Type [employer] Dual    PT Start Time 1130    PT Stop Time 1220    PT Time Calculation (min) 50 min    Activity Tolerance Patient tolerated treatment well    Behavior During Therapy [facility] for tasks assessed/performed          Past Medical History:  Diagnosis Date   Cerebral palsy [facility])    Past Surgical History:  Procedure Laterality Date   rhyzotomy for CP on spine so she could walk.  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   ONSET DATE: 09/07/2024  REFERRING DIAG: G80.9 (ICD-10-CM) - Congenital cerebral palsy (HCC)  THERAPY DIAG:  Muscle weakness (generalized)  Other abnormalities of gait and mobility  Unsteadiness on feet  History of falling  Pain in left leg  Rationale for Evaluation and Treatment: Rehabilitation  SUBJECTIVE:                                                                                                                                                                                             SUBJECTIVE STATEMENT:  Pt reports that she just saw Dr. Harden, he wants her to work on stretching her L Achilles as it has gotten tight again and she is trying to avoid having another surgery. She reports that she did have B heel cord releases and B HS releases when she was younger. Pt was also referred here by her PCP to address deficits related to her CP. She reports that recently her LLE has been bothering her more and that her L knee  will give out at times. 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 She also reports having swelling in her LLE and is unable to feel her leg at times.  She is interested in trialing a SPC or a RW to see if she would be safer and more functional with an AD. She stays very active at home, in the community, and with her church.  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  She has done PT in the past but it has been about a year. She is interested in learning how to get stronger and for guidance on what she can be doing at the gym. She is also looking to reduce her fall risk as she was told that her bones are brittle and she did fracture her rib with a fall. She also has spasms but is about to run out of baclofen . She has been sent to the [facility] to address her chronic pain, was taken off of her pain medication for now. They are recommending that she get knee injections but will decide the plan if the injections are not successful.  She does have a brace for her LLE that she received about a year ago but she doesn't wear it as it is heavy and painful. She can bring it to her next visit.   Pt accompanied by: self, does not drive (takes [employer] transportation, otherwise husband drives her)  PERTINENT HISTORY: PMH: Cerebral palsy, Chronic pain and chronic opioid management   PAIN:  Are you having pain? Yes: NPRS scale: 6/10 Pain location: L foot and LLE Pain description: constant Aggravating factors: cold, lack of movement Relieving factors: medication, getting up and moving around  PRECAUTIONS: Fall  RED FLAGS: Bowel or bladder incontinence: Yes: pt has noted an increased frequency in her urination that started about a month ago  WEIGHT BEARING RESTRICTIONS: No  FALLS: Has patient fallen in last 6 months? Yes.  Number of falls last fall was in her living room and L knee gave out on her, able to get back up herself; 5 falls in the last 6 months  LIVING ENVIRONMENT: Lives with: lives with their family (husband, 42 yo son) Lives in: House/apartment Stairs: No; one level apartment Has following equipment at home: None  PLOF: Independent with gait and Independent with transfers  PATIENT GOALS: to get stronger to have a regimen for my exercise on the daily  OBJECTIVE:  Note: Objective measures were completed at Evaluation unless otherwise noted.  DIAGNOSTIC FINDINGS: pending B knee xrays  COGNITION: Overall cognitive status: Within functional limits for tasks assessed   SENSATION: N/T in BLE and in her feet  EDEMA:  Gets swelling in feet L>R  MUSCLE LENGTH: Hamstrings: tight bilaterally Heel cords: tight bilaterally   POSTURE: rounded shoulders, forward head, and posterior pelvic tilt  LOWER EXTREMITY ROM:     Active  Right Eval Left Eval  Hip flexion  Tight/decreased  Hip extension    Hip abduction    Hip adduction    Hip internal rotation    Hip external rotation    Knee flexion    Knee extension Tight HS Tight HS  Ankle dorsiflexion    Ankle plantarflexion    Ankle inversion    Ankle eversion     (Blank rows = not tested)  LOWER EXTREMITY MMT:    MMT Right Eval Left Eval  Hip flexion 5 4  Hip extension    Hip abduction    Hip adduction    Hip internal rotation    Hip external rotation    Knee flexion 5 4  Knee extension 5 4  Ankle dorsiflexion 5 2-  Ankle plantarflexion    Ankle inversion  Ankle eversion    (Blank rows = not tested)  BED MOBILITY:  Mod I per patient report  TRANSFERS: Sit to stand: Modified independence  Assistive device utilized: None     Stand to sit: Modified independence  Assistive device utilized: None     Chair to chair: Modified independence  Assistive device utilized: None       Pt tends to lock out B knees prior to  fully standing  RAMP:  Not tested  CURB:  Not tested  STAIRS: Not tested GAIT: Findings:  Gait pattern: increased trunk and pelvic rotation, decreased arm swing- Right, decreased arm swing- Left, and decreased hip/knee flexion- Left Distance walked: various clinic distances Assistive device utilized: None Level of assistance: Modified independence   FUNCTIONAL TESTS:    [facility] PT Assessment - 09/29/24 1200       Ambulation/Gait   Gait velocity 32.8 ft over 12.19 sec = 2.69 ft/sec      Standardized Balance Assessment   Standardized Balance Assessment Timed Up and Go Test;Five Times Sit to Stand    Five times sit to stand comments  34.72 sec   no UE     Timed Up and Go Test   TUG Normal TUG    Normal TUG (seconds) 12.8   no AD                                                                                                                                     TREATMENT DATE:  PT Evaluation  TherEx To address BLE muscle tightness: Seated HS stretch 3 x 30 sec each B Seated gastroc stretch with gait belt 3 x 30 sec each B   Added to initial HEP, see bolded below   PATIENT EDUCATION: Education details: Eval findings, PT POC, results of OM and functional implications, initial HEP Person educated: Patient Education method: Explanation, Demonstration, Tactile cues, Verbal cues, and Handouts Education comprehension: verbalized understanding, returned demonstration, and needs further education  HOME EXERCISE PROGRAM: Access Code: [email or web address] URL: [email or web address] Date: 09/29/2024 Prepared by: Waddell Southgate  Exercises - Seated Hamstring Stretch  - 1 x daily - 7 x weekly - 1 sets - 3-5 reps - 30-60 sec hold - Seated Gastroc Stretch with Strap  - 1 x daily - 7 x weekly - 1 sets - 3-5 reps - 30-60 sec hold  GOALS: Goals reviewed with patient? Yes  SHORT TERM GOALS: Target date: 10/21/2024   Pt will be independent with initial HEP for improved  strength, balance, transfers and gait. Baseline: Goal status: INITIAL  2.  Pt will improve gait velocity to at least 3.0 ft/sec for improved gait efficiency and performance at mod I level  Baseline: 2.69 ft/sec mod I (10/15) Goal status: INITIAL  3.  Pt will improve 5 x STS to less than or equal to 30 seconds to demonstrate improved functional strength and transfer efficiency.  Baseline: 34.72 sec  LE bracing against chair (10/15) Goal status: INITIAL  4.  FGA to be assessed and STG set Baseline:  Goal status: INITIAL  5.  Pt will trial SPC and RW to determine if she can benefit from an AD Baseline:  Goal status: INITIAL   LONG TERM GOALS: Target date: 11/11/2024   Pt will be independent with final HEP for improved strength, balance, transfers and gait. Baseline:  Goal status: INITIAL  2.  Pt will improve gait velocity to at least 3.25 ft/sec for improved gait efficiency and performance at mod I level  Baseline: 2.69 ft/sec mod I (10/15) Goal status: INITIAL  3.  Pt will improve 5 x STS to less than or equal to 26 seconds to demonstrate improved functional strength and transfer efficiency.  Baseline: 34.72 sec LE bracing against chair (10/15) Goal status: INITIAL  4.  FGA to be assessed and LTG set Baseline:  Goal status: INITIAL  5.  Pt will obtain appropriate LRAD to increase her safety and independence with functional mobility and decrease her fall risk Baseline:  Goal status: INITIAL    ASSESSMENT:  CLINICAL IMPRESSION: Patient is a 38 year old female referred to Neuro OPPT for deficits related to her CP.   Pt's PMH is significant for: Cerebral palsy, Chronic pain and chronic opioid management.  The following deficits were present during the exam: decreased BLE ROM, decreased LLE strength, impaired LE sensation, increased pain, and balance impairments. Based on her fall history, sensory impairments, strength impairments, 5xSTS, and gait speed, pt is an increased  risk for falls. Pt would benefit from skilled PT to address these impairments and functional limitations to maximize functional mobility independence.    OBJECTIVE IMPAIRMENTS: Abnormal gait, decreased balance, decreased coordination, decreased knowledge of use of DME, decreased mobility, difficulty walking, decreased ROM, decreased strength, increased fascial restrictions, increased muscle spasms, impaired flexibility, impaired sensation, improper body mechanics, postural dysfunction, and pain.   ACTIVITY LIMITATIONS: carrying, lifting, bending, standing, squatting, stairs, transfers, bed mobility, and locomotion level  PARTICIPATION LIMITATIONS: meal prep, cleaning, laundry, driving, community activity, occupation, and church  PERSONAL FACTORS: Time since onset of injury/illness/exacerbation and 1-2 comorbidities:   Cerebral palsy, Chronic pain and chronic opioid management are also affecting patient's functional outcome.   REHAB POTENTIAL: Good  CLINICAL DECISION MAKING: Evolving/moderate complexity  EVALUATION COMPLEXITY: Moderate  PLAN:  PT FREQUENCY: 2x/week  PT DURATION: 6 weeks  PLANNED INTERVENTIONS: [id number]- PT Re-evaluation, 97750- Physical Performance Testing, 97110-Therapeutic exercises, 97530- Therapeutic activity, [id number]- Neuromuscular re-education, 97535- Self Care, [id number]- Manual therapy, [id number]- Gait training, [id number]- Orthotic Initial, [phone]- Orthotic/Prosthetic subsequent, [phone]- Aquatic Therapy, [phone]- Splinting, [id number]- Electrical stimulation (manual), [phone] (1-2 muscles), [id number] (3+ muscles)- Dry Needling, Patient/Family education, Balance training, Stair training, Taping, Joint mobilization, Spinal mobilization, DME instructions, Cryotherapy, and Moist heat  PLAN FOR NEXT SESSION: assess FGA and set STG/LTG, did she bring her LLE brace (KAFO?)?, did she discuss getting more balcofen with PCP? Work towards safe exercises she can do at the gym, trial [facility] and RW, how are initial  stretches?   Haydyn Girvan, PT Waddell Southgate, PT, DPT, CSRS  09/29/2024, 2:15 PM

## 2024-10-06 ENCOUNTER — Ambulatory Visit: Admitting: Physical Therapy

## 2024-10-06 ENCOUNTER — Telehealth: Payer: Self-pay | Admitting: Physical Therapy

## 2024-10-06 ENCOUNTER — Encounter: Payer: Self-pay | Admitting: Physical Therapy

## 2024-10-06 DIAGNOSIS — Z9181 History of falling: Secondary | ICD-10-CM

## 2024-10-06 DIAGNOSIS — R2689 Other abnormalities of gait and mobility: Secondary | ICD-10-CM

## 2024-10-06 DIAGNOSIS — M6281 Muscle weakness (generalized): Secondary | ICD-10-CM | POA: Diagnosis not present

## 2024-10-06 DIAGNOSIS — R2681 Unsteadiness on feet: Secondary | ICD-10-CM

## 2024-10-06 DIAGNOSIS — M79605 Pain in left leg: Secondary | ICD-10-CM

## 2024-10-06 NOTE — Telephone Encounter (Signed)
 Ottie Dorthea KIDD, PA-C   Gina Riley  was treated by PT on 10/06/2024.  The patient would benefit from a rollator (w/ brakes and a seat) for improved mobility.   If you agree, please place an order in [facility] workque in [facility] or fax the order to [phone].  Thank you,  Daved Bull, PT, DPT   [facility] [address] [place], [place]  [address] Phone:  [phone] Fax:  [phone]

## 2024-10-06 NOTE — Therapy (Signed)
 OUTPATIENT PHYSICAL THERAPY NEURO TREATMENT   Patient Name: Gina Riley MRN: [id number] DOB:1986-06-16, 38 y.o., female Today's Date: 10/06/2024   PCP: Ottie Dorthea [name] (at [facility]) REFERRING PROVIDER: Ottie Dorthea MALVA, PA-C  END OF SESSION:  PT End of Session - 10/06/24 1020     Visit Number 2    Number of Visits 13   with eval   Date for Recertification  11/24/24   to allow for scheduling delays   Authorization Type [employer] Dual    PT Start Time 1017    PT Stop Time 1100    PT Time Calculation (min) 43 min    Equipment Utilized During Treatment Gait belt    Activity Tolerance Patient tolerated treatment well    Behavior During Therapy WFL for tasks assessed/performed          Past Medical History:  Diagnosis Date   Cerebral palsy (HCC)    Past Surgical History:  Procedure Laterality Date   rhyzotomy for CP on spine so she could walk.  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  She denies recent falls or acute status changes.    She is being sent to another place for shoe inserts that is in network with her insurance so they will cover it.  She did not bring her brace today, but reports this new  place will also mold her for new ones per her MD.  She got her pain medicine refilled and baclofen  from PCP 2 days ago.  She was able to sleep better between the medicine and using a pregnancy pillow to prevent N/T and tossing and turning throughout the night.   Pt accompanied by: self, does not drive (takes [employer] transportation, otherwise husband drives her) - husband brought her today  PERTINENT HISTORY: PMH: Cerebral palsy, Chronic pain and chronic opioid management   PAIN:  Are you having pain? Yes: NPRS scale: 6/10 Pain location: L foot and LLE Pain description: constant; shooting Aggravating factors: prolonged standing Relieving factors: medication, getting up and moving around  PRECAUTIONS: Fall  RED FLAGS: Bowel or bladder incontinence: Yes: pt has noted an increased frequency in her urination that started about a month ago  WEIGHT BEARING RESTRICTIONS: No  FALLS: Has patient fallen in last 6 months? Yes. Number of falls last fall was in her living room and L knee gave out on her, able to get back up herself; 5 falls in the last 6 months  LIVING ENVIRONMENT: Lives with: lives with their family (husband, 67 yo son) Lives in: House/apartment Stairs: No; one level apartment Has following equipment at home: None  PLOF: Independent with gait and Independent with transfers  PATIENT GOALS: to get stronger to have a regimen for my exercise on the daily  OBJECTIVE:  Note: Objective measures were completed at Evaluation unless otherwise noted.  DIAGNOSTIC FINDINGS: pending B knee xrays  COGNITION: Overall cognitive status: Within functional limits for tasks assessed   SENSATION: N/T in BLE and in her feet  EDEMA:  Gets swelling in feet L>R  MUSCLE LENGTH: Hamstrings: tight bilaterally Heel cords: tight bilaterally   POSTURE: rounded shoulders, forward head, and posterior pelvic tilt  LOWER EXTREMITY ROM:     Active  Right Eval Left Eval  Hip flexion   Tight/decreased  Hip extension    Hip abduction    Hip adduction    Hip internal rotation    Hip external rotation    Knee flexion    Knee extension Tight HS Tight HS  Ankle dorsiflexion    Ankle plantarflexion    Ankle inversion    Ankle eversion     (Blank rows = not tested)  LOWER EXTREMITY MMT:    MMT Right Eval Left Eval  Hip flexion 5 4  Hip extension    Hip abduction    Hip adduction    Hip internal rotation    Hip external rotation    Knee flexion 5 4  Knee extension 5 4  Ankle dorsiflexion 5 2-  Ankle plantarflexion    Ankle inversion    Ankle eversion    (Blank rows = not tested)  BED MOBILITY:  Mod I per patient report  TRANSFERS: Sit to stand: Modified independence  Assistive device utilized: None     Stand to sit: Modified independence  Assistive device utilized: None     Chair to chair: Modified independence  Assistive device utilized: None       Pt tends to lock out B knees prior to fully standing  RAMP:  Not tested  CURB:  Not tested  STAIRS: Not tested GAIT: Findings:  Gait pattern: increased trunk and pelvic rotation, decreased arm swing- Right, decreased arm swing- Left, and decreased hip/knee flexion- Left Distance walked: various clinic distances Assistive device utilized: None Level of assistance: Modified independence   FUNCTIONAL TESTS:    [facility] PT Assessment - 10/06/24 1026       Functional Gait  Assessment   Gait assessed  Yes    Gait Level Surface Walks 20 ft in less than 7 sec but greater than 5.5 sec, uses assistive device, slower speed, mild gait deviations, or deviates 6-10 in outside of the 12 in walkway width.    Change in Gait Speed Able to change speed, demonstrates  mild gait deviations, deviates 6-10 in outside of the 12 in walkway width, or no gait deviations, unable to achieve a major change in velocity, or uses a change in velocity, or uses an assistive device.    Gait with Horizontal Head Turns Performs head  turns smoothly with slight change in gait velocity (eg, minor disruption to smooth gait path), deviates 6-10 in outside 12 in walkway width, or uses an assistive device.    Gait with Vertical Head Turns Performs head turns with no change in gait. Deviates no more than 6 in outside 12 in walkway width.    Gait and Pivot Turn Pivot turns safely in greater than 3 sec and stops with no loss of balance, or pivot turns safely within 3 sec and stops with mild imbalance, requires small steps to catch balance.    Step Over Obstacle Is able to step over one shoe box (4.5 in total height) but must slow down and adjust steps to clear box safely. May require verbal cueing.    Gait with Narrow Base of Support Ambulates less than 4 steps heel to toe or cannot perform without assistance.    Gait with Eyes Closed Walks 20 ft, uses assistive device, slower speed, mild gait deviations, deviates 6-10 in outside 12 in walkway width. Ambulates 20 ft in less than 9 sec but greater than 7 sec.    Ambulating Backwards Walks 20 ft, uses assistive device, slower speed, mild gait deviations, deviates 6-10 in outside 12 in walkway width.    Steps Alternating feet, must use rail.    Total Score 18    FGA comment: 18/30 = high fall risk                                                                                                                                       TREATMENT DATE:  10/06/2024 -FGA:  [facility] PT Assessment - 10/06/24 1026       Functional Gait  Assessment   Gait assessed  Yes    Gait Level Surface Walks 20 ft in less than 7 sec but greater than 5.5 sec, uses assistive device, slower speed, mild gait deviations, or deviates 6-10 in outside of the 12 in walkway width.    Change in Gait Speed Able to change speed, demonstrates mild gait deviations, deviates 6-10 in outside of the 12 in walkway width, or no gait deviations, unable to achieve a major change in velocity, or uses a change in velocity, or uses an  assistive device.    Gait with Horizontal Head Turns Performs head turns smoothly with slight change in gait velocity (eg, minor disruption to smooth gait path), deviates 6-10 in outside 12 in walkway width, or uses an assistive device.    Gait with Vertical Head Turns Performs head turns with no change in gait. Deviates no more than 6 in outside 12 in walkway width.  Gait and Pivot Turn Pivot turns safely in greater than 3 sec and stops with no loss of balance, or pivot turns safely within 3 sec and stops with mild imbalance, requires small steps to catch balance.    Step Over Obstacle Is able to step over one shoe box (4.5 in total height) but must slow down and adjust steps to clear box safely. May require verbal cueing.    Gait with Narrow Base of Support Ambulates less than 4 steps heel to toe or cannot perform without assistance.    Gait with Eyes Closed Walks 20 ft, uses assistive device, slower speed, mild gait deviations, deviates 6-10 in outside 12 in walkway width. Ambulates 20 ft in less than 9 sec but greater than 7 sec.    Ambulating Backwards Walks 20 ft, uses assistive device, slower speed, mild gait deviations, deviates 6-10 in outside 12 in walkway width.    Steps Alternating feet, must use rail.    Total Score 18    FGA comment: 18/30 = high fall risk         GAIT: Gait pattern: step through pattern, decreased arm swing- Left, decreased stride length, decreased ankle dorsiflexion- Right, decreased ankle dorsiflexion- Left, knee flexed in stance- Right, knee flexed in stance- Left, shuffling, scissoring, trunk flexed, narrow BOS, poor foot clearance- Right, and poor foot clearance- Left Distance walked: 230 ft Assistive device utilized: Single point cane Level of assistance: SBA and CGA Comments: No overt LOB despite mild left knee buckle towards end of distance, pt able to maintain upright.  Cues to return to sequencing intermittently, but pt verbalizes awareness of variance.   Good cane placement and pt reports feeling very stable with this option.  GAIT: Gait pattern: step through pattern, knee flexed in stance- Right, knee flexed in stance- Left, shuffling, narrow BOS, and poor foot clearance- Left Distance walked: 230 ft Assistive device utilized: Walker - 4 wheeled Level of assistance: Modified independence and SBA Comments: Improved speed, pt maintains good upright posture w/o knee buckle.  Improved stride and no noted scissoring.  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  She is able to go up and down a ramp independently w/ mild crouching posture on descent but no hesitancy.   PATIENT EDUCATION: Education details: Bring brace to future visit.  Continue HEP.  FGA interpretation.  Ways to compensate for slouching/sliding off the couch w/ wedging vs using arm rest to prop.  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  Obtaining rollator order. Person educated: Patient Education method: Explanation, Demonstration, Tactile cues, Verbal cues, and Handouts Education comprehension: verbalized understanding, returned demonstration, and needs further education  HOME EXERCISE PROGRAM: Access Code: [email or web address] URL: [email or web address] Date: 09/29/2024 Prepared by: Waddell Southgate  Exercises - Seated Hamstring Stretch  - 1 x daily - 7 x weekly - 1 sets - 3-5 reps - 30-60 sec hold - Seated Gastroc Stretch with Strap  - 1 x daily - 7 x weekly - 1 sets - 3-5 reps - 30-60 sec hold  GOALS: Goals reviewed with patient? Yes  SHORT TERM GOALS: Target date: 10/21/2024   Pt will be independent with initial HEP for improved strength, balance, transfers and gait. Baseline: Goal status: INITIAL  2.  Pt will improve gait velocity to at least 3.0 ft/sec for improved gait efficiency and performance at mod I level  Baseline:  2.69 ft/sec mod I  (10/15) Goal status: INITIAL  3.  Pt will improve 5 x STS to less than or equal to 30 seconds to demonstrate improved functional strength and transfer efficiency.  Baseline: 34.72 sec LE bracing against chair (10/15) Goal status: INITIAL  4.  Pt will improve FGA score to >/=22/30 in order to demonstrate improved balance and decreased fall risk. Baseline: 18/30 (10/22) Goal status: INITIAL  5.  Pt will trial SPC and RW to determine if she can benefit from an AD Baseline:  Goal status: INITIAL   LONG TERM GOALS: Target date: 11/11/2024   Pt will be independent with final HEP for improved strength, balance, transfers and gait. Baseline:  Goal status: INITIAL  2.  Pt will improve gait velocity to at least 3.25 ft/sec for improved gait efficiency and performance at mod I level  Baseline: 2.69 ft/sec mod I (10/15) Goal status: INITIAL  3.  Pt will improve 5 x STS to less than or equal to 26 seconds to demonstrate improved functional strength and transfer efficiency.  Baseline: 34.72 sec LE bracing against chair (10/15) Goal status: INITIAL  4.  Pt will improve FGA score to >/=26/30 in order to demonstrate improved balance and decreased fall risk. Baseline: 18/30 (10/22) Goal status: INITIAL  5.  Pt will obtain appropriate LRAD to increase her safety and independence with functional mobility and decrease her fall risk Baseline:  Goal status: INITIAL    ASSESSMENT:  CLINICAL IMPRESSION: Pt returns for initial treatment session following evaluation without significant change.  Her score of 18/30 on FGA today indicates elevated fall risk with pt most challenged by tandem gait, stair descent, and obstacle management.  She has been working on her initial HEP.  Her gait pattern and safety was much improved with use of a rollator today and pt feels this would meet multiple home and community mobility needs so PT to pursue.  Will continue per POC.    OBJECTIVE IMPAIRMENTS: Abnormal  gait, decreased balance, decreased coordination, decreased knowledge of use of DME, decreased mobility, difficulty walking, decreased ROM, decreased strength, increased fascial restrictions, increased muscle spasms, impaired flexibility, impaired sensation, improper body mechanics, postural dysfunction, and pain.   ACTIVITY LIMITATIONS: carrying, lifting, bending, standing, squatting, stairs, transfers, bed mobility, and locomotion level  PARTICIPATION LIMITATIONS: meal prep, cleaning, laundry, driving, community activity, occupation, and church  PERSONAL FACTORS: Time since onset of injury/illness/exacerbation and 1-2 comorbidities:   Cerebral palsy, Chronic pain and chronic opioid management are also affecting patient's functional outcome.   REHAB POTENTIAL: Good  CLINICAL DECISION MAKING: Evolving/moderate complexity  EVALUATION COMPLEXITY: Moderate  PLAN:  PT FREQUENCY: 2x/week  PT DURATION: 6 weeks  PLANNED INTERVENTIONS: 97164- PT Re-evaluation, 97750- Physical Performance Testing, 97110-Therapeutic exercises, 97530- Therapeutic activity, [id number]- Neuromuscular re-education, 97535- Self Care, [id number]- Manual therapy, [id number]- Gait training, [phone]- Orthotic Initial, [phone]- Orthotic/Prosthetic subsequent, [phone]- Aquatic Therapy, [phone]- Splinting, [phone]- Electrical stimulation (manual), [phone] (1-2 muscles), [id number] (3+ muscles)- Dry Needling, Patient/Family education, Balance training, Stair training, Taping, Joint mobilization, Spinal mobilization, DME instructions, Cryotherapy, and Moist heat  PLAN FOR NEXT SESSION:  did she bring her LLE brace (KAFO?)?, Work towards safe exercises she can do at the gym, how are initial stretches?  Did we get rollator order?  Daved KATHEE Bull, PT, DPT  10/06/2024, 11:09 AM

## 2024-10-08 ENCOUNTER — Ambulatory Visit: Admitting: Physical Therapy

## 2024-10-12 ENCOUNTER — Ambulatory Visit: Admitting: Physical Therapy

## 2024-10-12 ENCOUNTER — Telehealth: Payer: Self-pay | Admitting: Physical Therapy

## 2024-10-12 NOTE — Telephone Encounter (Signed)
 Cindy O. Lamielle, PA-C  Thank you for placing the order for a rollator for Gina Riley. Would you also be able to addend your most recent face to face note with [name] to include your recommendation for a rollator and send that over to our clinic to then be forwarded to the DME provider for insurance requirements?  If you agree, please place an order in [facility] workque in [facility] or fax the order to [phone].  Thank you,  Waddell Southgate, PT, DPT, [facility] [address] [place], [place]  [address] Phone:  [phone] Fax:  [phone]

## 2024-10-12 NOTE — Telephone Encounter (Signed)
 This is to document my attempt to call patient after no-show for PT appt this AM.  This is patient's # 1 missed appt.   Primary phone number(s) was used in efforts to contact the patient.   Spoke to patient to remind them of clinic attendance policy and upcoming therapy visit.   She states she did not see her appt on the Calendar but will be here for 10/31 11am appt.  Daved Bull, PT, DPT

## 2024-10-15 ENCOUNTER — Ambulatory Visit: Admitting: Physical Therapy

## 2024-10-15 ENCOUNTER — Telehealth: Payer: Self-pay | Admitting: Physical Therapy

## 2024-10-15 NOTE — Therapy (Incomplete)
 OUTPATIENT PHYSICAL THERAPY NEURO TREATMENT   Patient Name: Gina Riley MRN: [id number] DOB:05/03/1986, 38 y.o., female Today's Date: 10/15/2024   PCP: Ottie Dorthea KIDD, PA-C (at [facility]) REFERRING PROVIDER: Ottie Dorthea KIDD [name]  END OF SESSION:    Past Medical History:  Diagnosis Date   Cerebral palsy [facility])    Past Surgical History:  Procedure Laterality Date   rhyzotomy for CP on spine so she could walk.  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   ONSET DATE: 09/07/2024  REFERRING DIAG: G80.9 (ICD-10-CM) - Congenital cerebral palsy (HCC)  THERAPY DIAG:  No diagnosis found.  Rationale for Evaluation and Treatment: Rehabilitation  SUBJECTIVE:                                                                                                                                                                                             SUBJECTIVE STATEMENT:  She has some ongoing left foot swelling from standing to usher at church this weekend, she reports she is now allowed to use a chair as needed.  She denies recent falls or acute status changes.    She is being sent to another place for shoe inserts that is in network with her insurance so they will cover it.  She did not bring her brace today, but reports this new place will also mold her for new ones per her MD.  She got her pain medicine refilled and baclofen  from PCP 2 days ago.  She was able to sleep better between the medicine and using a pregnancy pillow to prevent N/T and tossing and turning throughout the night.  ***   Pt accompanied by: self, does not drive (takes [employer] transportation, otherwise husband drives her) - husband brought her today  PERTINENT HISTORY: PMH: Cerebral palsy, Chronic pain and chronic opioid management   PAIN:  Are you having pain? Yes: NPRS scale: 6/10 Pain location: L foot  and LLE Pain description: constant; shooting Aggravating factors: prolonged standing Relieving factors: medication, getting up and moving around  PRECAUTIONS: Fall  RED FLAGS: Bowel or bladder incontinence: Yes: pt has noted an increased frequency in her urination that started about a month ago  WEIGHT BEARING RESTRICTIONS: No  FALLS: Has patient fallen in last 6 months? Yes. Number of falls last fall was in her living room and L knee gave out on her, able to get back up herself; 5 falls in the last 6 months  LIVING ENVIRONMENT: Lives with:  lives with their family (husband, 66 yo son) Lives in: House/apartment Stairs: No; one level apartment Has following equipment at home: None  PLOF: Independent with gait and Independent with transfers  PATIENT GOALS: to get stronger to have a regimen for my exercise on the daily  OBJECTIVE:  Note: Objective measures were completed at Evaluation unless otherwise noted.  DIAGNOSTIC FINDINGS: pending B knee xrays  COGNITION: Overall cognitive status: Within functional limits for tasks assessed   SENSATION: N/T in BLE and in her feet  EDEMA:  Gets swelling in feet L>R  MUSCLE LENGTH: Hamstrings: tight bilaterally Heel cords: tight bilaterally   POSTURE: rounded shoulders, forward head, and posterior pelvic tilt  LOWER EXTREMITY ROM:     Active  Right Eval Left Eval  Hip flexion  Tight/decreased  Hip extension    Hip abduction    Hip adduction    Hip internal rotation    Hip external rotation    Knee flexion    Knee extension Tight HS Tight HS  Ankle dorsiflexion    Ankle plantarflexion    Ankle inversion    Ankle eversion     (Blank rows = not tested)  LOWER EXTREMITY MMT:    MMT Right Eval Left Eval  Hip flexion 5 4  Hip extension    Hip abduction    Hip adduction    Hip internal rotation    Hip external rotation    Knee flexion 5 4  Knee extension 5 4  Ankle dorsiflexion 5 2-  Ankle plantarflexion     Ankle inversion    Ankle eversion    (Blank rows = not tested)  BED MOBILITY:  Mod I per patient report  TRANSFERS: Sit to stand: Modified independence  Assistive device utilized: None     Stand to sit: Modified independence  Assistive device utilized: None     Chair to chair: Modified independence  Assistive device utilized: None       Pt tends to lock out B knees prior to fully standing  RAMP:  Not tested  CURB:  Not tested  STAIRS: Not tested GAIT: Findings:  Gait pattern: increased trunk and pelvic rotation, decreased arm swing- Right, decreased arm swing- Left, and decreased hip/knee flexion- Left Distance walked: various clinic distances Assistive device utilized: None Level of assistance: Modified independence   FUNCTIONAL TESTS:                                                                                                                                   TREATMENT DATE:  10/15/2024   TherAct  TherEx  NMR      PATIENT EDUCATION: Education details: Bring brace to future visit.  Continue HEP.  FGA interpretation.  Ways to compensate for slouching/sliding off the couch w/ wedging vs using arm rest to prop.  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  Obtaining rollator order.*** Person educated: Patient Education method: Explanation, Demonstration, Tactile cues, Verbal cues, and Handouts Education comprehension: verbalized understanding, returned demonstration, and needs further education  HOME EXERCISE PROGRAM: Access Code: [email or web address] URL: [email or web address] Date: 09/29/2024 Prepared by: Waddell Southgate  Exercises - Seated Hamstring Stretch  - 1 x daily - 7 x weekly - 1 sets - 3-5 reps - 30-60 sec hold - Seated Gastroc Stretch with Strap  - 1 x daily - 7 x weekly - 1 sets - 3-5 reps - 30-60 sec hold  GOALS: Goals reviewed with patient? Yes  SHORT TERM GOALS:  Target date: 10/21/2024   Pt will be independent with initial HEP for improved strength, balance, transfers and gait. Baseline: Goal status: INITIAL  2.  Pt will improve gait velocity to at least 3.0 ft/sec for improved gait efficiency and performance at mod I level  Baseline: 2.69 ft/sec mod I (10/15) Goal status: INITIAL  3.  Pt will improve 5 x STS to less than or equal to 30 seconds to demonstrate improved functional strength and transfer efficiency.  Baseline: 34.72 sec LE bracing against chair (10/15) Goal status: INITIAL  4.  Pt will improve FGA score to >/=22/30 in order to demonstrate improved balance and decreased fall risk. Baseline: 18/30 (10/22) Goal status: INITIAL  5.  Pt will trial SPC and RW to determine if she can benefit from an AD Baseline:  Goal status: INITIAL   LONG TERM GOALS: Target date: 11/11/2024   Pt will be independent with final HEP for improved strength, balance, transfers and gait. Baseline:  Goal status: INITIAL  2.  Pt will improve gait velocity to at least 3.25 ft/sec for improved gait efficiency and performance at mod I level  Baseline: 2.69 ft/sec mod I (10/15) Goal status: INITIAL  3.  Pt will improve 5 x STS to less than or equal to 26 seconds to demonstrate improved functional strength and transfer efficiency.  Baseline: 34.72 sec LE bracing against chair (10/15) Goal status: INITIAL  4.  Pt will improve FGA score to >/=26/30 in order to demonstrate improved balance and decreased fall risk. Baseline: 18/30 (10/22) Goal status: INITIAL  5.  Pt will obtain appropriate LRAD to increase her safety and independence with functional mobility and decrease her fall risk Baseline:  Goal status: INITIAL    ASSESSMENT:  CLINICAL IMPRESSION: Emphasis of skilled PT session*** Continue POC.     OBJECTIVE IMPAIRMENTS: Abnormal gait, decreased balance, decreased coordination, decreased knowledge of use of DME, decreased mobility,  difficulty walking, decreased ROM, decreased strength, increased fascial restrictions, increased muscle spasms, impaired flexibility, impaired sensation, improper body mechanics, postural dysfunction, and pain.   ACTIVITY LIMITATIONS: carrying, lifting, bending, standing, squatting, stairs, transfers, bed mobility, and locomotion level  PARTICIPATION LIMITATIONS: meal prep, cleaning, laundry, driving, community activity, occupation, and church  PERSONAL FACTORS: Time since onset of injury/illness/exacerbation and 1-2 comorbidities:   Cerebral palsy, Chronic pain and chronic opioid management are also affecting patient's functional outcome.   REHAB POTENTIAL: Good  CLINICAL DECISION MAKING: Evolving/moderate complexity  EVALUATION COMPLEXITY: Moderate  PLAN:  PT FREQUENCY: 2x/week  PT DURATION: 6 weeks  PLANNED INTERVENTIONS: 97164- PT Re-evaluation, 97750- Physical Performance Testing, 97110-Therapeutic exercises, 97530- Therapeutic activity, [id number]- Neuromuscular re-education, 97535- Self Care, [id number]- Manual therapy, [id number]- Gait training, [phone]- Orthotic Initial, [phone]- Orthotic/Prosthetic subsequent, [phone]- Aquatic Therapy, [phone]- Splinting, [id number]- Electrical stimulation (manual), [phone] (1-2 muscles), [id number] (3+ muscles)- Dry Needling, Patient/Family education, Balance training, Stair training, Taping, Joint mobilization,  Spinal mobilization, DME instructions, Cryotherapy, and Moist heat  PLAN FOR NEXT SESSION:  did she bring her LLE brace (KAFO?)?, Work towards safe exercises she can do at the gym, how are initial stretches?***  Did we get rollator order?  Waddell Southgate, PT Waddell Southgate, PT, DPT, CSRS   10/15/2024, 8:31 AM

## 2024-10-18 ENCOUNTER — Encounter: Payer: Self-pay | Admitting: Radiology

## 2024-10-19 ENCOUNTER — Ambulatory Visit: Attending: Physician Assistant | Admitting: Physical Therapy

## 2024-10-19 DIAGNOSIS — R2681 Unsteadiness on feet: Secondary | ICD-10-CM | POA: Diagnosis present

## 2024-10-19 DIAGNOSIS — M6281 Muscle weakness (generalized): Secondary | ICD-10-CM | POA: Diagnosis present

## 2024-10-19 DIAGNOSIS — R2689 Other abnormalities of gait and mobility: Secondary | ICD-10-CM | POA: Diagnosis present

## 2024-10-19 DIAGNOSIS — M79605 Pain in left leg: Secondary | ICD-10-CM | POA: Diagnosis present

## 2024-10-19 DIAGNOSIS — Z9181 History of falling: Secondary | ICD-10-CM | POA: Insufficient documentation

## 2024-10-19 NOTE — Therapy (Signed)
 OUTPATIENT PHYSICAL THERAPY NEURO TREATMENT   Patient Name: Gina Riley MRN: [id number] DOB:1986-03-05, 38 y.o., female Today's Date: 10/19/2024   PCP: Ottie Dorthea [name] (at [facility]) REFERRING PROVIDER: Ottie Dorthea [name]  END OF SESSION:  PT End of Session - 10/19/24 1108     Visit Number 3    Number of Visits 13   with eval   Date for Recertification  11/24/24   to allow for scheduling delays   Authorization Type [employer] Dual    PT Start Time 1105   pt arrived late   PT Stop Time 1143    PT Time Calculation (min) 38 min    Equipment Utilized During Treatment Gait belt    Activity Tolerance Patient tolerated treatment well    Behavior During Therapy WFL for tasks assessed/performed           Past Medical History:  Diagnosis Date   Cerebral palsy (HCC)    Past Surgical History:  Procedure Laterality Date   rhyzotomy for CP on spine so she could walk.  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  She denies recent falls or acute status changes.    She is being sent to another place for shoe inserts that is in network with her insurance so they will cover it.  She did not bring her brace today,  but reports this new place will also mold her for new ones per her MD.  She got her pain medicine refilled and baclofen  from PCP 2 days ago.  She was able to sleep better between the medicine and using a pregnancy pillow to prevent N/T and tossing and turning throughout the night.  ***Pt did find her brace that she has received from Dr. Harden but it is broken,  got it about 6 months ago and put it in her closet.  She saw an orthopedic physician (at [facility]) and got knee injections on 10/27, she has noticed some improvement; not as achy or restless in her legs, her legs feel more normal Goes back to ortho doc on 12/9  Pt gets some tension in her back when sitting Continues to work on her stretches   Pt accompanied by: self, does not drive (takes [employer] transportation, otherwise husband drives her) - husband brought her today  PERTINENT HISTORY: PMH: Cerebral palsy, Chronic pain and chronic opioid management   PAIN:  Are you having pain? Yes: NPRS scale: 5/10 Pain location: L foot and LLE Pain description: constant; shooting Aggravating factors: prolonged standing Relieving factors: medication, getting up and moving around  PRECAUTIONS: Fall  RED FLAGS: Bowel or bladder incontinence: Yes: pt has noted an increased frequency in her urination that started about a month ago  WEIGHT BEARING RESTRICTIONS: No  FALLS: Has patient fallen in last 6 months? Yes. Number of falls last fall was in her living room and L knee gave out on her, able to get back up herself; 5 falls in the last 6 months  LIVING ENVIRONMENT: Lives with: lives with their family (husband, 59 yo son) Lives in: House/apartment Stairs: No; one level apartment Has following equipment at home: None  PLOF: Independent with gait and Independent with transfers  PATIENT GOALS: to get stronger to have a regimen for my exercise on the daily  OBJECTIVE:  Note: Objective measures were completed at Evaluation unless  otherwise noted.  DIAGNOSTIC FINDINGS: pending B knee xrays  COGNITION: Overall cognitive status: Within functional limits for tasks assessed   SENSATION: N/T in BLE and in her feet  EDEMA:  Gets swelling in feet L>R  MUSCLE LENGTH: Hamstrings: tight bilaterally Heel cords: tight bilaterally   POSTURE: rounded shoulders, forward head, and posterior pelvic tilt  LOWER EXTREMITY ROM:     Active  Right Eval Left Eval  Hip flexion  Tight/decreased  Hip extension    Hip abduction    Hip adduction    Hip internal rotation    Hip external rotation    Knee flexion    Knee extension Tight HS Tight HS  Ankle dorsiflexion    Ankle plantarflexion    Ankle inversion    Ankle eversion     (Blank rows = not tested)  LOWER EXTREMITY MMT:    MMT Right Eval Left Eval  Hip flexion 5 4  Hip extension    Hip abduction    Hip adduction    Hip internal rotation    Hip external rotation    Knee flexion 5 4  Knee extension 5 4  Ankle dorsiflexion 5 2-  Ankle plantarflexion    Ankle inversion    Ankle eversion    (Blank rows = not tested)  BED MOBILITY:  Mod I per patient report  TRANSFERS: Sit to stand: Modified independence  Assistive device utilized: None     Stand to sit: Modified independence  Assistive device utilized: None     Chair to chair: Modified independence  Assistive device utilized: None       Pt tends to lock out B knees prior to fully standing  RAMP:  Not tested  CURB:  Not tested  STAIRS: Not tested GAIT: Findings:  Gait pattern: increased trunk and pelvic rotation, decreased arm swing- Right, decreased arm swing- Left, and decreased hip/knee flexion- Left Distance walked: various clinic  distances Assistive device utilized: None Level of assistance: Modified independence   FUNCTIONAL TESTS:                                                                                                                                   TREATMENT DATE:   10/19/2024   TherAct To address complaints of low back pain and muscle tightness: LTR x 5 reps B with 30 sec hold PPT x 10 reps with 5 sec hold +bridge x 10 reps with 5 sec hold +marches x 10 reps with 5 sec hold SKTC 3 x 30 sec each B    PATIENT EDUCATION: Education details: Bring brace to future visit.  Continue HEP.  FGA interpretation.  Ways to compensate for slouching/sliding off the couch w/ wedging vs using arm rest to prop.  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  Obtaining rollator order.*** Person educated: Patient Education method: Explanation, Demonstration, Tactile cues, Verbal cues, and Handouts Education comprehension: verbalized understanding, returned demonstration, and needs further education  HOME EXERCISE PROGRAM: Access Code: [email or web address] URL: [email or web address] Date: 09/29/2024 Prepared by: Waddell Southgate  Exercises - Seated Hamstring Stretch  - 1 x daily - 7 x weekly - 1 sets - 3-5 reps - 30-60 sec hold - Seated Gastroc Stretch with Strap  - 1 x daily - 7 x weekly - 1 sets - 3-5 reps - 30-60 sec hold  - Supine Posterior Pelvic Tilt  - 1 x daily - 7 x weekly - 3 sets - 10 reps - Supine Lower Trunk Rotation  - 1 x daily - 7 x weekly - 1 sets - 3-5 reps - 30 sec hold - Supine Bridge  - 1 x daily - 7 x weekly - 3 sets - 10 reps - 5 sec hold - Supine March  - 1 x daily - 7 x weekly - 3 sets - 10 reps - Supine Single Knee to Chest Stretch  - 1 x daily - 7 x weekly - 1 sets - 3-5 reps - 30-60 hold - Modified Thomas Stretch  - 1 x daily - 7 x weekly - 1 sets - 3-5 reps - 30-60 sec hold  GOALS: Goals reviewed with patient? Yes  SHORT TERM GOALS: Target date: 10/21/2024   Pt will be independent with initial HEP for improved strength, balance, transfers and gait. Baseline: Goal status: INITIAL  2.  Pt will improve gait velocity to at least 3.0 ft/sec for improved gait  efficiency and performance at mod I level  Baseline: 2.69 ft/sec mod I (10/15) Goal status: INITIAL  3.  Pt will improve 5 x STS to less than or equal to 30 seconds to demonstrate improved functional strength and transfer efficiency.  Baseline: 34.72 sec LE bracing against chair (10/15) Goal status: INITIAL  4.  Pt will improve FGA score to >/=22/30 in order  to demonstrate improved balance and decreased fall risk. Baseline: 18/30 (10/22) Goal status: INITIAL  5.  Pt will trial SPC and RW to determine if she can benefit from an AD Baseline:  Goal status: INITIAL   LONG TERM GOALS: Target date: 11/11/2024   Pt will be independent with final HEP for improved strength, balance, transfers and gait. Baseline:  Goal status: INITIAL  2.  Pt will improve gait velocity to at least 3.25 ft/sec for improved gait efficiency and performance at mod I level  Baseline: 2.69 ft/sec mod I (10/15) Goal status: INITIAL  3.  Pt will improve 5 x STS to less than or equal to 26 seconds to demonstrate improved functional strength and transfer efficiency.  Baseline: 34.72 sec LE bracing against chair (10/15) Goal status: INITIAL  4.  Pt will improve FGA score to >/=26/30 in order to demonstrate improved balance and decreased fall risk. Baseline: 18/30 (10/22) Goal status: INITIAL  5.  Pt will obtain appropriate LRAD to increase her safety and independence with functional mobility and decrease her fall risk Baseline:  Goal status: INITIAL    ASSESSMENT:  CLINICAL IMPRESSION: Emphasis of skilled PT session*** Continue POC.     OBJECTIVE IMPAIRMENTS: Abnormal gait, decreased balance, decreased coordination, decreased knowledge of use of DME, decreased mobility, difficulty walking, decreased ROM, decreased strength, increased fascial restrictions, increased muscle spasms, impaired flexibility, impaired sensation, improper body mechanics, postural dysfunction, and pain.   ACTIVITY LIMITATIONS:  carrying, lifting, bending, standing, squatting, stairs, transfers, bed mobility, and locomotion level  PARTICIPATION LIMITATIONS: meal prep, cleaning, laundry, driving, community activity, occupation, and church  PERSONAL FACTORS: Time since onset of injury/illness/exacerbation and 1-2 comorbidities:   Cerebral palsy, Chronic pain and chronic opioid management are also affecting patient's functional outcome.   REHAB POTENTIAL: Good  CLINICAL DECISION MAKING: Evolving/moderate complexity  EVALUATION COMPLEXITY: Moderate  PLAN:  PT FREQUENCY: 2x/week  PT DURATION: 6 weeks  PLANNED INTERVENTIONS: 97164- PT Re-evaluation, 97750- Physical Performance Testing, 97110-Therapeutic exercises, 97530- Therapeutic activity, [id number]- Neuromuscular re-education, 97535- Self Care, 02859- Manual therapy, [id number]- Gait training, [phone]- Orthotic Initial, [phone]- Orthotic/Prosthetic subsequent, [phone]- Aquatic Therapy, [phone]- Splinting, [phone]- Electrical stimulation (manual), [phone] (1-2 muscles), 20561 (3+ muscles)- Dry Needling, Patient/Family education, Balance training, Stair training, Taping, Joint mobilization, Spinal mobilization, DME instructions, Cryotherapy, and Moist heat  PLAN FOR NEXT SESSION:  did she bring her LLE brace (KAFO?)?, Work towards safe exercises she can do at the gym, how are initial stretches?***  Did we get rollator order?  Hawa Henly, PT Waddell Southgate, PT, DPT, CSRS   10/19/2024, 11:43 AM

## 2024-10-22 ENCOUNTER — Ambulatory Visit: Admitting: Physical Therapy

## 2024-10-26 ENCOUNTER — Ambulatory Visit: Admitting: Physical Therapy

## 2024-10-26 ENCOUNTER — Encounter: Payer: Self-pay | Admitting: Physical Therapy

## 2024-10-26 VITALS — BP 119/81 | HR 61

## 2024-10-26 DIAGNOSIS — R2689 Other abnormalities of gait and mobility: Secondary | ICD-10-CM

## 2024-10-26 DIAGNOSIS — R2681 Unsteadiness on feet: Secondary | ICD-10-CM

## 2024-10-26 DIAGNOSIS — Z9181 History of falling: Secondary | ICD-10-CM

## 2024-10-26 DIAGNOSIS — M6281 Muscle weakness (generalized): Secondary | ICD-10-CM | POA: Diagnosis not present

## 2024-10-26 DIAGNOSIS — M79605 Pain in left leg: Secondary | ICD-10-CM

## 2024-10-26 NOTE — Therapy (Signed)
 OUTPATIENT PHYSICAL THERAPY NEURO TREATMENT   Patient Name: Gina Riley MRN: [id number] DOB:1986/09/21, 38 y.o., female Today's Date: 10/26/2024   PCP: Ottie Dorthea [name] (at [facility]) REFERRING PROVIDER: Ottie Dorthea [name]  END OF SESSION:  PT End of Session - 10/26/24 1104     Visit Number 4    Number of Visits 13   with eval   Date for Recertification  11/24/24   to allow for scheduling delays   Authorization Type [employer] Dual    PT Start Time 1102    PT Stop Time 1145    PT Time Calculation (min) 43 min    Equipment Utilized During Treatment Gait belt    Activity Tolerance Patient tolerated treatment well    Behavior During Therapy WFL for tasks assessed/performed           Past Medical History:  Diagnosis Date   Cerebral palsy (HCC)    Past Surgical History:  Procedure Laterality Date   rhyzotomy for CP on spine so she could walk.  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   ONSET DATE: 09/07/2024  REFERRING DIAG: G80.9 (ICD-10-CM) - Congenital cerebral palsy (HCC)  THERAPY DIAG:  Muscle weakness (generalized)  Other abnormalities of gait and mobility  Unsteadiness on feet  History of falling  Pain in left leg  Rationale for Evaluation and Treatment: Rehabilitation  SUBJECTIVE:                                                                                                                                                                                             SUBJECTIVE STATEMENT:  Pt recently had an in home [facility] visit with a nurse and she forgot to bring the paper so therapist could see findings.  She has had an off and on headache the past few days.  No headache right now, but did have one this morning and took a Tylenol  and drank some tea and it resolved.  Pt accompanied by: self, does not drive (takes [employer] transportation,  otherwise husband drives her) - husband brought her today  PERTINENT HISTORY: PMH: Cerebral palsy, Chronic pain and chronic opioid management   PAIN:  Are you having pain? Yes: NPRS scale: 5/10 Pain location: L hip and LLE Pain description: intermittent; shooting Aggravating factors: prolonged standing Relieving factors: medication, getting up and moving around  PRECAUTIONS: Fall  RED FLAGS: Bowel or bladder incontinence: Yes: pt has noted an increased frequency in her urination  that started about a month ago  WEIGHT BEARING RESTRICTIONS: No  FALLS: Has patient fallen in last 6 months? Yes. Number of falls last fall was in her living room and L knee gave out on her, able to get back up herself; 5 falls in the last 6 months  LIVING ENVIRONMENT: Lives with: lives with their family (husband, 55 yo son) Lives in: House/apartment Stairs: No; one level apartment Has following equipment at home: None  PLOF: Independent with gait and Independent with transfers  PATIENT GOALS: to get stronger to have a regimen for my exercise on the daily  OBJECTIVE:  Note: Objective measures were completed at Evaluation unless otherwise noted.  DIAGNOSTIC FINDINGS: pending B knee xrays  COGNITION: Overall cognitive status: Within functional limits for tasks assessed   SENSATION: N/T in BLE and in her feet  EDEMA:  Gets swelling in feet L>R  MUSCLE LENGTH: Hamstrings: tight bilaterally Heel cords: tight bilaterally   POSTURE: rounded shoulders, forward head, and posterior pelvic tilt  LOWER EXTREMITY ROM:     Active  Right Eval Left Eval  Hip flexion  Tight/decreased  Hip extension    Hip abduction    Hip adduction    Hip internal rotation    Hip external rotation    Knee flexion    Knee extension Tight HS Tight HS  Ankle dorsiflexion    Ankle plantarflexion    Ankle inversion    Ankle eversion     (Blank rows = not tested)  LOWER EXTREMITY MMT:    MMT Right Eval  Left Eval  Hip flexion 5 4  Hip extension    Hip abduction    Hip adduction    Hip internal rotation    Hip external rotation    Knee flexion 5 4  Knee extension 5 4  Ankle dorsiflexion 5 2-  Ankle plantarflexion    Ankle inversion    Ankle eversion    (Blank rows = not tested)  BED MOBILITY:  Mod I per patient report  TRANSFERS: Sit to stand: Modified independence  Assistive device utilized: None     Stand to sit: Modified independence  Assistive device utilized: None     Chair to chair: Modified independence  Assistive device utilized: None       Pt tends to lock out B knees prior to fully standing  RAMP:  Not tested  CURB:  Not tested  STAIRS: Not tested GAIT: Findings:  Gait pattern: increased trunk and pelvic rotation, decreased arm swing- Right, decreased arm swing- Left, and decreased hip/knee flexion- Left Distance walked: various clinic distances Assistive device utilized: None Level of assistance: Modified independence   FUNCTIONAL TESTS:    [facility] PT Assessment - 10/26/24 1121       Functional Gait  Assessment   Gait assessed  Yes    Gait Level Surface Walks 20 ft in less than 7 sec but greater than 5.5 sec, uses assistive device, slower speed, mild gait deviations, or deviates 6-10 in outside of the 12 in walkway width.    Change in Gait Speed Makes only minor adjustments to walking speed, or accomplishes a change in speed with significant gait deviations, deviates 10-15 in outside the 12 in walkway width, or changes speed but loses balance but is able to recover and continue walking.    Gait with Horizontal Head Turns Performs head turns smoothly with slight change in gait velocity (eg, minor disruption to smooth gait path), deviates 6-10 in outside 12  in walkway width, or uses an assistive device.    Gait with Vertical Head Turns Performs task with moderate change in gait velocity, slows down, deviates 10-15 in outside 12 in walkway width but recovers,  can continue to walk.    Gait and Pivot Turn Pivot turns safely in greater than 3 sec and stops with no loss of balance, or pivot turns safely within 3 sec and stops with mild imbalance, requires small steps to catch balance.    Step Over Obstacle Is able to step over one shoe box (4.5 in total height) without changing gait speed. No evidence of imbalance.    Gait with Narrow Base of Support Ambulates less than 4 steps heel to toe or cannot perform without assistance.   can do 6 steps w/ unilateral counter support prior to LOB   Gait with Eyes Closed Walks 20 ft, uses assistive device, slower speed, mild gait deviations, deviates 6-10 in outside 12 in walkway width. Ambulates 20 ft in less than 9 sec but greater than 7 sec.    Ambulating Backwards Walks 20 ft, uses assistive device, slower speed, mild gait deviations, deviates 6-10 in outside 12 in walkway width.    Steps Alternating feet, must use rail.    Total Score 16    FGA comment: 16/30; high fall risk                                                                                                                                        TREATMENT DATE:  10/26/2024 Self-Care/Home Management: -edu on face-to-face note and rollator -L BP in sitting prior to interventions: Vitals:   10/26/24 1109  BP: 119/81  Pulse: 61  -Discussed dropping frequency of HEP from x4 to x2 daily to avoid overstretching and aggravating clonus (pt has noticed increased clonus with increased activity)  TherAct 5xSTS:  24.69 sec no UE support :  10.56 sec no AD SBA = 0.95 m/sec OR 3.13 ft/sec FGA:  [facility] PT Assessment - 10/26/24 1121       Functional Gait  Assessment   Gait assessed  Yes    Gait Level Surface Walks 20 ft in less than 7 sec but greater than 5.5 sec, uses assistive device, slower speed, mild gait deviations, or deviates 6-10 in outside of the 12 in walkway width.    Change in Gait Speed Makes only minor adjustments to walking speed,  or accomplishes a change in speed with significant gait deviations, deviates 10-15 in outside the 12 in walkway width, or changes speed but loses balance but is able to recover and continue walking.    Gait with Horizontal Head Turns Performs head turns smoothly with slight change in gait velocity (eg, minor disruption to smooth gait path), deviates 6-10 in outside 12 in walkway width, or uses an assistive device.    Gait with Vertical Head Turns Performs task with moderate  change in gait velocity, slows down, deviates 10-15 in outside 12 in walkway width but recovers, can continue to walk.    Gait and Pivot Turn Pivot turns safely in greater than 3 sec and stops with no loss of balance, or pivot turns safely within 3 sec and stops with mild imbalance, requires small steps to catch balance.    Step Over Obstacle Is able to step over one shoe box (4.5 in total height) without changing gait speed. No evidence of imbalance.    Gait with Narrow Base of Support Ambulates less than 4 steps heel to toe or cannot perform without assistance.   can do 6 steps w/ unilateral counter support prior to LOB   Gait with Eyes Closed Walks 20 ft, uses assistive device, slower speed, mild gait deviations, deviates 6-10 in outside 12 in walkway width. Ambulates 20 ft in less than 9 sec but greater than 7 sec.    Ambulating Backwards Walks 20 ft, uses assistive device, slower speed, mild gait deviations, deviates 6-10 in outside 12 in walkway width.    Steps Alternating feet, must use rail.    Total Score 16    FGA comment: 16/30; high fall risk         SciFit x8 minutes using BUE/BLE up to level 5.0 focusing on large amplitude reciprocal mobility w/ stride goal of >/=10 inches, pt achieves 10.9 average inches.  PATIENT EDUCATION: Education details: continue HEP and added to HEP, pt to call PCP about face-to-face note addendum.  Progress towards goals and ways to practice challenging tandem stance at home.  Using  recumbent bike or stepper at gym - monitor knees w/ bike due to valgus posture. Person educated: Patient Education method: Explanation, Demonstration, Tactile cues, Verbal cues, and Handouts Education comprehension: verbalized understanding, returned demonstration, and needs further education  HOME EXERCISE PROGRAM: Access Code: [email or web address] URL: [email or web address] Date: 09/29/2024 Prepared by: Waddell Southgate  Exercises - Seated Hamstring Stretch  - 1 x daily - 7 x weekly - 1 sets - 3-5 reps - 30-60 sec hold - Seated Gastroc Stretch with Strap  - 1 x daily - 7 x weekly - 1 sets - 3-5 reps - 30-60 sec hold  - Supine Posterior Pelvic Tilt  - 1 x daily - 7 x weekly - 3 sets - 10 reps - Supine Lower Trunk Rotation  - 1 x daily - 7 x weekly - 1 sets - 3-5 reps - 30 sec hold - Supine Bridge  - 1 x daily - 7 x weekly - 3 sets - 10 reps - 5 sec hold - Supine March  - 1 x daily - 7 x weekly - 3 sets - 10 reps - Supine Single Knee to Chest Stretch  - 1 x daily - 7 x weekly - 1 sets - 3-5 reps - 30-60 hold - Modified Thomas Stretch  - 1 x daily - 7 x weekly - 1 sets - 3-5 reps - 30-60 sec hold -Tandem walking at counter and tandem holds - pt politely declined printout  GOALS: Goals reviewed with patient? Yes  SHORT TERM GOALS: Target date: 10/21/2024   Pt will be independent with initial HEP for improved strength, balance, transfers and gait. Baseline:  pt ind and completing up to 4x/day everyday (11/11) Goal status: MET  2.  Pt will improve gait velocity to at least 3.0 ft/sec for improved gait efficiency and performance at mod I level  Baseline: 2.69 ft/sec mod I (10/15); 3.13 ft/sec  no AD SBA (11/11) Goal status: IN PROGRESS  3.  Pt will improve 5 x STS to less than or equal to 30 seconds to demonstrate improved functional strength and transfer efficiency.  Baseline: 34.72 sec LE bracing against chair (10/15); 24.69 sec no UE support (11/11) Goal status: MET  4.  Pt will  improve FGA score to >/=22/30 in order to demonstrate improved balance and decreased fall risk. Baseline: 18/30 (10/22); 16/30 (11/11) Goal status: NOT MET  5.  Pt will trial SPC and RW to determine if she can benefit from an AD Baseline: order for rollator (11/11) Goal status: IN PROGRESS   LONG TERM GOALS: Target date: 11/11/2024   Pt will be independent with final HEP for improved strength, balance, transfers and gait. Baseline:  Goal status: INITIAL  2.  Pt will improve gait velocity to at least 3.25 ft/sec for improved gait efficiency and performance at mod I level  Baseline: 2.69 ft/sec mod I (10/15) Goal status: INITIAL  3.  Pt will improve 5 x STS to less than or equal to 26 seconds to demonstrate improved functional strength and transfer efficiency.  Baseline: 34.72 sec LE bracing against chair (10/15) Goal status: INITIAL  4.  Pt will improve FGA score to >/=20/30 in order to demonstrate improved balance and decreased fall risk. Baseline: 18/30 (10/22); 16/30 (11/11) Goal status: REVISED  5.  Pt will obtain appropriate LRAD to increase her safety and independence with functional mobility and decrease her fall risk Baseline:  Goal status: INITIAL    ASSESSMENT:  CLINICAL IMPRESSION: Still awaiting face-to-face notes to further pursue needed rollator for safest gait mechanics.  Assessed STGs w/ pt having downward fluctuation in FGA performance today.  PT added tandem exercises to HEP to better address pt challenge.  She demonstrated great improvement in STS mechanics and control decreasing time to 24.69 sec w/o UE support from prior 34.72 seconds.  Her gait speed has also improved to 3.13 ft/sec from prior 2.69 sec though she is currently not ambulating with an AD but would benefit from one for balance.  Continue POC.  OBJECTIVE IMPAIRMENTS: Abnormal gait, decreased balance, decreased coordination, decreased knowledge of use of DME, decreased mobility, difficulty  walking, decreased ROM, decreased strength, increased fascial restrictions, increased muscle spasms, impaired flexibility, impaired sensation, improper body mechanics, postural dysfunction, and pain.   ACTIVITY LIMITATIONS: carrying, lifting, bending, standing, squatting, stairs, transfers, bed mobility, and locomotion level  PARTICIPATION LIMITATIONS: meal prep, cleaning, laundry, driving, community activity, occupation, and church  PERSONAL FACTORS: Time since onset of injury/illness/exacerbation and 1-2 comorbidities:   Cerebral palsy, Chronic pain and chronic opioid management are also affecting patient's functional outcome.   REHAB POTENTIAL: Good  CLINICAL DECISION MAKING: Evolving/moderate complexity  EVALUATION COMPLEXITY: Moderate  PLAN:  PT FREQUENCY: 2x/week  PT DURATION: 6 weeks  PLANNED INTERVENTIONS: [id number]- PT Re-evaluation, 97750- Physical Performance Testing, 97110-Therapeutic exercises, 97530- Therapeutic activity, [id number]- Neuromuscular re-education, 97535- Self Care, [id number]- Manual therapy, [id number]- Gait training, [phone]- Orthotic Initial, [phone]- Orthotic/Prosthetic subsequent, [phone]- Aquatic Therapy, [phone]- Splinting, [id number]- Electrical stimulation (manual), [phone] (1-2 muscles), [id number] (3+ muscles)- Dry Needling, Patient/Family education, Balance training, Stair training, Taping, Joint mobilization, Spinal mobilization, DME instructions, Cryotherapy, and Moist heat  PLAN FOR NEXT SESSION: did we send rollator order to [employer] - did she call about face-to-face notes? Work towards safe exercises she can do at [employer], how is HEP?  Pt enjoys SciFit.    Daved KATHEE Bull, PT, DPT   10/26/2024, 11:46 AM

## 2024-10-29 ENCOUNTER — Ambulatory Visit: Admitting: Physical Therapy

## 2024-11-02 ENCOUNTER — Encounter: Payer: Self-pay | Admitting: Physical Therapy

## 2024-11-02 ENCOUNTER — Ambulatory Visit: Admitting: Physical Therapy

## 2024-11-02 VITALS — BP 106/69 | HR 76

## 2024-11-02 DIAGNOSIS — R2689 Other abnormalities of gait and mobility: Secondary | ICD-10-CM

## 2024-11-02 DIAGNOSIS — M6281 Muscle weakness (generalized): Secondary | ICD-10-CM

## 2024-11-02 DIAGNOSIS — R2681 Unsteadiness on feet: Secondary | ICD-10-CM

## 2024-11-02 DIAGNOSIS — Z9181 History of falling: Secondary | ICD-10-CM

## 2024-11-02 DIAGNOSIS — M79605 Pain in left leg: Secondary | ICD-10-CM

## 2024-11-02 NOTE — Therapy (Signed)
 OUTPATIENT PHYSICAL THERAPY NEURO TREATMENT   Patient Name: Gina Riley MRN: [id number] DOB:[birth year], 38 y.o., female Today's Date: 11/02/2024   PCP: Ottie Dorthea [name] (at [facility]) REFERRING PROVIDER: Ottie Dorthea MALVA, PA-C  END OF SESSION:  PT End of Session - 11/02/24 1108     Visit Number 5    Number of Visits 13   with eval   Date for Recertification  11/24/24   to allow for scheduling delays   Authorization Type [employer] Dual    PT Start Time 1103    PT Stop Time 1148    PT Time Calculation (min) 45 min    Equipment Utilized During Treatment Gait belt    Activity Tolerance Patient tolerated treatment well    Behavior During Therapy WFL for tasks assessed/performed           Past Medical History:  Diagnosis Date   Cerebral palsy (HCC)    Past Surgical History:  Procedure Laterality Date   rhyzotomy for CP on spine so she could walk.  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   ONSET DATE: 09/07/2024  REFERRING DIAG: G80.9 (ICD-10-CM) - Congenital cerebral palsy (HCC)  THERAPY DIAG:  Muscle weakness (generalized)  Other abnormalities of gait and mobility  Unsteadiness on feet  History of falling  Pain in left leg  Rationale for Evaluation and Treatment: Rehabilitation  SUBJECTIVE:                                                                                                                                                                                             SUBJECTIVE STATEMENT:  Pt recently had an in home [facility] visit - brought paper today for therapist to see assessment.  She reports recent headache last night and this has resolved.  She has some discomfort in her feet and knees today.  Pt accompanied by: self, does not drive (takes [employer] transportation, otherwise husband drives her) - husband brought her today  PERTINENT HISTORY:  PMH: Cerebral palsy, Chronic pain and chronic opioid management   PAIN:  Are you having pain? Yes: NPRS scale: 6/10 Pain location: feet and knees Pain description: achy Aggravating factors: prolonged standing Relieving factors: medication, getting up and moving around  PRECAUTIONS: Fall  RED FLAGS: Bowel or bladder incontinence: Yes: pt has noted an increased frequency in her urination that started about a month ago  WEIGHT BEARING RESTRICTIONS: No  FALLS: Has patient fallen in last 6 months? Yes.  Number of falls last fall was in her living room and L knee gave out on her, able to get back up herself; 5 falls in the last 6 months  LIVING ENVIRONMENT: Lives with: lives with their family (husband, 51 yo son) Lives in: House/apartment Stairs: No; one level apartment Has following equipment at home: None  PLOF: Independent with gait and Independent with transfers  PATIENT GOALS: to get stronger to have a regimen for my exercise on the daily  OBJECTIVE:  Note: Objective measures were completed at Evaluation unless otherwise noted.  DIAGNOSTIC FINDINGS: pending B knee xrays  COGNITION: Overall cognitive status: Within functional limits for tasks assessed   SENSATION: N/T in BLE and in her feet  EDEMA:  Gets swelling in feet L>R  MUSCLE LENGTH: Hamstrings: tight bilaterally Heel cords: tight bilaterally   POSTURE: rounded shoulders, forward head, and posterior pelvic tilt  LOWER EXTREMITY ROM:     Active  Right Eval Left Eval  Hip flexion  Tight/decreased  Hip extension    Hip abduction    Hip adduction    Hip internal rotation    Hip external rotation    Knee flexion    Knee extension Tight HS Tight HS  Ankle dorsiflexion    Ankle plantarflexion    Ankle inversion    Ankle eversion     (Blank rows = not tested)  LOWER EXTREMITY MMT:    MMT Right Eval Left Eval  Hip flexion 5 4  Hip extension    Hip abduction    Hip adduction    Hip internal  rotation    Hip external rotation    Knee flexion 5 4  Knee extension 5 4  Ankle dorsiflexion 5 2-  Ankle plantarflexion    Ankle inversion    Ankle eversion    (Blank rows = not tested)  BED MOBILITY:  Mod I per patient report  TRANSFERS: Sit to stand: Modified independence  Assistive device utilized: None     Stand to sit: Modified independence  Assistive device utilized: None     Chair to chair: Modified independence  Assistive device utilized: None       Pt tends to lock out B knees prior to fully standing  RAMP:  Not tested  CURB:  Not tested  STAIRS: Not tested GAIT: Findings:  Gait pattern: increased trunk and pelvic rotation, decreased arm swing- Right, decreased arm swing- Left, and decreased hip/knee flexion- Left Distance walked: various clinic distances Assistive device utilized: None Level of assistance: Modified independence   FUNCTIONAL TESTS:                                                                                                                                    TREATMENT DATE:  11/02/2024 Self-Care/Home Management: -edu on face-to-face note and rollator - pt called and was told they would call her back which has not happened so she intends  to follow-up again today. -R BP in sitting prior to interventions: Vitals:   11/02/24 1112  BP: 106/69  Pulse: 76  -Pt calls doctor's office at onset of session for therapist to help her follow-up about face-to-face notes.  Admin states they would have note addended and faxed to [facility] Bern[facility], PT).  NMR: Bilateral leg press at 40 lbs 3x12 working on motor control and maintaining knees in line with hips Forward, backward, and lateral squat walk 4x22 ft each direction SBA w/ red theraband  THERACT: SciFit x8 minutes using BUE/BLE in multi-peaks mode up to level 5.0 focusing on large amplitude reciprocal mobility w/ stride goal of >/=10 inches, pt achieves 8.6 average  inches.  PATIENT EDUCATION: Education details: continue HEP w/ additions. Using recumbent bike or stepper at gym - monitor knees w/ bike due to valgus posture.  Discussed her other gym routines and machines - recommendations for safety and challenge. Person educated: Patient Education method: Explanation, Demonstration, Tactile cues, Verbal cues, and Handouts Education comprehension: verbalized understanding, returned demonstration, and needs further education  HOME EXERCISE PROGRAM: Access Code: [email or web address] URL: [email or web address] Date: 09/29/2024 Prepared by: Waddell Southgate  Exercises - Seated Hamstring Stretch  - 1 x daily - 7 x weekly - 1 sets - 3-5 reps - 30-60 sec hold - Seated Gastroc Stretch with Strap  - 1 x daily - 7 x weekly - 1 sets - 3-5 reps - 30-60 sec hold  - Supine Posterior Pelvic Tilt  - 1 x daily - 7 x weekly - 3 sets - 10 reps - Supine Lower Trunk Rotation  - 1 x daily - 7 x weekly - 1 sets - 3-5 reps - 30 sec hold - Supine Bridge  - 1 x daily - 7 x weekly - 3 sets - 10 reps - 5 sec hold - Supine March  - 1 x daily - 7 x weekly - 3 sets - 10 reps - Supine Single Knee to Chest Stretch  - 1 x daily - 7 x weekly - 1 sets - 3-5 reps - 30-60 hold - Modified Thomas Stretch  - 1 x daily - 7 x weekly - 1 sets - 3-5 reps - 30-60 sec hold -Tandem walking at counter and tandem holds - pt politely declined printout - Side Stepping with Resistance at Thighs  - 1 x daily - 7 x weekly - 3 sets - 10 reps - Forward Backward Monster Walk with Band at Thighs and Counter Support  - 1 x daily - 7 x weekly - 3 sets - 10 reps  GOALS: Goals reviewed with patient? Yes  SHORT TERM GOALS: Target date: 10/21/2024   Pt will be independent with initial HEP for improved strength, balance, transfers and gait. Baseline:  pt ind and completing up to 4x/day everyday (11/11) Goal status: MET  2.  Pt will improve gait velocity to at least 3.0 ft/sec for improved gait efficiency and  performance at mod I level  Baseline: 2.69 ft/sec mod I (10/15); 3.13 ft/sec no AD SBA (11/11) Goal status: IN PROGRESS  3.  Pt will improve 5 x STS to less than or equal to 30 seconds to demonstrate improved functional strength and transfer efficiency.  Baseline: 34.72 sec LE bracing against chair (10/15); 24.69 sec no UE support (11/11) Goal status: MET  4.  Pt will improve FGA score to >/=22/30 in order to demonstrate improved balance and decreased fall risk. Baseline: 18/30 (10/22); 16/30 (11/11) Goal status: NOT  MET  5.  Pt will trial SPC and RW to determine if she can benefit from an AD Baseline: order for rollator (11/11) Goal status: IN PROGRESS   LONG TERM GOALS: Target date: 11/11/2024   Pt will be independent with final HEP for improved strength, balance, transfers and gait. Baseline:  Goal status: INITIAL  2.  Pt will improve gait velocity to at least 3.25 ft/sec for improved gait efficiency and performance at mod I level  Baseline: 2.69 ft/sec mod I (10/15) Goal status: INITIAL  3.  Pt will improve 5 x STS to less than or equal to 26 seconds to demonstrate improved functional strength and transfer efficiency.  Baseline: 34.72 sec LE bracing against chair (10/15) Goal status: INITIAL  4.  Pt will improve FGA score to >/=20/30 in order to demonstrate improved balance and decreased fall risk. Baseline: 18/30 (10/22); 16/30 (11/11) Goal status: REVISED  5.  Pt will obtain appropriate LRAD to increase her safety and independence with functional mobility and decrease her fall risk Baseline:  Goal status: INITIAL    ASSESSMENT:  CLINICAL IMPRESSION: Focus of skilled session today initially on obtaining needed PCP notes to obtain rollator. Progressed to addressing increased challenge in the gym with additions to HEP and review of some equipment available to her.  She would benefit from further hip strengthening to improve BLE positioning needed to improve gait  mechanics. Continue POC.  OBJECTIVE IMPAIRMENTS: Abnormal gait, decreased balance, decreased coordination, decreased knowledge of use of DME, decreased mobility, difficulty walking, decreased ROM, decreased strength, increased fascial restrictions, increased muscle spasms, impaired flexibility, impaired sensation, improper body mechanics, postural dysfunction, and pain.   ACTIVITY LIMITATIONS: carrying, lifting, bending, standing, squatting, stairs, transfers, bed mobility, and locomotion level  PARTICIPATION LIMITATIONS: meal prep, cleaning, laundry, driving, community activity, occupation, and church  PERSONAL FACTORS: Time since onset of injury/illness/exacerbation and 1-2 comorbidities:   Cerebral palsy, Chronic pain and chronic opioid management are also affecting patient's functional outcome.   REHAB POTENTIAL: Good  CLINICAL DECISION MAKING: Evolving/moderate complexity  EVALUATION COMPLEXITY: Moderate  PLAN:  PT FREQUENCY: 2x/week  PT DURATION: 6 weeks  PLANNED INTERVENTIONS: 97164- PT Re-evaluation, 97750- Physical Performance Testing, 97110-Therapeutic exercises, 97530- Therapeutic activity, [id number]- Neuromuscular re-education, 97535- Self Care, [id number]- Manual therapy, [id number]- Gait training, [phone]- Orthotic Initial, [id number]- Orthotic/Prosthetic subsequent, [phone]- Aquatic Therapy, [phone]- Splinting, [id number]- Electrical stimulation (manual), [phone] (1-2 muscles), [id number] (3+ muscles)- Dry Needling, Patient/Family education, Balance training, Stair training, Taping, Joint mobilization, Spinal mobilization, DME instructions, Cryotherapy, and Moist heat  PLAN FOR NEXT SESSION: did PT receive face-to-face note? Has she tried anything new at the gym? how is HEP?  Pt enjoys SciFit.  Hip strength.  Step ups.    Daved KATHEE Bull, PT, DPT   11/02/2024, 1:31 PM

## 2024-11-02 NOTE — Patient Instructions (Signed)
-   Side Stepping with Resistance at Thighs  - 1 x daily - 7 x weekly - 3 sets - 10 reps - Forward Backward Monster Walk with Band at Thighs and Counter Support  - 1 x daily - 7 x weekly - 3 sets - 10 reps

## 2024-11-05 ENCOUNTER — Ambulatory Visit: Admitting: Physical Therapy

## 2024-11-05 NOTE — Therapy (Incomplete)
 OUTPATIENT PHYSICAL THERAPY NEURO TREATMENT   Patient Name: Gina Riley MRN: [id number] DOB:30-Nov-1986, 38 y.o., female Today's Date: 11/05/2024   PCP: Gina Dorthea KIDD, PA-C (at [facility]) REFERRING PROVIDER: Ottie Dorthea Riley Gina Riley  END OF SESSION:     Past Medical History:  Diagnosis Date   Cerebral palsy [facility])    Past Surgical History:  Procedure Laterality Date   rhyzotomy for CP on spine so she could walk.  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   ONSET DATE: 09/07/2024  REFERRING DIAG: G80.9 (ICD-10-CM) - Congenital cerebral palsy (HCC)  THERAPY DIAG:  No diagnosis found.  Rationale for Evaluation and Treatment: Rehabilitation  SUBJECTIVE:                                                                                                                                                                                             SUBJECTIVE STATEMENT:  Pt recently had an in home [facility] visit - brought paper today for therapist to see assessment.  She reports recent headache last night and this has resolved.  She has some discomfort in her feet and knees today.  ***  Pt accompanied by: self, does not drive (takes [employer] transportation, otherwise husband drives her) - husband brought her today  PERTINENT HISTORY: PMH: Cerebral palsy, Chronic pain and chronic opioid management   PAIN:  Are you having pain? Yes: NPRS scale: 6/10 Pain location: feet and knees Pain description: achy Aggravating factors: prolonged standing Relieving factors: medication, getting up and moving around  PRECAUTIONS: Fall  RED FLAGS: Bowel or bladder incontinence: Yes: pt has noted an increased frequency in her urination that started about a month ago  WEIGHT BEARING RESTRICTIONS: No  FALLS: Has patient fallen in last 6 months? Yes. Number of falls last fall was in her living  room and L knee gave out on her, able to get back up herself; 5 falls in the last 6 months  LIVING ENVIRONMENT: Lives with: lives with their family (husband, 68 yo son) Lives in: House/apartment Stairs: No; one level apartment Has following equipment at home: None  PLOF: Independent with gait and Independent with transfers  PATIENT GOALS: to get stronger to have a regimen for my exercise on the daily  OBJECTIVE:  Note: Objective measures were completed at Evaluation unless otherwise noted.  DIAGNOSTIC FINDINGS: pending B knee xrays  COGNITION: Overall cognitive status: Within functional limits for tasks assessed   SENSATION: N/T in BLE and in  her feet  EDEMA:  Gets swelling in feet L>R  MUSCLE LENGTH: Hamstrings: tight bilaterally Heel cords: tight bilaterally   POSTURE: rounded shoulders, forward head, and posterior pelvic tilt  LOWER EXTREMITY ROM:     Active  Right Eval Left Eval  Hip flexion  Tight/decreased  Hip extension    Hip abduction    Hip adduction    Hip internal rotation    Hip external rotation    Knee flexion    Knee extension Tight HS Tight HS  Ankle dorsiflexion    Ankle plantarflexion    Ankle inversion    Ankle eversion     (Blank rows = not tested)  LOWER EXTREMITY MMT:    MMT Right Eval Left Eval  Hip flexion 5 4  Hip extension    Hip abduction    Hip adduction    Hip internal rotation    Hip external rotation    Knee flexion 5 4  Knee extension 5 4  Ankle dorsiflexion 5 2-  Ankle plantarflexion    Ankle inversion    Ankle eversion    (Blank rows = not tested)  BED MOBILITY:  Mod I per patient report  TRANSFERS: Sit to stand: Modified independence  Assistive device utilized: None     Stand to sit: Modified independence  Assistive device utilized: None     Chair to chair: Modified independence  Assistive device utilized: None       Pt tends to lock out B knees prior to fully standing  RAMP:  Not tested  CURB:   Not tested  STAIRS: Not tested GAIT: Findings:  Gait pattern: increased trunk and pelvic rotation, decreased arm swing- Right, decreased arm swing- Left, and decreased hip/knee flexion- Left Distance walked: various clinic distances Assistive device utilized: None Level of assistance: Modified independence   FUNCTIONAL TESTS:                                                                                                                                    TREATMENT DATE:  11/05/2024 Self-Care/Home Management: -edu on face-to-face note and rollator - pt called and was told they would call her back which has not happened so she intends to follow-up again today. -R BP in sitting prior to interventions: There were no vitals filed for this visit. -Pt calls doctor's office at onset of session for therapist to help her follow-up about face-to-face notes.  Admin states they would have note addended and faxed to [facility] Bernida, PT). ***  NMR: Bilateral leg press at 40 lbs 3x12 working on motor control and maintaining knees in line with hips Forward, backward, and lateral squat walk 4x22 ft each direction SBA w/ red theraband ***  THERACT: SciFit x8 minutes using BUE/BLE in multi-peaks mode up to level 5.0 focusing on large amplitude reciprocal mobility w/ stride goal of >/=10 inches, pt achieves 8.6 average inches. ***  PATIENT EDUCATION: Education details: continue HEP w/ additions. Using recumbent bike or stepper at gym - monitor knees w/ bike due to valgus posture.  Discussed her other gym routines and machines - recommendations for safety and challenge.*** Person educated: Patient Education method: Explanation, Demonstration, Tactile cues, Verbal cues, and Handouts Education comprehension: verbalized understanding, returned demonstration, and needs further education  HOME EXERCISE PROGRAM: Access Code: [email or web address] URL:  [email or web address] Date: 09/29/2024 Prepared by: Waddell Southgate  Exercises - Seated Hamstring Stretch  - 1 x daily - 7 x weekly - 1 sets - 3-5 reps - 30-60 sec hold - Seated Gastroc Stretch with Strap  - 1 x daily - 7 x weekly - 1 sets - 3-5 reps - 30-60 sec hold  - Supine Posterior Pelvic Tilt  - 1 x daily - 7 x weekly - 3 sets - 10 reps - Supine Lower Trunk Rotation  - 1 x daily - 7 x weekly - 1 sets - 3-5 reps - 30 sec hold - Supine Bridge  - 1 x daily - 7 x weekly - 3 sets - 10 reps - 5 sec hold - Supine March  - 1 x daily - 7 x weekly - 3 sets - 10 reps - Supine Single Knee to Chest Stretch  - 1 x daily - 7 x weekly - 1 sets - 3-5 reps - 30-60 hold - Modified Thomas Stretch  - 1 x daily - 7 x weekly - 1 sets - 3-5 reps - 30-60 sec hold -Tandem walking at counter and tandem holds - pt politely declined printout - Side Stepping with Resistance at Thighs  - 1 x daily - 7 x weekly - 3 sets - 10 reps - Forward Backward Monster Walk with Band at Thighs and Counter Support  - 1 x daily - 7 x weekly - 3 sets - 10 reps  GOALS: Goals reviewed with patient? Yes  SHORT TERM GOALS: Target date: 10/21/2024   Pt will be independent with initial HEP for improved strength, balance, transfers and gait. Baseline:  pt ind and completing up to 4x/day everyday (11/11) Goal status: MET  2.  Pt will improve gait velocity to at least 3.0 ft/sec for improved gait efficiency and performance at mod I level  Baseline: 2.69 ft/sec mod I (10/15); 3.13 ft/sec no AD SBA (11/11) Goal status: IN PROGRESS  3.  Pt will improve 5 x STS to less than or equal to 30 seconds to demonstrate improved functional strength and transfer efficiency.  Baseline: 34.72 sec LE bracing against chair (10/15); 24.69 sec no UE support (11/11) Goal status: MET  4.  Pt will improve FGA score to >/=22/30 in order to demonstrate improved balance and decreased fall risk. Baseline: 18/30 (10/22); 16/30 (11/11) Goal  status: NOT MET  5.  Pt will trial SPC and RW to determine if she can benefit from an AD Baseline: order for rollator (11/11) Goal status: IN PROGRESS   LONG TERM GOALS: Target date: 11/11/2024   Pt will be independent with final HEP for improved strength, balance, transfers and gait. Baseline:  Goal status: INITIAL  2.  Pt will improve gait velocity to at least 3.25 ft/sec for improved gait efficiency and performance at mod I level  Baseline: 2.69 ft/sec mod I (10/15) Goal status: INITIAL  3.  Pt will improve 5 x STS to less than or equal to 26 seconds to demonstrate improved functional strength and transfer efficiency.  Baseline: 34.72 sec LE bracing against chair (  10/15) Goal status: INITIAL  4.  Pt will improve FGA score to >/=20/30 in order to demonstrate improved balance and decreased fall risk. Baseline: 18/30 (10/22); 16/30 (11/11) Goal status: REVISED  5.  Pt will obtain appropriate LRAD to increase her safety and independence with functional mobility and decrease her fall risk Baseline:  Goal status: INITIAL    ASSESSMENT:  CLINICAL IMPRESSION: Focus of skilled session today initially on obtaining needed PCP notes to obtain rollator. Progressed to addressing increased challenge in the gym with additions to HEP and review of some equipment available to her.  She would benefit from further hip strengthening to improve BLE positioning needed to improve gait mechanics. Continue POC.  Emphasis of skilled PT session*** Continue POC.   OBJECTIVE IMPAIRMENTS: Abnormal gait, decreased balance, decreased coordination, decreased knowledge of use of DME, decreased mobility, difficulty walking, decreased ROM, decreased strength, increased fascial restrictions, increased muscle spasms, impaired flexibility, impaired sensation, improper body mechanics, postural dysfunction, and pain.   ACTIVITY LIMITATIONS: carrying, lifting, bending, standing, squatting, stairs, transfers, bed  mobility, and locomotion level  PARTICIPATION LIMITATIONS: meal prep, cleaning, laundry, driving, community activity, occupation, and church  PERSONAL FACTORS: Time since onset of injury/illness/exacerbation and 1-2 comorbidities:   Cerebral palsy, Chronic pain and chronic opioid management are also affecting patient's functional outcome.   REHAB POTENTIAL: Good  CLINICAL DECISION MAKING: Evolving/moderate complexity  EVALUATION COMPLEXITY: Moderate  PLAN:  PT FREQUENCY: 2x/week  PT DURATION: 6 weeks  PLANNED INTERVENTIONS: 97164- PT Re-evaluation, 97750- Physical Performance Testing, 97110-Therapeutic exercises, 97530- Therapeutic activity, [id number]- Neuromuscular re-education, 97535- Self Care, 02859- Manual therapy, [id number]- Gait training, [phone]- Orthotic Initial, [id number]- Orthotic/Prosthetic subsequent, [phone]- Aquatic Therapy, [phone]- Splinting, [id number]- Electrical stimulation (manual), [phone] (1-2 muscles), [id number] (3+ muscles)- Dry Needling, Patient/Family education, Balance training, Stair training, Taping, Joint mobilization, Spinal mobilization, DME instructions, Cryotherapy, and Moist heat  PLAN FOR NEXT SESSION: did PT receive face-to-face note? Has she tried anything new at the gym? how is HEP?  Pt enjoys SciFit.  Hip strength.  Step ups.***    Waddell Southgate, PT Waddell Southgate, PT, DPT, CSRS    11/05/2024, 7:56 AM

## 2024-11-08 ENCOUNTER — Ambulatory Visit: Admitting: Physical Therapy

## 2024-11-08 DIAGNOSIS — M6281 Muscle weakness (generalized): Secondary | ICD-10-CM

## 2024-11-08 DIAGNOSIS — R2689 Other abnormalities of gait and mobility: Secondary | ICD-10-CM

## 2024-11-08 DIAGNOSIS — R2681 Unsteadiness on feet: Secondary | ICD-10-CM

## 2024-11-08 DIAGNOSIS — M79605 Pain in left leg: Secondary | ICD-10-CM

## 2024-11-08 DIAGNOSIS — Z9181 History of falling: Secondary | ICD-10-CM

## 2024-11-08 NOTE — Therapy (Signed)
 OUTPATIENT PHYSICAL THERAPY NEURO TREATMENT   Patient Name: Gina Riley MRN: [id number] DOB:09-24-1986, 38 y.o., female Today's Date: 11/08/2024   PCP: Ottie Dorthea [name] (at [facility]) REFERRING PROVIDER: Ottie Dorthea MALVA, PA-C  END OF SESSION:  PT End of Session - 11/08/24 1108     Visit Number 6    Number of Visits 13   with eval   Date for Recertification  11/24/24   to allow for scheduling delays   Authorization Type [employer] Dual    PT Start Time 1105   pt arrived late   PT Stop Time 1144    PT Time Calculation (min) 39 min    Equipment Utilized During Treatment Gait belt    Activity Tolerance Patient tolerated treatment well    Behavior During Therapy WFL for tasks assessed/performed            Past Medical History:  Diagnosis Date   Cerebral palsy (HCC)    Past Surgical History:  Procedure Laterality Date   rhyzotomy for CP on spine so she could walk.  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   ONSET DATE: 09/07/2024  REFERRING DIAG: G80.9 (ICD-10-CM) - Congenital cerebral palsy (HCC)  THERAPY DIAG:  Muscle weakness (generalized)  Other abnormalities of gait and mobility  Unsteadiness on feet  History of falling  Pain in left leg  Rationale for Evaluation and Treatment: Rehabilitation  SUBJECTIVE:                                                                                                                                                                                             SUBJECTIVE STATEMENT: Pt reports having weakness in L knee, asking for more exercises. Her LLE will buckle on her sometimes, buckled on her 3 times yesterday cleaning the house.  She also did get her rollator last week and was able to use it at church and it was very helpful, didn't bring it today because she was running late but will try to bring it next visit.  Pt  accompanied by: self, does not drive (takes [employer] transportation, otherwise husband drives her) - husband brought her today  PERTINENT HISTORY: PMH: Cerebral palsy, Chronic pain and chronic opioid management   PAIN:  Are you having pain? Yes: NPRS scale: 7/10 Pain location: feet and knees Pain description: achy Aggravating factors: prolonged standing Relieving factors: medication, getting up and moving around  PRECAUTIONS: Fall  RED FLAGS: Bowel or bladder incontinence:  Yes: pt has noted an increased frequency in her urination that started about a month ago  WEIGHT BEARING RESTRICTIONS: No  FALLS: Has patient fallen in last 6 months? Yes. Number of falls last fall was in her living room and L knee gave out on her, able to get back up herself; 5 falls in the last 6 months  LIVING ENVIRONMENT: Lives with: lives with their family (husband, 24 yo son) Lives in: House/apartment Stairs: No; one level apartment Has following equipment at home: None  PLOF: Independent with gait and Independent with transfers  PATIENT GOALS: to get stronger to have a regimen for my exercise on the daily  OBJECTIVE:  Note: Objective measures were completed at Evaluation unless otherwise noted.  DIAGNOSTIC FINDINGS: pending B knee xrays  COGNITION: Overall cognitive status: Within functional limits for tasks assessed   SENSATION: N/T in BLE and in her feet  EDEMA:  Gets swelling in feet L>R  MUSCLE LENGTH: Hamstrings: tight bilaterally Heel cords: tight bilaterally   POSTURE: rounded shoulders, forward head, and posterior pelvic tilt  LOWER EXTREMITY ROM:     Active  Right Eval Left Eval  Hip flexion  Tight/decreased  Hip extension    Hip abduction    Hip adduction    Hip internal rotation    Hip external rotation    Knee flexion    Knee extension Tight HS Tight HS  Ankle dorsiflexion    Ankle plantarflexion    Ankle inversion    Ankle eversion     (Blank rows = not  tested)  LOWER EXTREMITY MMT:    MMT Right Eval Left Eval  Hip flexion 5 4  Hip extension    Hip abduction    Hip adduction    Hip internal rotation    Hip external rotation    Knee flexion 5 4  Knee extension 5 4  Ankle dorsiflexion 5 2-  Ankle plantarflexion    Ankle inversion    Ankle eversion    (Blank rows = not tested)  BED MOBILITY:  Mod I per patient report  TRANSFERS: Sit to stand: Modified independence  Assistive device utilized: None     Stand to sit: Modified independence  Assistive device utilized: None     Chair to chair: Modified independence  Assistive device utilized: None       Pt tends to lock out B knees prior to fully standing  RAMP:  Not tested  CURB:  Not tested  STAIRS: Not tested GAIT: Findings:  Gait pattern: increased trunk and pelvic rotation, decreased arm swing- Right, decreased arm swing- Left, and decreased hip/knee flexion- Left Distance walked: various clinic distances Assistive device utilized: None Level of assistance: Modified independence   FUNCTIONAL TESTS:  TREATMENT DATE:  11/05/2024  NMR: To work on increased LLE WB and NMR: Staggered sit to stand with LLE back/RLE forwards 3 x 10 reps Focus on eccentric control when sitting and flexing knees Mini-squats 3 x 10 reps   Added to HEP, see bolded below  THERACT: SciFit multi-peaks level 6 for 8 minutes using BUE/BLEs for neural priming for reciprocal movement, dynamic cardiovascular warmup and increased amplitude of stepping. RPE of 8/10 following activity    PATIENT EDUCATION: Education details: continue HEP w/ additions. Bring rollator next visit. Person educated: Patient Education method: Explanation, Demonstration, Tactile cues, Verbal cues, and Handouts Education comprehension: verbalized understanding, returned  demonstration, and needs further education  HOME EXERCISE PROGRAM: Access Code: [email or web address] URL: [email or web address] Date: 09/29/2024 Prepared by: Waddell Southgate  Exercises - Seated Hamstring Stretch  - 1 x daily - 7 x weekly - 1 sets - 3-5 reps - 30-60 sec hold - Seated Gastroc Stretch with Strap  - 1 x daily - 7 x weekly - 1 sets - 3-5 reps - 30-60 sec hold  - Supine Posterior Pelvic Tilt  - 1 x daily - 7 x weekly - 3 sets - 10 reps - Supine Lower Trunk Rotation  - 1 x daily - 7 x weekly - 1 sets - 3-5 reps - 30 sec hold - Supine Bridge  - 1 x daily - 7 x weekly - 3 sets - 10 reps - 5 sec hold - Supine March  - 1 x daily - 7 x weekly - 3 sets - 10 reps - Supine Single Knee to Chest Stretch  - 1 x daily - 7 x weekly - 1 sets - 3-5 reps - 30-60 hold - Modified Thomas Stretch  - 1 x daily - 7 x weekly - 1 sets - 3-5 reps - 30-60 sec hold -Tandem walking at counter and tandem holds - pt politely declined printout - Side Stepping with Resistance at Thighs  - 1 x daily - 7 x weekly - 3 sets - 10 reps - Forward Backward Monster Walk with Band at Thighs and Counter Support  - 1 x daily - 7 x weekly - 3 sets - 10 reps - Staggered Sit-to-Stand  - 1 x daily - 7 x weekly - 3 sets - 10 reps - Mini Squat with Counter Support  - 1 x daily - 7 x weekly - 3 sets - 10 reps  GOALS: Goals reviewed with patient? Yes  SHORT TERM GOALS: Target date: 10/21/2024   Pt will be independent with initial HEP for improved strength, balance, transfers and gait. Baseline:  pt ind and completing up to 4x/day everyday (11/11) Goal status: MET  2.  Pt will improve gait velocity to at least 3.0 ft/sec for improved gait efficiency and performance at mod I level  Baseline: 2.69 ft/sec mod I (10/15); 3.13 ft/sec no AD SBA (11/11) Goal status: IN PROGRESS  3.  Pt will improve 5 x STS to less than or equal to 30 seconds to demonstrate improved functional strength and transfer efficiency.  Baseline: 34.72  sec LE bracing against chair (10/15); 24.69 sec no UE support (11/11) Goal status: MET  4.  Pt will improve FGA score to >/=22/30 in order to demonstrate improved balance and decreased fall risk. Baseline: 18/30 (10/22); 16/30 (11/11) Goal status: NOT MET  5.  Pt will trial SPC and RW to determine if she can benefit from an AD Baseline: order for rollator (11/11) Goal status: IN PROGRESS  LONG TERM GOALS: Target date: 11/11/2024   Pt will be independent with final HEP for improved strength, balance, transfers and gait. Baseline:  Goal status: INITIAL  2.  Pt will improve gait velocity to at least 3.25 ft/sec for improved gait efficiency and performance at mod I level  Baseline: 2.69 ft/sec mod I (10/15) Goal status: INITIAL  3.  Pt will improve 5 x STS to less than or equal to 26 seconds to demonstrate improved functional strength and transfer efficiency.  Baseline: 34.72 sec LE bracing against chair (10/15) Goal status: INITIAL  4.  Pt will improve FGA score to >/=20/30 in order to demonstrate improved balance and decreased fall risk. Baseline: 18/30 (10/22); 16/30 (11/11) Goal status: REVISED  5.  Pt will obtain appropriate LRAD to increase her safety and independence with functional mobility and decrease her fall risk Baseline:  Goal status: INITIAL    ASSESSMENT:  CLINICAL IMPRESSION: Emphasis of skilled PT session on continuing to work on addressing impaired strength in LLE and adding to patient's HEP. Pt with good ability to perform mini-squats and staggered sit to stands this visit, added to her HEP. She continues to benefit from continued BLE strengthening in order to improve her gait mechanics and decrease her fall risk. Continue POC.   OBJECTIVE IMPAIRMENTS: Abnormal gait, decreased balance, decreased coordination, decreased knowledge of use of DME, decreased mobility, difficulty walking, decreased ROM, decreased strength, increased fascial restrictions,  increased muscle spasms, impaired flexibility, impaired sensation, improper body mechanics, postural dysfunction, and pain.   ACTIVITY LIMITATIONS: carrying, lifting, bending, standing, squatting, stairs, transfers, bed mobility, and locomotion level  PARTICIPATION LIMITATIONS: meal prep, cleaning, laundry, driving, community activity, occupation, and church  PERSONAL FACTORS: Time since onset of injury/illness/exacerbation and 1-2 comorbidities:   Cerebral palsy, Chronic pain and chronic opioid management are also affecting patient's functional outcome.   REHAB POTENTIAL: Good  CLINICAL DECISION MAKING: Evolving/moderate complexity  EVALUATION COMPLEXITY: Moderate  PLAN:  PT FREQUENCY: 2x/week  PT DURATION: 6 weeks  PLANNED INTERVENTIONS: 97164- PT Re-evaluation, 97750- Physical Performance Testing, 97110-Therapeutic exercises, 97530- Therapeutic activity, [id number]- Neuromuscular re-education, 97535- Self Care, [id number]- Manual therapy, [id number]- Gait training, [phone]- Orthotic Initial, [id number]- Orthotic/Prosthetic subsequent, [phone]- Aquatic Therapy, [phone]- Splinting, [id number]- Electrical stimulation (manual), [phone] (1-2 muscles), [phone] (3+ muscles)- Dry Needling, Patient/Family education, Balance training, Stair training, Taping, Joint mobilization, Spinal mobilization, DME instructions, Cryotherapy, and Moist heat  PLAN FOR NEXT SESSION: did she bring rollator? Has she tried anything new at the gym? how is HEP?  Pt enjoys SciFit.  Hip strength.  Step ups. Deeper squats. Standing ball toss with progression to medicine ball toss?    Tonnette Zwiebel, PT Waddell Southgate, PT, DPT, CSRS    11/08/2024, 11:44 AM

## 2024-11-10 ENCOUNTER — Ambulatory Visit: Admitting: Physical Therapy

## 2024-11-10 DIAGNOSIS — M6281 Muscle weakness (generalized): Secondary | ICD-10-CM

## 2024-11-10 DIAGNOSIS — Z9181 History of falling: Secondary | ICD-10-CM

## 2024-11-10 DIAGNOSIS — R2689 Other abnormalities of gait and mobility: Secondary | ICD-10-CM

## 2024-11-10 DIAGNOSIS — M79605 Pain in left leg: Secondary | ICD-10-CM

## 2024-11-10 DIAGNOSIS — R2681 Unsteadiness on feet: Secondary | ICD-10-CM

## 2024-11-10 NOTE — Therapy (Signed)
 OUTPATIENT PHYSICAL THERAPY NEURO TREATMENT   Patient Name: Gina Riley MRN: [id number] DOB:1985/12/20, 38 y.o., female Today's Date: 11/10/2024   PCP: Ottie Dorthea [name] (at [facility]) REFERRING PROVIDER: Ottie Dorthea MALVA, PA-C  END OF SESSION:  PT End of Session - 11/10/24 1106     Visit Number 7    Number of Visits 13   with eval   Date for Recertification  11/24/24   to allow for scheduling delays   Authorization Type [employer] Dual    PT Start Time 1105    PT Stop Time 1145    PT Time Calculation (min) 40 min    Equipment Utilized During Treatment Gait belt    Activity Tolerance Patient tolerated treatment well    Behavior During Therapy WFL for tasks assessed/performed             Past Medical History:  Diagnosis Date   Cerebral palsy (HCC)    Past Surgical History:  Procedure Laterality Date   rhyzotomy for CP on spine so she could walk.  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   ONSET DATE: 09/07/2024  REFERRING DIAG: G80.9 (ICD-10-CM) - Congenital cerebral palsy (HCC)  THERAPY DIAG:  Muscle weakness (generalized)  Other abnormalities of gait and mobility  Unsteadiness on feet  History of falling  Pain in left leg  Rationale for Evaluation and Treatment: Rehabilitation  SUBJECTIVE:                                                                                                                                                                                             SUBJECTIVE STATEMENT:  Pt reports her pain is doing pretty good today, took her medications and took a shower. Pt denies any falls but reports that her L knee continues to buckle on her at times. She does bring her rollator to PT today.  Pt accompanied by: self, does not drive (takes [employer] transportation, otherwise husband drives her) - husband brought her today  PERTINENT  HISTORY: PMH: Cerebral palsy, Chronic pain and chronic opioid management   PAIN:  Are you having pain? Yes: NPRS scale: 7/10 Pain location: feet and knees Pain description: achy Aggravating factors: prolonged standing Relieving factors: medication, getting up and moving around  PRECAUTIONS: Fall  RED FLAGS: Bowel or bladder incontinence: Yes: pt has noted an increased frequency in her urination that started about a month ago  WEIGHT BEARING RESTRICTIONS: No  FALLS: Has patient fallen in  last 6 months? Yes. Number of falls last fall was in her living room and L knee gave out on her, able to get back up herself; 5 falls in the last 6 months  LIVING ENVIRONMENT: Lives with: lives with their family (husband, 30 yo son) Lives in: House/apartment Stairs: No; one level apartment Has following equipment at home: None  PLOF: Independent with gait and Independent with transfers  PATIENT GOALS: to get stronger to have a regimen for my exercise on the daily  OBJECTIVE:  Note: Objective measures were completed at Evaluation unless otherwise noted.  DIAGNOSTIC FINDINGS: pending B knee xrays  COGNITION: Overall cognitive status: Within functional limits for tasks assessed   SENSATION: N/T in BLE and in her feet  EDEMA:  Gets swelling in feet L>R  MUSCLE LENGTH: Hamstrings: tight bilaterally Heel cords: tight bilaterally   POSTURE: rounded shoulders, forward head, and posterior pelvic tilt  LOWER EXTREMITY ROM:     Active  Right Eval Left Eval  Hip flexion  Tight/decreased  Hip extension    Hip abduction    Hip adduction    Hip internal rotation    Hip external rotation    Knee flexion    Knee extension Tight HS Tight HS  Ankle dorsiflexion    Ankle plantarflexion    Ankle inversion    Ankle eversion     (Blank rows = not tested)  LOWER EXTREMITY MMT:    MMT Right Eval Left Eval  Hip flexion 5 4  Hip extension    Hip abduction    Hip adduction    Hip  internal rotation    Hip external rotation    Knee flexion 5 4  Knee extension 5 4  Ankle dorsiflexion 5 2-  Ankle plantarflexion    Ankle inversion    Ankle eversion    (Blank rows = not tested)  BED MOBILITY:  Mod I per patient report  TRANSFERS: Sit to stand: Modified independence  Assistive device utilized: None     Stand to sit: Modified independence  Assistive device utilized: None     Chair to chair: Modified independence  Assistive device utilized: None       Pt tends to lock out B knees prior to fully standing  RAMP:  Not tested  CURB:  Not tested  STAIRS: Not tested GAIT: Findings:  Gait pattern: increased trunk and pelvic rotation, decreased arm swing- Right, decreased arm swing- Left, and decreased hip/knee flexion- Left Distance walked: various clinic distances Assistive device utilized: None Level of assistance: Modified independence   FUNCTIONAL TESTS:                                                                                                                                    TREATMENT DATE:  11/10/2024  NMR: To work on increased LLE WB and NMR: Wide tandem stance with LLE back performing sunshine ball toss  2  x 30 reps Romberg stance performing sunshine ball toss 2 x 30 reps Normal stance on airex performing sunshine ball toss X 30 reps Romberg stance on airex performing sunshine ball toss X 30 reps Alt L/R 6 eccentric step downs with BUE support 2 x 10 reps B Alt L/R 6 step ups with one UE support 2 x 10 reps B     THERACT: Assisted her with adjusting her rollator height and decreasing squeaking of her wheels.   PATIENT EDUCATION: Education details: continue HEP  Person educated: Patient Education method: Explanation, Demonstration, Tactile cues, and Verbal cues Education comprehension: verbalized understanding, returned demonstration, and needs further education  HOME EXERCISE PROGRAM: Access Code: [email or web address] URL:  [email or web address] Date: 09/29/2024 Prepared by: Waddell Southgate  Exercises - Seated Hamstring Stretch  - 1 x daily - 7 x weekly - 1 sets - 3-5 reps - 30-60 sec hold - Seated Gastroc Stretch with Strap  - 1 x daily - 7 x weekly - 1 sets - 3-5 reps - 30-60 sec hold  - Supine Posterior Pelvic Tilt  - 1 x daily - 7 x weekly - 3 sets - 10 reps - Supine Lower Trunk Rotation  - 1 x daily - 7 x weekly - 1 sets - 3-5 reps - 30 sec hold - Supine Bridge  - 1 x daily - 7 x weekly - 3 sets - 10 reps - 5 sec hold - Supine March  - 1 x daily - 7 x weekly - 3 sets - 10 reps - Supine Single Knee to Chest Stretch  - 1 x daily - 7 x weekly - 1 sets - 3-5 reps - 30-60 hold - Modified Thomas Stretch  - 1 x daily - 7 x weekly - 1 sets - 3-5 reps - 30-60 sec hold -Tandem walking at counter and tandem holds - pt politely declined printout - Side Stepping with Resistance at Thighs  - 1 x daily - 7 x weekly - 3 sets - 10 reps - Forward Backward Monster Walk with Band at Thighs and Counter Support  - 1 x daily - 7 x weekly - 3 sets - 10 reps - Staggered Sit-to-Stand  - 1 x daily - 7 x weekly - 3 sets - 10 reps - Mini Squat with Counter Support  - 1 x daily - 7 x weekly - 3 sets - 10 reps   GOALS: Goals reviewed with patient? Yes  SHORT TERM GOALS: Target date: 10/21/2024   Pt will be independent with initial HEP for improved strength, balance, transfers and gait. Baseline:  pt ind and completing up to 4x/day everyday (11/11) Goal status: MET  2.  Pt will improve gait velocity to at least 3.0 ft/sec for improved gait efficiency and performance at mod I level  Baseline: 2.69 ft/sec mod I (10/15); 3.13 ft/sec no AD SBA (11/11) Goal status: IN PROGRESS  3.  Pt will improve 5 x STS to less than or equal to 30 seconds to demonstrate improved functional strength and transfer efficiency.  Baseline: 34.72 sec LE bracing against chair (10/15); 24.69 sec no UE support (11/11) Goal status: MET  4.   Pt will improve FGA score to >/=22/30 in order to demonstrate improved balance and decreased fall risk. Baseline: 18/30 (10/22); 16/30 (11/11) Goal status: NOT MET  5.  Pt will trial SPC and RW to determine if she can benefit from an AD Baseline: order for rollator (11/11) Goal status: IN PROGRESS   LONG TERM  GOALS: Target date: 11/24/2024 (updated to match end of cert date)   Pt will be independent with final HEP for improved strength, balance, transfers and gait. Baseline:  Goal status: INITIAL  2.  Pt will improve gait velocity to at least 3.25 ft/sec for improved gait efficiency and performance at mod I level  Baseline: 2.69 ft/sec mod I (10/15) Goal status: INITIAL  3.  Pt will improve 5 x STS to less than or equal to 26 seconds to demonstrate improved functional strength and transfer efficiency.  Baseline: 34.72 sec LE bracing against chair (10/15) Goal status: INITIAL  4.  Pt will improve FGA score to >/=20/30 in order to demonstrate improved balance and decreased fall risk. Baseline: 18/30 (10/22); 16/30 (11/11) Goal status: REVISED  5.  Pt will obtain appropriate LRAD to increase her safety and independence with functional mobility and decrease her fall risk Baseline: obtained rollator (11/26) Goal status: MET    ASSESSMENT:  CLINICAL IMPRESSION: Emphasis of skilled PT session on continuing to work on addressing impaired strength in LLE and adjusting patient's rollator that she brings with her this session. She continues to exhibit decreased LLE strength and control as compared to her RLE. She continues to benefit from continued BLE strengthening in order to improve her gait mechanics and decrease her fall risk. Continue POC.   OBJECTIVE IMPAIRMENTS: Abnormal gait, decreased balance, decreased coordination, decreased knowledge of use of DME, decreased mobility, difficulty walking, decreased ROM, decreased strength, increased fascial restrictions, increased muscle  spasms, impaired flexibility, impaired sensation, improper body mechanics, postural dysfunction, and pain.   ACTIVITY LIMITATIONS: carrying, lifting, bending, standing, squatting, stairs, transfers, bed mobility, and locomotion level  PARTICIPATION LIMITATIONS: meal prep, cleaning, laundry, driving, community activity, occupation, and church  PERSONAL FACTORS: Time since onset of injury/illness/exacerbation and 1-2 comorbidities:   Cerebral palsy, Chronic pain and chronic opioid management are also affecting patient's functional outcome.   REHAB POTENTIAL: Good  CLINICAL DECISION MAKING: Evolving/moderate complexity  EVALUATION COMPLEXITY: Moderate  PLAN:  PT FREQUENCY: 2x/week  PT DURATION: 6 weeks  PLANNED INTERVENTIONS: 97164- PT Re-evaluation, 97750- Physical Performance Testing, 97110-Therapeutic exercises, 97530- Therapeutic activity, [id number]- Neuromuscular re-education, 97535- Self Care, 02859- Manual therapy, [id number]- Gait training, [phone]- Orthotic Initial, [phone]- Orthotic/Prosthetic subsequent, [phone]- Aquatic Therapy, [phone]- Splinting, [phone]- Electrical stimulation (manual), [phone] (1-2 muscles), [phone] (3+ muscles)- Dry Needling, Patient/Family education, Balance training, Stair training, Taping, Joint mobilization, Spinal mobilization, DME instructions, Cryotherapy, and Moist heat  PLAN FOR NEXT SESSION: Has she tried anything new at the gym? how is HEP?  Pt enjoys SciFit.  Hip strength.  Step ups. Deeper squats. Anything to increase WB or NMR on LLE (SLS, staggered stance on step, etc)    Waddell Southgate, PT Waddell Southgate, PT, DPT, CSRS    11/10/2024, 11:46 AM

## 2024-11-16 ENCOUNTER — Ambulatory Visit: Admitting: Physical Therapy

## 2024-11-16 NOTE — Therapy (Incomplete)
 OUTPATIENT PHYSICAL THERAPY NEURO TREATMENT   Patient Name: Gina Riley MRN: [id number] DOB:[birth year], 38 y.o., female Today's Date: 11/16/2024   PCP: Ottie Dorthea KIDD, PA-C (at [facility]) REFERRING PROVIDER: Ottie Dorthea KIDD [name]  END OF SESSION:       Past Medical History:  Diagnosis Date   Cerebral palsy [facility])    Past Surgical History:  Procedure Laterality Date   rhyzotomy for CP on spine so she could walk.  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   ONSET DATE: 09/07/2024  REFERRING DIAG: G80.9 (ICD-10-CM) - Congenital cerebral palsy (HCC)  THERAPY DIAG:  No diagnosis found.  Rationale for Evaluation and Treatment: Rehabilitation  SUBJECTIVE:                                                                                                                                                                                             SUBJECTIVE STATEMENT:  Pt reports her pain is doing pretty good today, took her medications and took a shower. Pt denies any falls but reports that her L knee continues to buckle on her at times. She does bring her rollator to PT today.  ***  Pt accompanied by: self, does not drive (takes [employer] transportation, otherwise husband drives her) - husband brought her today  PERTINENT HISTORY: PMH: Cerebral palsy, Chronic pain and chronic opioid management   PAIN:  Are you having pain? Yes: NPRS scale: 7/10 Pain location: feet and knees Pain description: achy Aggravating factors: prolonged standing Relieving factors: medication, getting up and moving around  PRECAUTIONS: Fall  RED FLAGS: Bowel or bladder incontinence: Yes: pt has noted an increased frequency in her urination that started about a month ago  WEIGHT BEARING RESTRICTIONS: No  FALLS: Has patient fallen in last 6 months? Yes. Number of falls last fall was in her  living room and L knee gave out on her, able to get back up herself; 5 falls in the last 6 months  LIVING ENVIRONMENT: Lives with: lives with their family (husband, 61 yo son) Lives in: House/apartment Stairs: No; one level apartment Has following equipment at home: None  PLOF: Independent with gait and Independent with transfers  PATIENT GOALS: to get stronger to have a regimen for my exercise on the daily  OBJECTIVE:  Note: Objective measures were completed at Evaluation unless otherwise noted.  DIAGNOSTIC FINDINGS: pending B knee xrays  COGNITION: Overall cognitive status: Within functional limits for tasks assessed   SENSATION: N/T  in BLE and in her feet  EDEMA:  Gets swelling in feet L>R  MUSCLE LENGTH: Hamstrings: tight bilaterally Heel cords: tight bilaterally   POSTURE: rounded shoulders, forward head, and posterior pelvic tilt  LOWER EXTREMITY ROM:     Active  Right Eval Left Eval  Hip flexion  Tight/decreased  Hip extension    Hip abduction    Hip adduction    Hip internal rotation    Hip external rotation    Knee flexion    Knee extension Tight HS Tight HS  Ankle dorsiflexion    Ankle plantarflexion    Ankle inversion    Ankle eversion     (Blank rows = not tested)  LOWER EXTREMITY MMT:    MMT Right Eval Left Eval  Hip flexion 5 4  Hip extension    Hip abduction    Hip adduction    Hip internal rotation    Hip external rotation    Knee flexion 5 4  Knee extension 5 4  Ankle dorsiflexion 5 2-  Ankle plantarflexion    Ankle inversion    Ankle eversion    (Blank rows = not tested)  BED MOBILITY:  Mod I per patient report  TRANSFERS: Sit to stand: Modified independence  Assistive device utilized: None     Stand to sit: Modified independence  Assistive device utilized: None     Chair to chair: Modified independence  Assistive device utilized: None       Pt tends to lock out B knees prior to fully standing  RAMP:  Not  tested  CURB:  Not tested  STAIRS: Not tested GAIT: Findings:  Gait pattern: increased trunk and pelvic rotation, decreased arm swing- Right, decreased arm swing- Left, and decreased hip/knee flexion- Left Distance walked: various clinic distances Assistive device utilized: None Level of assistance: Modified independence   FUNCTIONAL TESTS:                                                                                                                                    TREATMENT DATE:  11/16/2024  NMR: To work on increased LLE WB and NMR: Wide tandem stance with LLE back performing sunshine ball toss  2 x 30 reps Romberg stance performing sunshine ball toss 2 x 30 reps Normal stance on airex performing sunshine ball toss X 30 reps Romberg stance on airex performing sunshine ball toss X 30 reps Alt L/R 6 eccentric step downs with BUE support 2 x 10 reps B Alt L/R 6 step ups with one UE support 2 x 10 reps B ***     THERACT: Assisted her with adjusting her rollator height and decreasing squeaking of her wheels. ***   PATIENT EDUCATION: Education details: continue HEP***  Person educated: Patient Education method: Explanation, Demonstration, Tactile cues, and Verbal cues Education comprehension: verbalized understanding, returned demonstration, and needs further education  HOME EXERCISE PROGRAM: Access Code: [email or web address]  URL: [email or web address] Date: 09/29/2024 Prepared by: Waddell Southgate  Exercises - Seated Hamstring Stretch  - 1 x daily - 7 x weekly - 1 sets - 3-5 reps - 30-60 sec hold - Seated Gastroc Stretch with Strap  - 1 x daily - 7 x weekly - 1 sets - 3-5 reps - 30-60 sec hold  - Supine Posterior Pelvic Tilt  - 1 x daily - 7 x weekly - 3 sets - 10 reps - Supine Lower Trunk Rotation  - 1 x daily - 7 x weekly - 1 sets - 3-5 reps - 30 sec hold - Supine Bridge  - 1 x daily - 7 x weekly - 3 sets - 10 reps - 5 sec hold - Supine March  -  1 x daily - 7 x weekly - 3 sets - 10 reps - Supine Single Knee to Chest Stretch  - 1 x daily - 7 x weekly - 1 sets - 3-5 reps - 30-60 hold - Modified Thomas Stretch  - 1 x daily - 7 x weekly - 1 sets - 3-5 reps - 30-60 sec hold -Tandem walking at counter and tandem holds - pt politely declined printout - Side Stepping with Resistance at Thighs  - 1 x daily - 7 x weekly - 3 sets - 10 reps - Forward Backward Monster Walk with Band at Thighs and Counter Support  - 1 x daily - 7 x weekly - 3 sets - 10 reps - Staggered Sit-to-Stand  - 1 x daily - 7 x weekly - 3 sets - 10 reps - Mini Squat with Counter Support  - 1 x daily - 7 x weekly - 3 sets - 10 reps   GOALS: Goals reviewed with patient? Yes  SHORT TERM GOALS: Target date: 10/21/2024   Pt will be independent with initial HEP for improved strength, balance, transfers and gait. Baseline:  pt ind and completing up to 4x/day everyday (11/11) Goal status: MET  2.  Pt will improve gait velocity to at least 3.0 ft/sec for improved gait efficiency and performance at mod I level  Baseline: 2.69 ft/sec mod I (10/15); 3.13 ft/sec no AD SBA (11/11) Goal status: IN PROGRESS  3.  Pt will improve 5 x STS to less than or equal to 30 seconds to demonstrate improved functional strength and transfer efficiency.  Baseline: 34.72 sec LE bracing against chair (10/15); 24.69 sec no UE support (11/11) Goal status: MET  4.  Pt will improve FGA score to >/=22/30 in order to demonstrate improved balance and decreased fall risk. Baseline: 18/30 (10/22); 16/30 (11/11) Goal status: NOT MET  5.  Pt will trial SPC and RW to determine if she can benefit from an AD Baseline: order for rollator (11/11) Goal status: IN PROGRESS   LONG TERM GOALS: Target date: 11/24/2024 (updated to match end of cert date)   Pt will be independent with final HEP for improved strength, balance, transfers and gait. Baseline:  Goal status: INITIAL  2.  Pt will improve gait velocity  to at least 3.25 ft/sec for improved gait efficiency and performance at mod I level  Baseline: 2.69 ft/sec mod I (10/15) Goal status: INITIAL  3.  Pt will improve 5 x STS to less than or equal to 26 seconds to demonstrate improved functional strength and transfer efficiency.  Baseline: 34.72 sec LE bracing against chair (10/15) Goal status: INITIAL  4.  Pt will improve FGA score to >/=20/30 in order to demonstrate improved balance and  decreased fall risk. Baseline: 18/30 (10/22); 16/30 (11/11) Goal status: REVISED  5.  Pt will obtain appropriate LRAD to increase her safety and independence with functional mobility and decrease her fall risk Baseline: obtained rollator (11/26) Goal status: MET    ASSESSMENT:  CLINICAL IMPRESSION: Emphasis of skilled PT session on*** continuing to work on addressing impaired strength in LLE and adjusting patient's rollator that she brings with her this session. She continues to exhibit decreased LLE strength and control as compared to her RLE. She continues to benefit from continued BLE strengthening in order to improve her gait mechanics and decrease her fall risk. Continue POC.   OBJECTIVE IMPAIRMENTS: Abnormal gait, decreased balance, decreased coordination, decreased knowledge of use of DME, decreased mobility, difficulty walking, decreased ROM, decreased strength, increased fascial restrictions, increased muscle spasms, impaired flexibility, impaired sensation, improper body mechanics, postural dysfunction, and pain.   ACTIVITY LIMITATIONS: carrying, lifting, bending, standing, squatting, stairs, transfers, bed mobility, and locomotion level  PARTICIPATION LIMITATIONS: meal prep, cleaning, laundry, driving, community activity, occupation, and church  PERSONAL FACTORS: Time since onset of injury/illness/exacerbation and 1-2 comorbidities:   Cerebral palsy, Chronic pain and chronic opioid management are also affecting patient's functional outcome.    REHAB POTENTIAL: Good  CLINICAL DECISION MAKING: Evolving/moderate complexity  EVALUATION COMPLEXITY: Moderate  PLAN:  PT FREQUENCY: 2x/week  PT DURATION: 6 weeks  PLANNED INTERVENTIONS: 97164- PT Re-evaluation, 97750- Physical Performance Testing, 97110-Therapeutic exercises, 97530- Therapeutic activity, [id number]- Neuromuscular re-education, 97535- Self Care, [id number]- Manual therapy, [id number]- Gait training, [phone]- Orthotic Initial, [phone]- Orthotic/Prosthetic subsequent, [phone]- Aquatic Therapy, [phone]- Splinting, [phone]- Electrical stimulation (manual), [phone] (1-2 muscles), [phone] (3+ muscles)- Dry Needling, Patient/Family education, Balance training, Stair training, Taping, Joint mobilization, Spinal mobilization, DME instructions, Cryotherapy, and Moist heat  PLAN FOR NEXT SESSION: Has she tried anything new at the gym? how is HEP?  Pt enjoys SciFit.  Hip strength.  Step ups. Deeper squats. Anything to increase WB or NMR on LLE (SLS, staggered stance on step, etc)***    Waddell Southgate, PT Waddell Southgate, PT, DPT, CSRS    11/16/2024, 7:35 AM

## 2024-11-19 ENCOUNTER — Encounter: Payer: Self-pay | Admitting: Physical Therapy

## 2024-11-19 ENCOUNTER — Ambulatory Visit: Attending: Physician Assistant | Admitting: Physical Therapy

## 2024-11-19 VITALS — BP 105/65 | HR 66

## 2024-11-19 DIAGNOSIS — R2681 Unsteadiness on feet: Secondary | ICD-10-CM | POA: Diagnosis present

## 2024-11-19 DIAGNOSIS — Z9181 History of falling: Secondary | ICD-10-CM | POA: Diagnosis present

## 2024-11-19 DIAGNOSIS — R2689 Other abnormalities of gait and mobility: Secondary | ICD-10-CM | POA: Insufficient documentation

## 2024-11-19 DIAGNOSIS — M6281 Muscle weakness (generalized): Secondary | ICD-10-CM | POA: Diagnosis present

## 2024-11-19 DIAGNOSIS — M79605 Pain in left leg: Secondary | ICD-10-CM | POA: Insufficient documentation

## 2024-11-19 NOTE — Therapy (Signed)
 OUTPATIENT PHYSICAL THERAPY NEURO TREATMENT   Patient Name: Gina Riley MRN: [id number] DOB:July 16, 1986, 38 y.o., female Today's Date: 11/19/2024   PCP: Gina Riley (at [facility]) REFERRING PROVIDER: Ottie Dorthea MALVA Riley  END OF SESSION:  PT End of Session - 11/19/24 1242     Visit Number 8    Number of Visits 13   with eval   Date for Recertification  11/24/24   to allow for scheduling delays   Authorization Type [employer] Dual    PT Start Time 1235    PT Stop Time 1315    PT Time Calculation (min) 40 min    Equipment Utilized During Treatment Gait belt    Activity Tolerance Patient tolerated treatment well    Behavior During Therapy WFL for tasks assessed/performed              Past Medical History:  Diagnosis Date   Cerebral palsy [facility])    Past Surgical History:  Procedure Laterality Date   rhyzotomy for CP on spine so she could walk.  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   ONSET DATE: 09/07/2024  REFERRING DIAG: G80.9 (ICD-10-CM) - Congenital cerebral palsy (HCC)  THERAPY DIAG:  Muscle weakness (generalized)  Other abnormalities of gait and mobility  Unsteadiness on feet  History of falling  Pain in left leg  Rationale for Evaluation and Treatment: Rehabilitation  SUBJECTIVE:                                                                                                                                                                                             SUBJECTIVE STATEMENT:  Pt arrives w/ rollator reporting some light headedness today.  No change to her medications or other acute changes.  She reports having scheduled an orthopedic appt for 12/8 to follow-up about surgery.    Pt accompanied by: self, does not drive (takes [employer] transportation, otherwise husband drives her) - husband brought her today  PERTINENT HISTORY:  PMH: Cerebral palsy, Chronic pain and chronic opioid management   PAIN:  Are you having pain? Yes: NPRS scale: 5/10 Pain location: feet and knees Pain description: achy Aggravating factors: prolonged standing Relieving factors: medication, getting up and moving around  PRECAUTIONS: Fall  RED FLAGS: Bowel or bladder incontinence: Yes: pt has noted an increased frequency in her urination that started about a month ago  WEIGHT BEARING RESTRICTIONS: No  FALLS: Has patient fallen in last 6 months? Yes. Number  of falls last fall was in her living room and L knee gave out on her, able to get back up herself; 5 falls in the last 6 months  LIVING ENVIRONMENT: Lives with: lives with their family (husband, 70 yo son) Lives in: House/apartment Stairs: No; one level apartment Has following equipment at home: None  PLOF: Independent with gait and Independent with transfers  PATIENT GOALS: to get stronger to have a regimen for my exercise on the daily  OBJECTIVE:  Note: Objective measures were completed at Evaluation unless otherwise noted.  DIAGNOSTIC FINDINGS: pending B knee xrays  COGNITION: Overall cognitive status: Within functional limits for tasks assessed   SENSATION: N/T in BLE and in her feet  EDEMA:  Gets swelling in feet L>R  MUSCLE LENGTH: Hamstrings: tight bilaterally Heel cords: tight bilaterally   POSTURE: rounded shoulders, forward head, and posterior pelvic tilt  LOWER EXTREMITY ROM:     Active  Right Eval Left Eval  Hip flexion  Tight/decreased  Hip extension    Hip abduction    Hip adduction    Hip internal rotation    Hip external rotation    Knee flexion    Knee extension Tight HS Tight HS  Ankle dorsiflexion    Ankle plantarflexion    Ankle inversion    Ankle eversion     (Blank rows = not tested)  LOWER EXTREMITY MMT:    MMT Right Eval Left Eval  Hip flexion 5 4  Hip extension    Hip abduction    Hip adduction    Hip internal  rotation    Hip external rotation    Knee flexion 5 4  Knee extension 5 4  Ankle dorsiflexion 5 2-  Ankle plantarflexion    Ankle inversion    Ankle eversion    (Blank rows = not tested)  BED MOBILITY:  Mod I per patient report  TRANSFERS: Sit to stand: Modified independence  Assistive device utilized: None     Stand to sit: Modified independence  Assistive device utilized: None     Chair to chair: Modified independence  Assistive device utilized: None       Pt tends to lock out B knees prior to fully standing  RAMP:  Not tested  CURB:  Not tested  STAIRS: Not tested GAIT: Findings:  Gait pattern: increased trunk and pelvic rotation, decreased arm swing- Right, decreased arm swing- Left, and decreased hip/knee flexion- Left Distance walked: various clinic distances Assistive device utilized: None Level of assistance: Modified independence   FUNCTIONAL TESTS:                                                                                                                                    TREATMENT DATE:  11/19/2024  NMR: To work on increased LLE WB and NMR: 2 offset STS x10 4 offset STS 2x10 4lb stop and roll x15 each LE in stance  w/ RUE support BUE support for cosack squats 2x8 CGA TA: SciFit in progressive peak mode x8 minutes using BUE/BLE up to level 5.0 for increased endurance challenge and large amplitude reciprocal mobility.  Achieved 11.1 average stride in inches.  PATIENT EDUCATION: Education details: continue HEP  Person educated: Patient Education method: Explanation, Demonstration, Tactile cues, and Verbal cues Education comprehension: verbalized understanding, returned demonstration, and needs further education  HOME EXERCISE PROGRAM: Access Code: [email or web address] URL: [email or web address] Date: 09/29/2024 Prepared by: Waddell Southgate  Exercises - Seated Hamstring Stretch  - 1 x daily - 7 x weekly - 1 sets - 3-5 reps - 30-60 sec  hold - Seated Gastroc Stretch with Strap  - 1 x daily - 7 x weekly - 1 sets - 3-5 reps - 30-60 sec hold  - Supine Posterior Pelvic Tilt  - 1 x daily - 7 x weekly - 3 sets - 10 reps - Supine Lower Trunk Rotation  - 1 x daily - 7 x weekly - 1 sets - 3-5 reps - 30 sec hold - Supine Bridge  - 1 x daily - 7 x weekly - 3 sets - 10 reps - 5 sec hold - Supine March  - 1 x daily - 7 x weekly - 3 sets - 10 reps - Supine Single Knee to Chest Stretch  - 1 x daily - 7 x weekly - 1 sets - 3-5 reps - 30-60 hold - Modified Thomas Stretch  - 1 x daily - 7 x weekly - 1 sets - 3-5 reps - 30-60 sec hold -Tandem walking at counter and tandem holds - pt politely declined printout - Side Stepping with Resistance at Thighs  - 1 x daily - 7 x weekly - 3 sets - 10 reps - Forward Backward Monster Walk with Band at Thighs and Counter Support  - 1 x daily - 7 x weekly - 3 sets - 10 reps - Staggered Sit-to-Stand  - 1 x daily - 7 x weekly - 3 sets - 10 reps - Mini Squat with Counter Support  - 1 x daily - 7 x weekly - 3 sets - 10 reps   GOALS: Goals reviewed with patient? Yes  SHORT TERM GOALS: Target date: 10/21/2024   Pt will be independent with initial HEP for improved strength, balance, transfers and gait. Baseline:  pt ind and completing up to 4x/day everyday (11/11) Goal status: MET  2.  Pt will improve gait velocity to at least 3.0 ft/sec for improved gait efficiency and performance at mod I level  Baseline: 2.69 ft/sec mod I (10/15); 3.13 ft/sec no AD SBA (11/11) Goal status: IN PROGRESS  3.  Pt will improve 5 x STS to less than or equal to 30 seconds to demonstrate improved functional strength and transfer efficiency.  Baseline: 34.72 sec LE bracing against chair (10/15); 24.69 sec no UE support (11/11) Goal status: MET  4.  Pt will improve FGA score to >/=22/30 in order to demonstrate improved balance and decreased fall risk. Baseline: 18/30 (10/22); 16/30 (11/11) Goal status: NOT MET  5.  Pt will  trial SPC and RW to determine if she can benefit from an AD Baseline: order for rollator (11/11) Goal status: IN PROGRESS   LONG TERM GOALS: Target date: 11/24/2024 (updated to match end of cert date)   Pt will be independent with final HEP for improved strength, balance, transfers and gait. Baseline:  Goal status: INITIAL  2.  Pt will improve gait velocity to at  least 3.25 ft/sec for improved gait efficiency and performance at mod I level  Baseline: 2.69 ft/sec mod I (10/15) Goal status: INITIAL  3.  Pt will improve 5 x STS to less than or equal to 26 seconds to demonstrate improved functional strength and transfer efficiency.  Baseline: 34.72 sec LE bracing against chair (10/15) Goal status: INITIAL  4.  Pt will improve FGA score to >/=20/30 in order to demonstrate improved balance and decreased fall risk. Baseline: 18/30 (10/22); 16/30 (11/11) Goal status: REVISED  5.  Pt will obtain appropriate LRAD to increase her safety and independence with functional mobility and decrease her fall risk Baseline: obtained rollator (11/26) Goal status: MET    ASSESSMENT:  CLINICAL IMPRESSION: Emphasis of skilled PT session on proximal mobility and increased stability on LLE.  She tolerates progression of SL tasks very well without increased pain or LOB.  She would benefit from further work to strengthen hip abductors and lengthen adductors for improved BLE alignment.  It may benefit her to work in tall and half kneeling for proximal strength and alignment in future sessions.  She sees orthopedic MD Monday 12/8 about future surgical plans and will update PT. Continue POC.   OBJECTIVE IMPAIRMENTS: Abnormal gait, decreased balance, decreased coordination, decreased knowledge of use of DME, decreased mobility, difficulty walking, decreased ROM, decreased strength, increased fascial restrictions, increased muscle spasms, impaired flexibility, impaired sensation, improper body mechanics, postural  dysfunction, and pain.   ACTIVITY LIMITATIONS: carrying, lifting, bending, standing, squatting, stairs, transfers, bed mobility, and locomotion level  PARTICIPATION LIMITATIONS: meal prep, cleaning, laundry, driving, community activity, occupation, and church  PERSONAL FACTORS: Time since onset of injury/illness/exacerbation and 1-2 comorbidities:   Cerebral palsy, Chronic pain and chronic opioid management are also affecting patient's functional outcome.   REHAB POTENTIAL: Good  CLINICAL DECISION MAKING: Evolving/moderate complexity  EVALUATION COMPLEXITY: Moderate  PLAN:  PT FREQUENCY: 2x/week  PT DURATION: 6 weeks  PLANNED INTERVENTIONS: 97164- PT Re-evaluation, 97750- Physical Performance Testing, 97110-Therapeutic exercises, 97530- Therapeutic activity, [id number]- Neuromuscular re-education, 97535- Self Care, 02859- Manual therapy, [id number]- Gait training, [phone]- Orthotic Initial, [phone]- Orthotic/Prosthetic subsequent, [phone]- Aquatic Therapy, [phone]- Splinting, [phone]- Electrical stimulation (manual), [phone] (1-2 muscles), [phone] (3+ muscles)- Dry Needling, Patient/Family education, Balance training, Stair training, Taping, Joint mobilization, Spinal mobilization, DME instructions, Cryotherapy, and Moist heat  PLAN FOR NEXT SESSION: Has she tried anything new at the gym? how is HEP?  Pt enjoys SciFit.  Hip strength.  Spanish squats.  Lateral/forward step taps to top of stairs.    Anything to increase WB or NMR on LLE (SLS, staggered stance on step, etc), tall kneel/half kneel?  Any news from ortho MD?    Daved KATHEE Bull, PT, DPT    11/19/2024, 1:13 PM

## 2024-11-22 ENCOUNTER — Ambulatory Visit: Admitting: Orthopedic Surgery

## 2024-11-22 ENCOUNTER — Encounter: Payer: Self-pay | Admitting: Orthopedic Surgery

## 2024-11-22 ENCOUNTER — Ambulatory Visit: Admitting: Physical Therapy

## 2024-11-22 DIAGNOSIS — M6702 Short Achilles tendon (acquired), left ankle: Secondary | ICD-10-CM

## 2024-11-22 NOTE — Progress Notes (Signed)
 Office Visit Note   Patient: Gina Riley           Date of Birth: 06-27-1986           MRN: [id number] Visit Date: 11/22/2024              Requested by: Ottie Dorthea KIDD, PA-C [address] [place],  [place] [address] PCP: Ottie Dorthea KIDD, PA-C  Chief Complaint  Patient presents with   Lower Back - Follow-up      HPI: Discussed the use of AI scribe software for clinical note transcription with the patient, who gave verbal consent to proceed.  History of Present Illness Gina Riley is a 38 year old female who presents for follow-up of bilateral Achilles contractures.  She experiences pain in the left forefoot and has been attending physical therapy, having completed eight out of thirteen sessions.  She is currently taking gabapentin 100 mg three times a day.     Assessment & Plan: Visit Diagnoses:  1. Achilles tendon contracture, bilateral     Plan: Assessment and Plan Assessment & Plan Bilateral Achilles tendon contractures Increased tightness on the left with valgus alignment and crouched gait. Dorsiflexion just short of neutral on the left, slightly past neutral on the right. Progress with physical therapy. - Continue physical therapy and stretching exercises. - Wean off gabapentin as Achilles stretching improves.  Left forefoot pain Pain with foot strike over the left forefoot. Progress with physical therapy. - Continue physical therapy. - Advised to call if symptoms increase.      Follow-Up Instructions: Return if symptoms worsen or fail to improve.   Ortho Exam  Patient is alert, oriented, no adenopathy, well-dressed, normal affect, normal respiratory effort. Physical Exam MUSCULOSKELETAL: Valgus alignment with flexed knee. Crouched gait with increased tightness on left. Foot strike over forefoot on left. Dorsiflexion short of neutral on left, past neutral on right. Increased Achilles contraction on left compared to right.       Imaging: No results found. No images are attached to the encounter.  Labs: Lab Results  Component Value Date   REPTSTATUS 09/11/2019 FINAL 09/09/2019   CULT >=100,000 COLONIES/mL ESCHERICHIA COLI (A) 09/09/2019   LABORGA ESCHERICHIA COLI (A) 09/09/2019     Lab Results  Component Value Date   ALBUMIN 3.9 05/20/2016   ALBUMIN 3.8 02/20/2015   ALBUMIN 3.6 12/13/2012    No results found for: MG No results found for: VD25OH  No results found for: PREALBUMIN    Latest Ref Rng & Units 01/23/2022   11:27 AM 05/20/2016   12:00 PM 02/20/2015    8:23 AM  CBC EXTENDED  WBC 4.0 - 10.5 K/uL 4.7  6.0  3.9   RBC 3.87 - 5.11 MIL/uL 3.89  4.05  4.06   Hemoglobin 12.0 - 15.0 g/dL 87.6  87.5  87.6   HCT 36.0 - 46.0 % 36.5  36.2  36.7   Platelets 150 - 400 K/uL 158  169  153   NEUT# 1.7 - 7.7 K/uL   1.8   Lymph# 0.7 - 4.0 K/uL   1.6      There is no height or weight on file to calculate BMI.  Orders:  No orders of the defined types were placed in this encounter.  No orders of the defined types were placed in this encounter.    Procedures: No procedures performed  Clinical Data: No additional findings.  ROS:  All other systems negative, except as noted in  the HPI. Review of Systems  Objective: Vital Signs: There were no vitals taken for this visit.  Specialty Comments:  No specialty comments available.  PMFS History: There are no active problems to display for this patient.  Past Medical History:  Diagnosis Date   Cerebral palsy (HCC)     Family History  Problem Relation Age of Onset   Vision loss Father     Past Surgical History:  Procedure Laterality Date   rhyzotomy for CP on spine so she could walk.  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

## 2024-11-23 ENCOUNTER — Ambulatory Visit: Admitting: Obstetrics and Gynecology

## 2024-11-23 DIAGNOSIS — R87619 Unspecified abnormal cytological findings in specimens from cervix uteri: Secondary | ICD-10-CM | POA: Insufficient documentation

## 2024-11-23 NOTE — Telephone Encounter (Signed)
 [facility]  Established Patient Screening Note  Gina Riley is a 38 y.o. old female presenting for return patient visit.  Pain Description: Since the last visit the pain is unchanged. The most significant location of pain is the left foot. Other areas of pain include the lower back, bilateral shoulders hips and knee The 1-2 words that best describe the pain: aching, burning, stinging, and constant and comes and goes. The pain improves with prescription pain medications and interventional procedures. The pain is made worse with most movements and general daily activities. The average daily pain score is 8/10. The pain can be as high as 10/10 at its worst. The pain interferes with: All activities, Walking, and Sleeping.  Therapies: Did the patient have an injection/procedure since the last visit? Yes  - Percentage of pain relief from injection/procedure: 90%  Has the patient had any new imaging (X-ray, MRI, CT) for pain concerns since the last visit? No  Has physical therapy been initiated or completed for this pain concern? No  Medications: Was a new medication for pain started by our clinic at the last visit? No  Is the patient on a blood thinner (including aspirin, Goody/BC/Bayer powder)? No

## 2024-11-23 NOTE — Progress Notes (Signed)
 [facility] Established Patient Visit   Referring Doctor: No ref. provider found Primary Doctor: No primary care provider on file. Date of Service: 11/23/2024  Being seen by Sherlean New, PA-C today in [facility]. _______________________________________________________________________________________________________  Chief Complaint  Patient presents with  . Follow-up  . Back Pain    lower  . Knee Pain    bilateral  . Hip Pain    right  . Shoulder Pain    bilateral   _______________________________________________________________________________________________________  History of Present Illness: Gina Riley is a 38 y.o. female.  Patient denies any major changes in their health since the last visit.   Pain Description: Since the last visit the pain is improved in bilateral knees and lower extremities, but unchanged in her feet. The most significant location of pain is the left foot. Other areas of pain include the low back, bilateral hips, and bilateral knees.  The 1-2 words that best describe the pain: aching, burning, stinging, and intermittent.  The pain improves with prescription medications and interventional procedures. The pain is made worse with most movements and general daily activities. The average daily pain score is an 8/10. The pain can be as high as a 10/10 at its worst.  The pain interferes with: All activities, Walking, and Sleeping.   Therapies: Did the patient have an injection/procedure since the last visit? Yes: Bilateral IA knee injections on 10/11/24 by Dr. Catherene             - Percentage of pain relief from injection/procedure: 90%   Has the patient had any new imaging (X-ray, MRI, CT) for pain concerns since the last visit?  - Yes: X-ray Lumbar Spine and Bilateral Knees on 10/01/24   Has physical therapy been initiated or completed for this pain concern? No    Medications: Was a new medication for pain started by our clinic at the last visit? No  Is the patient on a blood thinner (including aspirin, Goody/BC/Bayer powder)? No _______________________________________________________________________________________________________  Historical Information:  Medical History[1]   Surgical History[2]  Family History[3]  Social History   Tobacco Use  . Smoking status: Every Day    Current packs/day: 0.25    Types: Cigarettes  . Smokeless tobacco: Never  Substance Use Topics  . Alcohol use: No   Reviewed: Yes _______________________________________________________________________________________________________  Allergies: Codeine, Penicillins, and Penicillin  Current Medications[4] _______________________________________________________________________________________________________  Review of Systems: Reviewed by Sherlean Jenkins New, PA-C ROS was performed with positive/negative pertinent findings listed in HPI.  _______________________________________________________________________________________________________  Physical Exam:  Vitals:   11/23/24 0941  BP: 127/82  BP Location: Left arm  Patient Position: Sitting  Pulse: 66  Resp: 18  Temp: 98.7 F (37.1 C)  TempSrc: Temporal  SpO2: 99%  Weight: 54.9 kg (121 lb)   Pain Assessment Pain Score  : 6  What number best describes your pain on average in the past week?: 8 What number best describes how, during the past week, pain has interfered with your enjoyment of life?: 10 - Completely interferes What number best describes how, during the past week, pain has interfered with your general activity?: 10 - Completely interferes PEG Pain Total Score: 9.33  General: Constitutional: Well developed, Well nourished, No acute distress and Interactive General: Patient is alert and orientated Psychological: The patient's mood is normal, and appropriate for the  circumstances Gait:   Antalgic. Uses assist device - none  _______________________________________________________________________________________________________ CTE Measures: Is the patient taking prescription  opioids? No _______________________________________________________________________________________________________ Imaging and Diagnostic Studies:  All applicable diagnostic studies related to this consultation have been reviewed. Relevant diagnostic reports and/or my personal review of imaging or other diagnostic studies listed below:   EXAM: XR KNEE 1-2 VIEWS RIGHT, 10/01/2024 1:06 PM    INDICATION: Pain in right knee COMPARISON: None.   IMPRESSION: 1.  No acute fracture or dislocation. 2.  Patella alta with traction enthesopathy and distal pole of the patella and tibial tubercle enthesopathy 3.  No joint effusion. 4.  Minimal patellofemoral compartment degenerative changes.   I have personally reviewed the procedure note and/or have reviewed and interpreted this image/images. Electronically Signed By: Roena Hy, MD on 10/01/2024  5:40 PM   EXAM: XR KNEE 1-2 VIEWS LEFT, 10/01/2024 1:05 PM    INDICATION: Pain in right knee COMPARISON: None.   IMPRESSION: 1.  No acute fracture or dislocation. 2.  Patella alta with traction enthesopathy in distal pole of the patella and tibial tubercle enthesopathy 3.  No joint effusion. 4.  Minimal patellofemoral compartment degenerative changes.   I have personally reviewed the procedure note and/or have reviewed and interpreted this image/images. Electronically Signed By: Roena Hy, MD on 10/01/2024  5:42 PM   EXAM: XR SPINE LUMBAR COMPLETE 4+ VIEWS, 10/01/2024 1:05 PM   INDICATION: Spondylosis without myelopathy or radiculopathy, lumbar region \ M47.816 Spondylosis without myelopathy or radiculopathy, lumbar region    COMPARISON: None   IMPRESSION: No acute compression deformity or traumatic malalignment. Diminutive or  absent posterior elements at L1-L4 may be developmental or related to prior surgery. CT or MRI can further assess if clinically warranted. Mild degenerative disc disease L4-S1.   I have personally reviewed the procedure note and/or have reviewed and interpreted this image/images. Electronically Signed By: Roena Hy, MD on 10/02/2024  2:40 PM   I personally reviewed imaging findings _______________________________________________________________________________________________________  Relevant Labs: No results found for: CREATININE No results found for: WBC, HGB, HCT, MCV, PLT No results found for: HGBA1C  _______________________________________________________________________________________________________ Diagnosis/Impression:   Chronic pain of both knees  Spondylosis without myelopathy or radiculopathy, lumbar region    A/P 11/23/2024: Ms. Gina Riley is a 38 y.o. female who presents to the clinic for continued management of chronic pain.  She has a PMH significant for tobacco use, cerebral palsy, chronic low back pain, chronic bilateral knee pain, and chronic bilateral foot pain.  Today, she presents to the clinic after completing bilateral intra-articular knee injections on 10/11/24 by Dr. Catherene.  She reports approximately 90% alleviation of her bilateral knee pain post procedurally.  At this time, she does report overall improvement of her symptoms after completing this interventional procedure and continue conservative measures including PT and HEP.  Her primary pain generator at this time is reported to be pain in her feet, worse on the left than the right.  She continues to follow-up with Orthopedic Surgery and Podiatry for her ongoing pain in bilateral feet.  She states that at her most recent office visit there was some noted improvement after continuation of PT and HEP.  She hopes to continue to complete conservative measures in hopes of avoiding  surgical intervention.  She is encouraged to continue conservative measures and continue to follow-up with Orthopedic Surgery and Podiatry as scheduled/needed.  In regards to medication management, she continues with the use of Gabapentin and Baclofen , as prescribed by outside providers.  She does report some improvement of her symptoms with use of both of these medications.  Also noting that  at her initial consult with Dr. Catherene on 09/23/24 a referral was placed to Dr. Elnor for thorough review of her medication regimen.  She states that she was initially contacted by Dr. Kevin office, but an initial consult was not scheduled.  She was encouraged to schedule this consult for further evaluation and discussion regarding comprehensive management.  Otherwise, she will plan to follow-up with our practice in 2 months for further evaluation.  Although, she is aware that she may call for sooner appointment if her symptoms increase in severity or frequency prior to that time.  Recommended Plan of Care: - Interventional Procedures: - Prior: Bilateral Intra-articular knee injections on 10/11/2024 by Dr. Catherene - 90% alleviation  - None at this time due to improved pain - Medical Modalities:  - Medication management per outside providers  - Imaging/Labs:  - None - Consults/Therapy:  - Continue: Physical Therapy and HEP  - Follow-up with Dr. Elnor (Referral placed at initial consult with Dr. Catherene on 09/23/24 for options regarding comprehensive medication management)  - Follow-up with Orthopedic Surgery and Podiatry (foot/ankle pain) as scheduled/needed - Follow up: - Return in about 2 months (around 01/24/2025).  Time (>20 minutes) was spent on reviewing patient PMH, medications, procedures and image, performing medically appropriate examination, and counseling and educating patient.  Treatment plan fully discussed and agreed upon with patient. All questions were answered. Documentation on day of  service.  Electronically signed by: Sherlean Jenkins New, PA-C 11/23/2024 10:09 AM       [1] Past Medical History: Diagnosis Date  . Cerebral palsy (CMD)   [2] History reviewed. No pertinent surgical history. [3] Family History Problem Relation Name Age of Onset  . Cancer Maternal Aunt         Breast  . Cancer Paternal Aunt         Throat  . Cancer Maternal Aunt         Throat  [4] Current Outpatient Medications  Medication Sig Dispense Refill  . albuterol  HFA (PROVENTIL  HFA;VENTOLIN  HFA;PROAIR  HFA) 90 mcg/actuation inhaler Inhale 2 puffs.    . baclofen  (LIORESAL ) 10 mg tablet Take 10 mg by mouth.    . gabapentin (NEURONTIN) 100 mg capsule Take 100 mg by mouth 3 (three) times a day as needed.    . orphenadrine  (NORFLEX ) 100 mg tablet Take 100 mg by mouth.    . Narcan 4 mg/actuation spry nasal spray  (Patient not taking: Reported on 11/23/2024)  0   No current facility-administered medications for this visit.

## 2024-11-23 NOTE — Progress Notes (Deleted)
   ANNUAL EXAM Patient name: Gina Riley MRN [id number]  Date of birth: [birth year] Chief Complaint:   No chief complaint on file.  History of Present Illness:   Gina Riley is a 38 y.o. G66P1011 female being seen today for a routine annual exam.   Current concerns: ***  Current birth control: ***  No LMP recorded.  Gardasil: *** Last Pap/Pap History:  12/2022: Normal pap, HPV pos 16 10/2023: ASCUS/HPV pos, 16 and other HR HPV 12/24/2023: LEEP, CIN3 negative margins  Review of Systems:   Pertinent items are noted in HPI Denies any headaches, blurred vision, fatigue, shortness of breath, chest pain, abdominal pain, abnormal vaginal discharge/itching/odor/irritation, problems with periods, bowel movements, urination, or intercourse unless otherwise stated above. *** Pertinent History Reviewed:  Reviewed past medical,surgical, social and family history.  Reviewed problem list, medications and allergies. Physical Assessment:  There were no vitals filed for this visit.There is no height or weight on file to calculate BMI.   Physical Examination:  General appearance - well appearing, and in no distress Mental status - alert, oriented to person, place, and time Psych:  She has a normal mood and affect Skin - warm and dry, normal color, no suspicious lesions noted Chest - effort normal Heart - normal rate  Breasts - breasts appear normal, no suspicious masses, no skin or nipple changes or axillary nodes Abdomen - soft, nontender, nondistended, no masses or organomegaly Pelvic -  {Blank single:19197::Performed and:,declines,not indicated} VULVA: {Blank single:19197::Not examined,normal appearing vulva with no masses, tenderness or lesions,***} VAGINA: {Blank single:19197::Not examined,normal appearing vagina with normal color and discharge, no lesions,***} CERVIX: {Blank single:19197::Not examined,normal appearing cervix without discharge or lesions, no  CMT.,***} UTERUS: {Blank single:19197::Not examined,uterus is felt to be normal size, shape, consistency and nontender,***} ADNEXA: {Blank single:19197::Not examined,No adnexal masses or tenderness noted,***} Extremities:  No swelling or varicosities noted  Chaperone present for exam  No results found for this or any previous visit (from the past 24 hours).  Assessment & Plan:  Diagnoses and all orders for this visit:  Encounter for annual routine gynecological examination  Abnormal cervical Papanicolaou smear, unspecified abnormal pap finding      No orders of the defined types were placed in this encounter.   Meds: No orders of the defined types were placed in this encounter.   Follow-up: No follow-ups on file.  Vina Solian, MD 11/23/2024 10:00 AM

## 2024-11-24 ENCOUNTER — Ambulatory Visit: Payer: Self-pay | Admitting: Physical Therapy

## 2024-11-24 NOTE — Therapy (Incomplete)
 OUTPATIENT PHYSICAL THERAPY NEURO TREATMENT - RECERT***   Patient Name: Gina Riley MRN: [id number] DOB:Jun 20, 1986, 38 y.o., female Today's Date: 11/24/2024   PCP: Ottie Dorthea KIDD, PA-C (at [facility]) REFERRING PROVIDER: Ottie Dorthea KIDD [name]  END OF SESSION:        Past Medical History:  Diagnosis Date   Cerebral palsy [facility])    Past Surgical History:  Procedure Laterality Date   rhyzotomy for CP on spine so she could walk.  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  Rationale for Evaluation and Treatment: Rehabilitation  SUBJECTIVE:                                                                                                                                                                                             SUBJECTIVE STATEMENT:  Pt arrives w/ rollator reporting some light headedness today.  No change to her medications or other acute changes.  She reports having scheduled an orthopedic appt for 12/8 to follow-up about surgery.    ***  Pt accompanied by: self, does not drive (takes [employer] transportation, otherwise husband drives her) - husband brought her today  PERTINENT HISTORY: PMH: Cerebral palsy, Chronic pain and chronic opioid management   PAIN:  Are you having pain? Yes: NPRS scale: 5/10 Pain location: feet and knees Pain description: achy Aggravating factors: prolonged standing Relieving factors: medication, getting up and moving around  PRECAUTIONS: Fall  RED FLAGS: Bowel or bladder incontinence: Yes: pt has noted an increased frequency in her urination that started about a month ago  WEIGHT BEARING RESTRICTIONS: No  FALLS: Has patient fallen in last 6  months? Yes. Number of falls last fall was in her living room and L knee gave out on her, able to get back up herself; 5 falls in the last 6 months  LIVING ENVIRONMENT: Lives with: lives with their family (husband, 35 yo son) Lives in: House/apartment Stairs: No; one level apartment Has following equipment at home: None  PLOF: Independent with gait and Independent with transfers  PATIENT GOALS: to get stronger to have a regimen for my exercise on the daily  OBJECTIVE:  Note: Objective measures were completed at Evaluation unless otherwise noted.  DIAGNOSTIC FINDINGS: pending B knee xrays  COGNITION: Overall cognitive status: Within functional limits for tasks assessed  SENSATION: N/T in BLE and in her feet  EDEMA:  Gets swelling in feet L>R  MUSCLE LENGTH: Hamstrings: tight bilaterally Heel cords: tight bilaterally   POSTURE: rounded shoulders, forward head, and posterior pelvic tilt  LOWER EXTREMITY ROM:     Active  Right Eval Left Eval  Hip flexion  Tight/decreased  Hip extension    Hip abduction    Hip adduction    Hip internal rotation    Hip external rotation    Knee flexion    Knee extension Tight HS Tight HS  Ankle dorsiflexion    Ankle plantarflexion    Ankle inversion    Ankle eversion     (Blank rows = not tested)  LOWER EXTREMITY MMT:    MMT Right Eval Left Eval  Hip flexion 5 4  Hip extension    Hip abduction    Hip adduction    Hip internal rotation    Hip external rotation    Knee flexion 5 4  Knee extension 5 4  Ankle dorsiflexion 5 2-  Ankle plantarflexion    Ankle inversion    Ankle eversion    (Blank rows = not tested)  BED MOBILITY:  Mod I per patient report  TRANSFERS: Sit to stand: Modified independence  Assistive device utilized: None     Stand to sit: Modified independence  Assistive device utilized: None     Chair to chair: Modified independence  Assistive device utilized: None       Pt tends to lock out B  knees prior to fully standing  RAMP:  Not tested  CURB:  Not tested  STAIRS: Not tested GAIT: Findings:  Gait pattern: increased trunk and pelvic rotation, decreased arm swing- Right, decreased arm swing- Left, and decreased hip/knee flexion- Left Distance walked: various clinic distances Assistive device utilized: None Level of assistance: Modified independence   FUNCTIONAL TESTS:                                                                                                                                    TREATMENT DATE:  11/24/2024  NMR: To work on increased LLE WB and NMR: 2 offset STS x10 4 offset STS 2x10 4lb stop and roll x15 each LE in stance w/ RUE support BUE support for cosack squats 2x8 CGA ***  TA: SciFit in progressive peak mode x8 minutes using BUE/BLE up to level 5.0 for increased endurance challenge and large amplitude reciprocal mobility.  Achieved 11.1 average stride in inches. ***  PATIENT EDUCATION: Education details: continue HEP***  Person educated: Patient Education method: Explanation, Demonstration, Tactile cues, and Verbal cues Education comprehension: verbalized understanding, returned demonstration, and needs further education  HOME EXERCISE PROGRAM: Access Code: [email or web address] URL: [email or web address] Date: 09/29/2024 Prepared by: Waddell Southgate  Exercises - Seated Hamstring Stretch  - 1 x daily - 7 x weekly - 1 sets - 3-5 reps - 30-60 sec hold -  Seated Gastroc Stretch with Strap  - 1 x daily - 7 x weekly - 1 sets - 3-5 reps - 30-60 sec hold  - Supine Posterior Pelvic Tilt  - 1 x daily - 7 x weekly - 3 sets - 10 reps - Supine Lower Trunk Rotation  - 1 x daily - 7 x weekly - 1 sets - 3-5 reps - 30 sec hold - Supine Bridge  - 1 x daily - 7 x weekly - 3 sets - 10 reps - 5 sec hold - Supine March  - 1 x daily - 7 x weekly - 3 sets - 10 reps - Supine Single Knee to Chest Stretch  - 1 x daily - 7 x weekly - 1 sets -  3-5 reps - 30-60 hold - Modified Thomas Stretch  - 1 x daily - 7 x weekly - 1 sets - 3-5 reps - 30-60 sec hold -Tandem walking at counter and tandem holds - pt politely declined printout - Side Stepping with Resistance at Thighs  - 1 x daily - 7 x weekly - 3 sets - 10 reps - Forward Backward Monster Walk with Band at Thighs and Counter Support  - 1 x daily - 7 x weekly - 3 sets - 10 reps - Staggered Sit-to-Stand  - 1 x daily - 7 x weekly - 3 sets - 10 reps - Mini Squat with Counter Support  - 1 x daily - 7 x weekly - 3 sets - 10 reps   GOALS: Goals reviewed with patient? Yes  SHORT TERM GOALS: Target date: 10/21/2024   Pt will be independent with initial HEP for improved strength, balance, transfers and gait. Baseline:  pt ind and completing up to 4x/day everyday (11/11) Goal status: MET  2.  Pt will improve gait velocity to at least 3.0 ft/sec for improved gait efficiency and performance at mod I level  Baseline: 2.69 ft/sec mod I (10/15); 3.13 ft/sec no AD SBA (11/11) Goal status: IN PROGRESS  3.  Pt will improve 5 x STS to less than or equal to 30 seconds to demonstrate improved functional strength and transfer efficiency.  Baseline: 34.72 sec LE bracing against chair (10/15); 24.69 sec no UE support (11/11) Goal status: MET  4.  Pt will improve FGA score to >/=22/30 in order to demonstrate improved balance and decreased fall risk. Baseline: 18/30 (10/22); 16/30 (11/11) Goal status: NOT MET  5.  Pt will trial SPC and RW to determine if she can benefit from an AD Baseline: order for rollator (11/11) Goal status: IN PROGRESS   LONG TERM GOALS: Target date: 11/24/2024 (updated to match end of cert date)***   Pt will be independent with final HEP for improved strength, balance, transfers and gait. Baseline:  Goal status: INITIAL  2.  Pt will improve gait velocity to at least 3.25 ft/sec for improved gait efficiency and performance at mod I level  Baseline: 2.69 ft/sec mod I  (10/15) Goal status: INITIAL  3.  Pt will improve 5 x STS to less than or equal to 26 seconds to demonstrate improved functional strength and transfer efficiency.  Baseline: 34.72 sec LE bracing against chair (10/15) Goal status: INITIAL  4.  Pt will improve FGA score to >/=20/30 in order to demonstrate improved balance and decreased fall risk. Baseline: 18/30 (10/22); 16/30 (11/11) Goal status: REVISED  5.  Pt will obtain appropriate LRAD to increase her safety and independence with functional mobility and decrease her fall risk Baseline:  obtained rollator (11/26) Goal status: MET   NEW SHORT TERM GOALS:   Target date: {follow up:25551}  *** Baseline: *** Goal status: {GOALSTATUS:25110}  2.  *** Baseline: *** Goal status: {GOALSTATUS:25110}  3.  *** Baseline: *** Goal status: {GOALSTATUS:25110}  4.  *** Baseline: *** Goal status: {GOALSTATUS:25110}  5.  *** Baseline: *** Goal status: {GOALSTATUS:25110}  6.  *** Baseline: *** Goal status: {GOALSTATUS:25110}  NEW LONG TERM GOALS:  Target date: {follow up:25551}  *** Baseline: *** Goal status: {GOALSTATUS:25110}  2.  *** Baseline: *** Goal status: {GOALSTATUS:25110}  3.  *** Baseline: *** Goal status: {GOALSTATUS:25110}  4.  *** Baseline: *** Goal status: {GOALSTATUS:25110}  5.  *** Baseline: *** Goal status: {GOALSTATUS:25110}  6.  *** Baseline: *** Goal status: {GOALSTATUS:25110}     ASSESSMENT:  CLINICAL IMPRESSION: Emphasis of skilled PT session on*** proximal mobility and increased stability on LLE.  She tolerates progression of SL tasks very well without increased pain or LOB.  She would benefit from further work to strengthen hip abductors and lengthen adductors for improved BLE alignment.  It may benefit her to work in tall and half kneeling for proximal strength and alignment in future sessions.  She sees orthopedic MD Monday 12/8 about future surgical plans and will update PT. Continue  POC.   OBJECTIVE IMPAIRMENTS: Abnormal gait, decreased balance, decreased coordination, decreased knowledge of use of DME, decreased mobility, difficulty walking, decreased ROM, decreased strength, increased fascial restrictions, increased muscle spasms, impaired flexibility, impaired sensation, improper body mechanics, postural dysfunction, and pain.   ACTIVITY LIMITATIONS: carrying, lifting, bending, standing, squatting, stairs, transfers, bed mobility, and locomotion level  PARTICIPATION LIMITATIONS: meal prep, cleaning, laundry, driving, community activity, occupation, and church  PERSONAL FACTORS: Time since onset of injury/illness/exacerbation and 1-2 comorbidities:   Cerebral palsy, Chronic pain and chronic opioid management are also affecting patient's functional outcome.   REHAB POTENTIAL: Good  CLINICAL DECISION MAKING: Evolving/moderate complexity  EVALUATION COMPLEXITY: Moderate  PLAN:  PT FREQUENCY: 2x/week  PT DURATION: 6 weeks  PLANNED INTERVENTIONS: 97164- PT Re-evaluation, 97750- Physical Performance Testing, 97110-Therapeutic exercises, 97530- Therapeutic activity, [id number]- Neuromuscular re-education, 97535- Self Care, [id number]- Manual therapy, [id number]- Gait training, [phone]- Orthotic Initial, [phone]- Orthotic/Prosthetic subsequent, [phone]- Aquatic Therapy, [phone]- Splinting, [phone]- Electrical stimulation (manual), [phone] (1-2 muscles), [id number] (3+ muscles)- Dry Needling, Patient/Family education, Balance training, Stair training, Taping, Joint mobilization, Spinal mobilization, DME instructions, Cryotherapy, and Moist heat  PLAN FOR NEXT SESSION: Has she tried anything new at the gym? how is HEP?  Pt enjoys SciFit.  Hip strength.  Spanish squats.  Lateral/forward step taps to top of stairs.    Anything to increase WB or NMR on LLE (SLS, staggered stance on step, etc), tall kneel/half kneel?  Any news from ortho MD?***    Waddell Southgate, PT Waddell Southgate, PT, DPT,  CSRS     11/24/2024, 7:39 AM

## 2024-11-29 ENCOUNTER — Ambulatory Visit: Payer: Self-pay | Admitting: Physical Therapy

## 2024-11-29 VITALS — BP 127/84 | HR 67

## 2024-11-29 DIAGNOSIS — M79605 Pain in left leg: Secondary | ICD-10-CM

## 2024-11-29 DIAGNOSIS — Z9181 History of falling: Secondary | ICD-10-CM

## 2024-11-29 DIAGNOSIS — M6281 Muscle weakness (generalized): Secondary | ICD-10-CM | POA: Diagnosis not present

## 2024-11-29 DIAGNOSIS — R2689 Other abnormalities of gait and mobility: Secondary | ICD-10-CM

## 2024-11-29 DIAGNOSIS — R2681 Unsteadiness on feet: Secondary | ICD-10-CM

## 2024-11-29 NOTE — Therapy (Signed)
 OUTPATIENT PHYSICAL THERAPY NEURO TREATMENT - RECERT   Patient Name: Gina Riley MRN: [id number] DOB:1986-04-24, 38 y.o., female Today's Date: 11/29/2024   PCP: Ottie Dorthea [name] (at [facility]) REFERRING PROVIDER: Ottie Dorthea MALVA, PA-C  END OF SESSION:  PT End of Session - 11/29/24 1106     Visit Number 9    Number of Visits 14   recert   Date for Recertification  12/27/24   recert, to allow for scheduling delays   Authorization Type [employer] Dual    PT Start Time 1105   pt arrived late   PT Stop Time 1144    PT Time Calculation (min) 39 min    Equipment Utilized During Treatment Gait belt    Activity Tolerance Patient tolerated treatment well    Behavior During Therapy WFL for tasks assessed/performed               Past Medical History:  Diagnosis Date   Cerebral palsy (HCC)    Past Surgical History:  Procedure Laterality Date   rhyzotomy for CP on spine so she could walk.  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   Patient Active Problem List   Diagnosis Date Noted   Abnormal cervical Papanicolaou smear 11/23/2024    ONSET DATE: 09/07/2024  REFERRING DIAG: G80.9 (ICD-10-CM) - Congenital cerebral palsy (HCC)  THERAPY DIAG:  Muscle weakness (generalized)  Other abnormalities of gait and mobility  Unsteadiness on feet  History of falling  Pain in left leg  Rationale for Evaluation and Treatment: Rehabilitation  SUBJECTIVE:                                                                                                                                                                                             SUBJECTIVE STATEMENT:  Pt apologizes that she missed some visits due to dealing with family issues, she is stressed right now. Her BP was low during last PT visit but was high at the doctor, they told her to monitor her stress levels. Pt has also been feeling dizzy at times. She is still waiting to get  a referral to a pain management clinic. She has also seen her orthopedic doctor since last visit, he is no longer recommending surgery as she is making progress with therapy.  She continues to work on her HEP at home. She wants to continue to focus on LLE strengthening and balance.  Pt accompanied by: self, does not drive (takes [employer] transportation, otherwise husband drives her) - husband brought her today  PERTINENT  HISTORY: PMH: Cerebral palsy, Chronic pain and chronic opioid management   PAIN:  Are you having pain? Yes: NPRS scale: 5/10 Pain location: feet and knees Pain description: achy Aggravating factors: prolonged standing Relieving factors: medication, getting up and moving around  PRECAUTIONS: Fall  RED FLAGS: Bowel or bladder incontinence: Yes: pt has noted an increased frequency in her urination that started about a month ago  WEIGHT BEARING RESTRICTIONS: No  FALLS: Has patient fallen in last 6 months? Yes. Number of falls last fall was in her living room and L knee gave out on her, able to get back up herself; 5 falls in the last 6 months  LIVING ENVIRONMENT: Lives with: lives with their family (husband, 3 yo son) Lives in: House/apartment Stairs: No; one level apartment Has following equipment at home: None  PLOF: Independent with gait and Independent with transfers  PATIENT GOALS: to get stronger to have a regimen for my exercise on the daily  OBJECTIVE:  Note: Objective measures were completed at Evaluation unless otherwise noted.  DIAGNOSTIC FINDINGS: pending B knee xrays  COGNITION: Overall cognitive status: Within functional limits for tasks assessed   SENSATION: N/T in BLE and in her feet  EDEMA:  Gets swelling in feet L>R  MUSCLE LENGTH: Hamstrings: tight bilaterally Heel cords: tight bilaterally   POSTURE: rounded shoulders, forward head, and posterior pelvic tilt  LOWER EXTREMITY ROM:     Active  Right Eval Left Eval  Hip  flexion  Tight/decreased  Hip extension    Hip abduction    Hip adduction    Hip internal rotation    Hip external rotation    Knee flexion    Knee extension Tight HS Tight HS  Ankle dorsiflexion    Ankle plantarflexion    Ankle inversion    Ankle eversion     (Blank rows = not tested)  LOWER EXTREMITY MMT:    MMT Right Eval Left Eval  Hip flexion 5 4  Hip extension    Hip abduction    Hip adduction    Hip internal rotation    Hip external rotation    Knee flexion 5 4  Knee extension 5 4  Ankle dorsiflexion 5 2-  Ankle plantarflexion    Ankle inversion    Ankle eversion    (Blank rows = not tested)  BED MOBILITY:  Mod I per patient report  TRANSFERS: Sit to stand: Modified independence  Assistive device utilized: None     Stand to sit: Modified independence  Assistive device utilized: None     Chair to chair: Modified independence  Assistive device utilized: None       Pt tends to lock out B knees prior to fully standing  RAMP:  Not tested  CURB:  Not tested  STAIRS: Not tested GAIT: Findings:  Gait pattern: increased trunk and pelvic rotation, decreased arm swing- Right, decreased arm swing- Left, and decreased hip/knee flexion- Left Distance walked: various clinic distances Assistive device utilized: None Level of assistance: Modified independence   FUNCTIONAL TESTS:    [facility] PT Assessment - 11/29/24 1118       Ambulation/Gait   Gait velocity 32.8 ft over 12.32 sec = 2.66 ft/sec      Standardized Balance Assessment   Standardized Balance Assessment Five Times Sit to Stand    Five times sit to stand comments  24.56 sec   no UE     Functional Gait  Assessment   Gait assessed  Yes  Gait Level Surface Walks 20 ft in less than 7 sec but greater than 5.5 sec, uses assistive device, slower speed, mild gait deviations, or deviates 6-10 in outside of the 12 in walkway width.    Change in Gait Speed Makes only minor adjustments to walking speed, or  accomplishes a change in speed with significant gait deviations, deviates 10-15 in outside the 12 in walkway width, or changes speed but loses balance but is able to recover and continue walking.    Gait with Horizontal Head Turns Performs head turns with moderate changes in gait velocity, slows down, deviates 10-15 in outside 12 in walkway width but recovers, can continue to walk.    Gait with Vertical Head Turns Performs task with slight change in gait velocity (eg, minor disruption to smooth gait path), deviates 6 - 10 in outside 12 in walkway width or uses assistive device    Gait and Pivot Turn Pivot turns safely in greater than 3 sec and stops with no loss of balance, or pivot turns safely within 3 sec and stops with mild imbalance, requires small steps to catch balance.    Step Over Obstacle Is able to step over one shoe box (4.5 in total height) but must slow down and adjust steps to clear box safely. May require verbal cueing.    Gait with Narrow Base of Support Ambulates less than 4 steps heel to toe or cannot perform without assistance.    Gait with Eyes Closed Walks 20 ft, uses assistive device, slower speed, mild gait deviations, deviates 6-10 in outside 12 in walkway width. Ambulates 20 ft in less than 9 sec but greater than 7 sec.    Ambulating Backwards Walks 20 ft, uses assistive device, slower speed, mild gait deviations, deviates 6-10 in outside 12 in walkway width.    Steps Alternating feet, must use rail.    Total Score 15    FGA comment: 15/30, high fall risk                                                                                                                                         TREATMENT DATE:  11/29/2024  TherAct: Discussed back pillow for support and step stool under feet while seated on couch To work on BLE strengthening: Alt L/R resisted 6 step taps with RLE with red TB around ankles Added to HEP, see bolded below  Physical Performance: For LTG  assessment:  [facility] PT Assessment - 11/29/24 1118       Ambulation/Gait   Gait velocity 32.8 ft over 12.32 sec = 2.66 ft/sec      Standardized Balance Assessment   Standardized Balance Assessment Five Times Sit to Stand    Five times sit to stand comments  24.56 sec   no UE     Functional Gait  Assessment   Gait assessed  Yes    Gait  Level Surface Walks 20 ft in less than 7 sec but greater than 5.5 sec, uses assistive device, slower speed, mild gait deviations, or deviates 6-10 in outside of the 12 in walkway width.    Change in Gait Speed Makes only minor adjustments to walking speed, or accomplishes a change in speed with significant gait deviations, deviates 10-15 in outside the 12 in walkway width, or changes speed but loses balance but is able to recover and continue walking.    Gait with Horizontal Head Turns Performs head turns with moderate changes in gait velocity, slows down, deviates 10-15 in outside 12 in walkway width but recovers, can continue to walk.    Gait with Vertical Head Turns Performs task with slight change in gait velocity (eg, minor disruption to smooth gait path), deviates 6 - 10 in outside 12 in walkway width or uses assistive device    Gait and Pivot Turn Pivot turns safely in greater than 3 sec and stops with no loss of balance, or pivot turns safely within 3 sec and stops with mild imbalance, requires small steps to catch balance.    Step Over Obstacle Is able to step over one shoe box (4.5 in total height) but must slow down and adjust steps to clear box safely. May require verbal cueing.    Gait with Narrow Base of Support Ambulates less than 4 steps heel to toe or cannot perform without assistance.    Gait with Eyes Closed Walks 20 ft, uses assistive device, slower speed, mild gait deviations, deviates 6-10 in outside 12 in walkway width. Ambulates 20 ft in less than 9 sec but greater than 7 sec.    Ambulating Backwards Walks 20 ft, uses assistive device, slower  speed, mild gait deviations, deviates 6-10 in outside 12 in walkway width.    Steps Alternating feet, must use rail.    Total Score 15    FGA comment: 15/30, high fall risk           PATIENT EDUCATION: Education details: continue HEP, added to HEP, results of OM and functional implications, plan to complete remaining 5 scheduled visits then d/c  Person educated: Patient Education method: Explanation, Demonstration, Tactile cues, and Verbal cues Education comprehension: verbalized understanding, returned demonstration, and needs further education  HOME EXERCISE PROGRAM: Access Code: [email or web address] URL: [email or web address] Date: 09/29/2024 Prepared by: Waddell Southgate  Exercises - Seated Hamstring Stretch  - 1 x daily - 7 x weekly - 1 sets - 3-5 reps - 30-60 sec hold - Seated Gastroc Stretch with Strap  - 1 x daily - 7 x weekly - 1 sets - 3-5 reps - 30-60 sec hold  - Supine Posterior Pelvic Tilt  - 1 x daily - 7 x weekly - 3 sets - 10 reps - Supine Lower Trunk Rotation  - 1 x daily - 7 x weekly - 1 sets - 3-5 reps - 30 sec hold - Supine Bridge  - 1 x daily - 7 x weekly - 3 sets - 10 reps - 5 sec hold - Supine March  - 1 x daily - 7 x weekly - 3 sets - 10 reps - Supine Single Knee to Chest Stretch  - 1 x daily - 7 x weekly - 1 sets - 3-5 reps - 30-60 hold - Modified Thomas Stretch  - 1 x daily - 7 x weekly - 1 sets - 3-5 reps - 30-60 sec hold -Tandem walking at counter and tandem holds - pt politely declined  printout - Side Stepping with Resistance at Thighs  - 1 x daily - 7 x weekly - 3 sets - 10 reps - Forward Backward Monster Walk with Band at Thighs and Counter Support  - 1 x daily - 7 x weekly - 3 sets - 10 reps - Staggered Sit-to-Stand  - 1 x daily - 7 x weekly - 3 sets - 10 reps - Mini Squat with Counter Support  - 1 x daily - 7 x weekly - 3 sets - 10 reps - Alternating Eccentric Step Downs - Alternating Step Taps with Counter Support  - 1 x daily - 7 x weekly - 3  sets - 10 reps   GOALS: Goals reviewed with patient? Yes  SHORT TERM GOALS: Target date: 10/21/2024   Pt will be independent with initial HEP for improved strength, balance, transfers and gait. Baseline:  pt ind and completing up to 4x/day everyday (11/11) Goal status: MET  2.  Pt will improve gait velocity to at least 3.0 ft/sec for improved gait efficiency and performance at mod I level  Baseline: 2.69 ft/sec mod I (10/15); 3.13 ft/sec no AD SBA (11/11) Goal status: IN PROGRESS  3.  Pt will improve 5 x STS to less than or equal to 30 seconds to demonstrate improved functional strength and transfer efficiency.  Baseline: 34.72 sec LE bracing against chair (10/15); 24.69 sec no UE support (11/11) Goal status: MET  4.  Pt will improve FGA score to >/=22/30 in order to demonstrate improved balance and decreased fall risk. Baseline: 18/30 (10/22); 16/30 (11/11) Goal status: NOT MET  5.  Pt will trial SPC and RW to determine if she can benefit from an AD Baseline: order for rollator (11/11) Goal status: IN PROGRESS   LONG TERM GOALS: Target date: 11/24/2024 (updated to match end of cert date)   Pt will be independent with final HEP for improved strength, balance, transfers and gait. Baseline:  Goal status: IN PROGRESS  2.  Pt will improve gait velocity to at least 3.25 ft/sec for improved gait efficiency and performance at mod I level  Baseline: 2.69 ft/sec mod I (10/15), 2.66 ft/sec mod I with rollator (12/15) Goal status: IN PROGRESS  3.  Pt will improve 5 x STS to less than or equal to 26 seconds to demonstrate improved functional strength and transfer efficiency.  Baseline: 34.72 sec LE bracing against chair (10/15), 24.56 sec no UE (12/15) Goal status: MET  4.  Pt will improve FGA score to >/=20/30 in order to demonstrate improved balance and decreased fall risk. Baseline: 18/30 (10/22); 16/30 (11/11), 15/30 (12/15) Goal status: NOT MET  5.  Pt will obtain appropriate  LRAD to increase her safety and independence with functional mobility and decrease her fall risk Baseline: obtained rollator (11/26) Goal status: MET   NEW SHORT TERM GOALS=LONG TERM GOALS due to length of POC   NEW LONG TERM GOALS:  Target date: 12/15/2024 (to match last scheduled appt within POC)   Pt will be independent with final HEP for improved strength, balance, transfers and gait. Baseline:  Goal status: IN PROGRESS  Pt will improve gait velocity to at least 3.0 ft/sec for improved gait efficiency and performance at mod I level  Baseline: 2.69 ft/sec mod I (10/15), 2.66 ft/sec mod I with rollator (12/15) Goal status: REVISED/DOWNGRADED  Pt will improve FGA score to >/=20/30 in order to demonstrate improved balance and decreased fall risk. Baseline: 18/30 (10/22); 16/30 (11/11), 15/30 (12/15) Goal status: IN  PROGRESS      ASSESSMENT:  CLINICAL IMPRESSION: Emphasis of skilled PT session on assessing LTG and creating new LTG for recertification of PT services this date to cover remaining scheduled visits. Pt has met 2/5 LTG due to improving her 5xSTS score as compared to initial evaluation and obtaining a rollator for increased safety and independence with her functional mobility. She is making progress towards remaining 3/5 LTG though her scores did not change much on her gait speed or her FGA since initial assessment and if anything she scored a little worse than previous assessments. However, she has been unable to attend PT for the past few weeks due to family issues and increased stress in her life. She can benefit from completing her remaining scheduled PT visits and reassessing her progress again at that point. She continues to be a high fall risk based on her gait speed and her FGA score as well as LLE impairments. She continues to benefit from skilled PT services to work towards increased safety and independence with functional mobility and improving her overall strength  and balance. Continue POC.   OBJECTIVE IMPAIRMENTS: Abnormal gait, decreased balance, decreased coordination, decreased knowledge of use of DME, decreased mobility, difficulty walking, decreased ROM, decreased strength, increased fascial restrictions, increased muscle spasms, impaired flexibility, impaired sensation, improper body mechanics, postural dysfunction, and pain.   ACTIVITY LIMITATIONS: carrying, lifting, bending, standing, squatting, stairs, transfers, bed mobility, and locomotion level  PARTICIPATION LIMITATIONS: meal prep, cleaning, laundry, driving, community activity, occupation, and church  PERSONAL FACTORS: Time since onset of injury/illness/exacerbation and 1-2 comorbidities:   Cerebral palsy, Chronic pain and chronic opioid management are also affecting patient's functional outcome.   REHAB POTENTIAL: Good  CLINICAL DECISION MAKING: Evolving/moderate complexity  EVALUATION COMPLEXITY: Moderate  PLAN:  PT FREQUENCY: 2x/week + 1-2x/week (recert)  PT DURATION: 6 weeks + 5 visits (recert)  PLANNED INTERVENTIONS: 02835- PT Re-evaluation, 97750- Physical Performance Testing, 97110-Therapeutic exercises, 97530- Therapeutic activity, [id number]- Neuromuscular re-education, 97535- Self Care, [id number]- Manual therapy, [id number]- Gait training, [phone]- Orthotic Initial, [id number]- Orthotic/Prosthetic subsequent, [id number]- Aquatic Therapy, [phone]- Splinting, [id number]- Electrical stimulation (manual), [phone] (1-2 muscles), [id number] (3+ muscles)- Dry Needling, Patient/Family education, Balance training, Stair training, Taping, Joint mobilization, Spinal mobilization, DME instructions, Cryotherapy, and Moist heat  PLAN FOR NEXT SESSION: Has she tried anything new at the gym? how is HEP?  Pt enjoys [employer].  Hip strength.  Spanish squats.  Lateral/forward step taps to top of stairs.  Anything to increase WB or NMR on LLE (SLS, staggered stance on step, etc), tall kneel/half kneel?      Delois Silvester, PT Waddell Southgate, PT, DPT, CSRS     11/29/2024, 3:38 PM

## 2024-12-01 ENCOUNTER — Ambulatory Visit: Payer: Self-pay | Admitting: Physical Therapy

## 2024-12-01 NOTE — Therapy (Incomplete)
 OUTPATIENT PHYSICAL THERAPY NEURO TREATMENT   Patient Name: Gina Riley MRN: [id number] DOB:1986-03-07, 38 y.o., female Today's Date: 12/01/2024   PCP: Ottie Dorthea KIDD, PA-C (at [facility]) REFERRING PROVIDER: Ottie Dorthea KIDD [name]  END OF SESSION:         Past Medical History:  Diagnosis Date   Cerebral palsy [facility])    Past Surgical History:  Procedure Laterality Date   rhyzotomy for CP on spine so she could walk.  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  Rationale for Evaluation and Treatment: Rehabilitation  SUBJECTIVE:                                                                                                                                                                                             SUBJECTIVE STATEMENT:  Pt apologizes that she missed some visits due to dealing with family issues, she is stressed right now. Her BP was low during last PT visit but was high at the doctor, they told her to monitor her stress levels. Pt has also been feeling dizzy at times. She is still waiting to get a referral to a pain management clinic. She has also seen her orthopedic doctor since last visit, he is no longer recommending surgery as she is making progress with therapy.  She continues to work on her HEP at home. She wants to continue to focus on LLE strengthening and balance.  ***  Pt accompanied by: self, does not drive (takes [employer] transportation, otherwise husband drives her) - husband brought her today  PERTINENT HISTORY: PMH: Cerebral palsy, Chronic pain and chronic opioid management   PAIN:  Are you having pain? Yes: NPRS scale: 5/10 Pain location: feet and knees Pain  description: achy Aggravating factors: prolonged standing Relieving factors: medication, getting up and moving around  PRECAUTIONS: Fall  RED FLAGS: Bowel or bladder incontinence: Yes: pt has noted an increased frequency in her urination that started about a month ago  WEIGHT BEARING RESTRICTIONS: No  FALLS: Has patient fallen in last 6 months? Yes. Number of falls last fall was in her living room and L knee gave out on her, able to get back up herself; 5 falls in the last 6 months  LIVING ENVIRONMENT: Lives with: lives with their family (husband, 61 yo son) Lives  in: House/apartment Stairs: No; one level apartment Has following equipment at home: None  PLOF: Independent with gait and Independent with transfers  PATIENT GOALS: to get stronger to have a regimen for my exercise on the daily  OBJECTIVE:  Note: Objective measures were completed at Evaluation unless otherwise noted.  DIAGNOSTIC FINDINGS: pending B knee xrays  COGNITION: Overall cognitive status: Within functional limits for tasks assessed   SENSATION: N/T in BLE and in her feet  EDEMA:  Gets swelling in feet L>R  MUSCLE LENGTH: Hamstrings: tight bilaterally Heel cords: tight bilaterally   POSTURE: rounded shoulders, forward head, and posterior pelvic tilt  LOWER EXTREMITY ROM:     Active  Right Eval Left Eval  Hip flexion  Tight/decreased  Hip extension    Hip abduction    Hip adduction    Hip internal rotation    Hip external rotation    Knee flexion    Knee extension Tight HS Tight HS  Ankle dorsiflexion    Ankle plantarflexion    Ankle inversion    Ankle eversion     (Blank rows = not tested)  LOWER EXTREMITY MMT:    MMT Right Eval Left Eval  Hip flexion 5 4  Hip extension    Hip abduction    Hip adduction    Hip internal rotation    Hip external rotation    Knee flexion 5 4  Knee extension 5 4  Ankle dorsiflexion 5 2-  Ankle plantarflexion    Ankle inversion    Ankle  eversion    (Blank rows = not tested)  BED MOBILITY:  Mod I per patient report  TRANSFERS: Sit to stand: Modified independence  Assistive device utilized: None     Stand to sit: Modified independence  Assistive device utilized: None     Chair to chair: Modified independence  Assistive device utilized: None       Pt tends to lock out B knees prior to fully standing  RAMP:  Not tested  CURB:  Not tested  STAIRS: Not tested GAIT: Findings:  Gait pattern: increased trunk and pelvic rotation, decreased arm swing- Right, decreased arm swing- Left, and decreased hip/knee flexion- Left Distance walked: various clinic distances Assistive device utilized: None Level of assistance: Modified independence   FUNCTIONAL TESTS:                                                                                                                                     TREATMENT DATE:  12/01/2024  TherAct: Discussed back pillow for support and step stool under feet while seated on couch To work on BLE strengthening: Alt L/R resisted 6 step taps with RLE with red TB around ankles Added to HEP, see bolded below ***      PATIENT EDUCATION: Education details: continue HEP, added to HEP, results of OM and functional implications, plan to complete remaining 5 scheduled visits then  d/c *** Person educated: Patient Education method: Explanation, Demonstration, Tactile cues, and Verbal cues Education comprehension: verbalized understanding, returned demonstration, and needs further education  HOME EXERCISE PROGRAM: Access Code: [email or web address] URL: [email or web address] Date: 09/29/2024 Prepared by: Waddell Southgate  Exercises - Seated Hamstring Stretch  - 1 x daily - 7 x weekly - 1 sets - 3-5 reps - 30-60 sec hold - Seated Gastroc Stretch with Strap  - 1 x daily - 7 x weekly - 1 sets - 3-5 reps - 30-60 sec hold  - Supine Posterior Pelvic Tilt  - 1 x daily - 7 x weekly - 3 sets -  10 reps - Supine Lower Trunk Rotation  - 1 x daily - 7 x weekly - 1 sets - 3-5 reps - 30 sec hold - Supine Bridge  - 1 x daily - 7 x weekly - 3 sets - 10 reps - 5 sec hold - Supine March  - 1 x daily - 7 x weekly - 3 sets - 10 reps - Supine Single Knee to Chest Stretch  - 1 x daily - 7 x weekly - 1 sets - 3-5 reps - 30-60 hold - Modified Thomas Stretch  - 1 x daily - 7 x weekly - 1 sets - 3-5 reps - 30-60 sec hold -Tandem walking at counter and tandem holds - pt politely declined printout - Side Stepping with Resistance at Thighs  - 1 x daily - 7 x weekly - 3 sets - 10 reps - Forward Backward Monster Walk with Band at [employer] and Counter Support  - 1 x daily - 7 x weekly - 3 sets - 10 reps - Staggered Sit-to-Stand  - 1 x daily - 7 x weekly - 3 sets - 10 reps - Mini Squat with Counter Support  - 1 x daily - 7 x weekly - 3 sets - 10 reps - Alternating Eccentric Step Downs - Alternating Step Taps with Counter Support  - 1 x daily - 7 x weekly - 3 sets - 10 reps   GOALS: Goals reviewed with patient? Yes  SHORT TERM GOALS: Target date: 10/21/2024   Pt will be independent with initial HEP for improved strength, balance, transfers and gait. Baseline:  pt ind and completing up to 4x/day everyday (11/11) Goal status: MET  2.  Pt will improve gait velocity to at least 3.0 ft/sec for improved gait efficiency and performance at mod I level  Baseline: 2.69 ft/sec mod I (10/15); 3.13 ft/sec no AD SBA (11/11) Goal status: IN PROGRESS  3.  Pt will improve 5 x STS to less than or equal to 30 seconds to demonstrate improved functional strength and transfer efficiency.  Baseline: 34.72 sec LE bracing against chair (10/15); 24.69 sec no UE support (11/11) Goal status: MET  4.  Pt will improve FGA score to >/=22/30 in order to demonstrate improved balance and decreased fall risk. Baseline: 18/30 (10/22); 16/30 (11/11) Goal status: NOT MET  5.  Pt will trial SPC and RW to determine if she can benefit  from an AD Baseline: order for rollator (11/11) Goal status: IN PROGRESS   LONG TERM GOALS: Target date: 11/24/2024 (updated to match end of cert date)   Pt will be independent with final HEP for improved strength, balance, transfers and gait. Baseline:  Goal status: IN PROGRESS  2.  Pt will improve gait velocity to at least 3.25 ft/sec for improved gait efficiency and performance at mod I level  Baseline: 2.69 ft/sec mod  I (10/15), 2.66 ft/sec mod I with rollator (12/15) Goal status: IN PROGRESS  3.  Pt will improve 5 x STS to less than or equal to 26 seconds to demonstrate improved functional strength and transfer efficiency.  Baseline: 34.72 sec LE bracing against chair (10/15), 24.56 sec no UE (12/15) Goal status: MET  4.  Pt will improve FGA score to >/=20/30 in order to demonstrate improved balance and decreased fall risk. Baseline: 18/30 (10/22); 16/30 (11/11), 15/30 (12/15) Goal status: NOT MET  5.  Pt will obtain appropriate LRAD to increase her safety and independence with functional mobility and decrease her fall risk Baseline: obtained rollator (11/26) Goal status: MET   NEW SHORT TERM GOALS=LONG TERM GOALS due to length of POC   NEW LONG TERM GOALS:  Target date: 12/15/2024 (to match last scheduled appt within POC)   Pt will be independent with final HEP for improved strength, balance, transfers and gait. Baseline:  Goal status: IN PROGRESS  Pt will improve gait velocity to at least 3.0 ft/sec for improved gait efficiency and performance at mod I level  Baseline: 2.69 ft/sec mod I (10/15), 2.66 ft/sec mod I with rollator (12/15) Goal status: REVISED/DOWNGRADED  Pt will improve FGA score to >/=20/30 in order to demonstrate improved balance and decreased fall risk. Baseline: 18/30 (10/22); 16/30 (11/11), 15/30 (12/15) Goal status: IN PROGRESS      ASSESSMENT:  CLINICAL IMPRESSION: Emphasis of skilled PT session on*** assessing LTG and creating new LTG  for recertification of PT services this date to cover remaining scheduled visits. Pt has met 2/5 LTG due to improving her 5xSTS score as compared to initial evaluation and obtaining a rollator for increased safety and independence with her functional mobility. She is making progress towards remaining 3/5 LTG though her scores did not change much on her gait speed or her FGA since initial assessment and if anything she scored a little worse than previous assessments. However, she has been unable to attend PT for the past few weeks due to family issues and increased stress in her life. She can benefit from completing her remaining scheduled PT visits and reassessing her progress again at that point. She continues to be a high fall risk based on her gait speed and her FGA score as well as LLE impairments. She continues to benefit from skilled PT services to work towards increased safety and independence with functional mobility and improving her overall strength and balance. Continue POC.   OBJECTIVE IMPAIRMENTS: Abnormal gait, decreased balance, decreased coordination, decreased knowledge of use of DME, decreased mobility, difficulty walking, decreased ROM, decreased strength, increased fascial restrictions, increased muscle spasms, impaired flexibility, impaired sensation, improper body mechanics, postural dysfunction, and pain.   ACTIVITY LIMITATIONS: carrying, lifting, bending, standing, squatting, stairs, transfers, bed mobility, and locomotion level  PARTICIPATION LIMITATIONS: meal prep, cleaning, laundry, driving, community activity, occupation, and church  PERSONAL FACTORS: Time since onset of injury/illness/exacerbation and 1-2 comorbidities:   Cerebral palsy, Chronic pain and chronic opioid management are also affecting patient's functional outcome.   REHAB POTENTIAL: Good  CLINICAL DECISION MAKING: Evolving/moderate complexity  EVALUATION COMPLEXITY: Moderate  PLAN:  PT FREQUENCY: 2x/week  + 1-2x/week (recert)  PT DURATION: 6 weeks + 5 visits (recert)  PLANNED INTERVENTIONS: 02835- PT Re-evaluation, 97750- Physical Performance Testing, 97110-Therapeutic exercises, 97530- Therapeutic activity, [id number]- Neuromuscular re-education, 97535- Self Care, [id number]- Manual therapy, [id number]- Gait training, [id number]- Orthotic Initial, [id number]- Orthotic/Prosthetic subsequent, [id number]- Aquatic Therapy, [id number]- Splinting, [id number]- Electrical stimulation (manual), [id number] (1-2 muscles), [id number] (  3+ muscles)- Dry Needling, Patient/Family education, Balance training, Stair training, Taping, Joint mobilization, Spinal mobilization, DME instructions, Cryotherapy, and Moist heat  PLAN FOR NEXT SESSION: Has she tried anything new at the gym? how is HEP?  Pt enjoys [employer].  Hip strength.  Spanish squats.  Lateral/forward step taps to top of stairs.  Anything to increase WB or NMR on LLE (SLS, staggered stance on step, etc), tall kneel/half kneel?  ***    Waddell Southgate, PT Waddell Southgate, PT, DPT, CSRS     12/01/2024, 7:36 AM

## 2024-12-06 ENCOUNTER — Ambulatory Visit: Payer: Self-pay | Admitting: Physical Therapy

## 2024-12-08 ENCOUNTER — Ambulatory Visit: Payer: Self-pay | Admitting: Physical Therapy

## 2024-12-08 VITALS — BP 112/73 | HR 65

## 2024-12-08 DIAGNOSIS — Z9181 History of falling: Secondary | ICD-10-CM

## 2024-12-08 DIAGNOSIS — M6281 Muscle weakness (generalized): Secondary | ICD-10-CM | POA: Diagnosis not present

## 2024-12-08 DIAGNOSIS — R2689 Other abnormalities of gait and mobility: Secondary | ICD-10-CM

## 2024-12-08 DIAGNOSIS — M79605 Pain in left leg: Secondary | ICD-10-CM

## 2024-12-08 DIAGNOSIS — R2681 Unsteadiness on feet: Secondary | ICD-10-CM

## 2024-12-08 NOTE — Therapy (Signed)
 " OUTPATIENT PHYSICAL THERAPY NEURO TREATMENT - 10th VISIT PROGRESS NOTE   Patient Name: Gina Riley MRN: [id number] DOB:1986-03-19, 38 y.o., female Today's Date: 12/08/2024   PCP: Ottie Dorthea [name] (at [facility]) REFERRING PROVIDER: Ottie Dorthea MALVA, PA-C  Physical Therapy Progress Note   Dates of Reporting Period: 09/29/2024 - 12/08/2024  See Note below for Objective Data and Assessment of Progress/Goals.  Thank you for the referral of this patient. Waddell Southgate, PT, DPT, CSRS   END OF SESSION:  PT End of Session - 12/08/24 1019     Visit Number 10    Number of Visits 14   recert   Date for Recertification  12/27/24   recert, to allow for scheduling delays   Authorization Type [employer] Dual    PT Start Time 1015    PT Stop Time 1055    PT Time Calculation (min) 40 min    Equipment Utilized During Treatment Gait belt    Activity Tolerance Patient tolerated treatment well    Behavior During Therapy WFL for tasks assessed/performed                Past Medical History:  Diagnosis Date   Cerebral palsy (HCC)    Past Surgical History:  Procedure Laterality Date   rhyzotomy for CP on spine so she could walk.  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   Patient Active Problem List   Diagnosis Date Noted   Abnormal cervical Papanicolaou smear 11/23/2024    ONSET DATE: 09/07/2024  REFERRING DIAG: G80.9 (ICD-10-CM) - Congenital cerebral palsy (HCC)  THERAPY DIAG:  Muscle weakness (generalized)  Other abnormalities of gait and mobility  Unsteadiness on feet  History of falling  Pain in left leg  Rationale for Evaluation and Treatment: Rehabilitation  SUBJECTIVE:                                                                                                                                                                                             SUBJECTIVE STATEMENT:  Pt had a horrible headache yesterday,  had to lay down, had no energy, pounding headache - didn't really feel all the way better until this morning. Asking to have her BP assessed.  Pt also was walking in heels at church on Sunday and stepped wrong, her leg landed on its side out of the shoe, is tender all along outside of her R leg. Asking to have this assessed as well.  Pt accompanied by: self, does not drive (takes [employer] transportation, otherwise husband drives  her) - husband brought her today  PERTINENT HISTORY: PMH: Cerebral palsy, Chronic pain and chronic opioid management   PAIN:  Are you having pain? Yes: NPRS scale: 5/10 Pain location: feet and knees Pain description: achy Aggravating factors: prolonged standing Relieving factors: medication, getting up and moving around  PRECAUTIONS: Fall  RED FLAGS: Bowel or bladder incontinence: Yes: pt has noted an increased frequency in her urination that started about a month ago  WEIGHT BEARING RESTRICTIONS: No  FALLS: Has patient fallen in last 6 months? Yes. Number of falls last fall was in her living room and L knee gave out on her, able to get back up herself; 5 falls in the last 6 months  LIVING ENVIRONMENT: Lives with: lives with their family (husband, 70 yo son) Lives in: House/apartment Stairs: No; one level apartment Has following equipment at home: None  PLOF: Independent with gait and Independent with transfers  PATIENT GOALS: to get stronger to have a regimen for my exercise on the daily  OBJECTIVE:  Note: Objective measures were completed at Evaluation unless otherwise noted.  DIAGNOSTIC FINDINGS: pending B knee xrays  COGNITION: Overall cognitive status: Within functional limits for tasks assessed   SENSATION: N/T in BLE and in her feet  EDEMA:  Gets swelling in feet L>R  MUSCLE LENGTH: Hamstrings: tight bilaterally Heel cords: tight bilaterally   POSTURE: rounded shoulders, forward head, and posterior pelvic tilt  LOWER EXTREMITY  ROM:     Active  Right Eval Left Eval  Hip flexion  Tight/decreased  Hip extension    Hip abduction    Hip adduction    Hip internal rotation    Hip external rotation    Knee flexion    Knee extension Tight HS Tight HS  Ankle dorsiflexion    Ankle plantarflexion    Ankle inversion    Ankle eversion     (Blank rows = not tested)  LOWER EXTREMITY MMT:    MMT Right Eval Left Eval  Hip flexion 5 4  Hip extension    Hip abduction    Hip adduction    Hip internal rotation    Hip external rotation    Knee flexion 5 4  Knee extension 5 4  Ankle dorsiflexion 5 2-  Ankle plantarflexion    Ankle inversion    Ankle eversion    (Blank rows = not tested)  BED MOBILITY:  Mod I per patient report  TRANSFERS: Sit to stand: Modified independence  Assistive device utilized: None     Stand to sit: Modified independence  Assistive device utilized: None     Chair to chair: Modified independence  Assistive device utilized: None       Pt tends to lock out B knees prior to fully standing  RAMP:  Not tested  CURB:  Not tested  STAIRS: Not tested GAIT: Findings:  Gait pattern: increased trunk and pelvic rotation, decreased arm swing- Right, decreased arm swing- Left, and decreased hip/knee flexion- Left Distance walked: various clinic distances Assistive device utilized: None Level of assistance: Modified independence   FUNCTIONAL TESTS:  TREATMENT DATE:  12/08/2024  TherAct: To assess RLE per patient request: TTP in R gastroc and along lateral portion of her distal RLE Pain with PF overpressure, no pain with DF, inversion, or eversion Mild swelling in R calf as compared to L calf To address RLE mild calf strain (potentially): Seated gastroc stretch with strap 3 x 30 sec Standing with BUE support heel raises 3 x 10 reps Gait x 40 ft  in // bars in between sets SciFit constant resistance level 4 for 8 minutes using BUE/BLEs for neural priming for reciprocal movement, dynamic cardiovascular warmup and increased amplitude of stepping. RPE of 9/10 following activity.  In // bars with BUE support: Lateral side-stepping 3 x 10 ft L/R    PATIENT EDUCATION: Education details: continue HEP and stretching her R calf Person educated: Patient Education method: Explanation, Demonstration, Tactile cues, and Verbal cues Education comprehension: verbalized understanding, returned demonstration, and needs further education  HOME EXERCISE PROGRAM: Access Code: [email or web address] URL: [email or web address] Date: 09/29/2024 Prepared by: Waddell Southgate  Exercises - Seated Hamstring Stretch  - 1 x daily - 7 x weekly - 1 sets - 3-5 reps - 30-60 sec hold - Seated Gastroc Stretch with Strap  - 1 x daily - 7 x weekly - 1 sets - 3-5 reps - 30-60 sec hold  - Supine Posterior Pelvic Tilt  - 1 x daily - 7 x weekly - 3 sets - 10 reps - Supine Lower Trunk Rotation  - 1 x daily - 7 x weekly - 1 sets - 3-5 reps - 30 sec hold - Supine Bridge  - 1 x daily - 7 x weekly - 3 sets - 10 reps - 5 sec hold - Supine Gina  - 1 x daily - 7 x weekly - 3 sets - 10 reps - Supine Single Knee to Chest Stretch  - 1 x daily - 7 x weekly - 1 sets - 3-5 reps - 30-60 hold - Modified Thomas Stretch  - 1 x daily - 7 x weekly - 1 sets - 3-5 reps - 30-60 sec hold -Tandem walking at counter and tandem holds - pt politely declined printout - Side Stepping with Resistance at Thighs  - 1 x daily - 7 x weekly - 3 sets - 10 reps - Forward Backward Monster Walk with Band at [employer] and Counter Support  - 1 x daily - 7 x weekly - 3 sets - 10 reps - Staggered Sit-to-Stand  - 1 x daily - 7 x weekly - 3 sets - 10 reps - Mini Squat with Counter Support  - 1 x daily - 7 x weekly - 3 sets - 10 reps - Alternating Eccentric Step Downs - Alternating Step Taps with Counter Support  -  1 x daily - 7 x weekly - 3 sets - 10 reps   GOALS: Goals reviewed with patient? Yes  SHORT TERM GOALS: Target date: 10/21/2024   Pt will be independent with initial HEP for improved strength, balance, transfers and gait. Baseline:  pt ind and completing up to 4x/day everyday (11/11) Goal status: MET  2.  Pt will improve gait velocity to at least 3.0 ft/sec for improved gait efficiency and performance at mod I level  Baseline: 2.69 ft/sec mod I (10/15); 3.13 ft/sec no AD SBA (11/11) Goal status: IN PROGRESS  3.  Pt will improve 5 x STS to less than or equal to 30 seconds to demonstrate improved functional strength and transfer efficiency.  Baseline: 34.72  sec LE bracing against chair (10/15); 24.69 sec no UE support (11/11) Goal status: MET  4.  Pt will improve FGA score to >/=22/30 in order to demonstrate improved balance and decreased fall risk. Baseline: 18/30 (10/22); 16/30 (11/11) Goal status: NOT MET  5.  Pt will trial SPC and RW to determine if she can benefit from an AD Baseline: order for rollator (11/11) Goal status: IN PROGRESS   LONG TERM GOALS: Target date: 11/24/2024 (updated to match end of cert date)   Pt will be independent with final HEP for improved strength, balance, transfers and gait. Baseline:  Goal status: IN PROGRESS  2.  Pt will improve gait velocity to at least 3.25 ft/sec for improved gait efficiency and performance at mod I level  Baseline: 2.69 ft/sec mod I (10/15), 2.66 ft/sec mod I with rollator (12/15) Goal status: IN PROGRESS  3.  Pt will improve 5 x STS to less than or equal to 26 seconds to demonstrate improved functional strength and transfer efficiency.  Baseline: 34.72 sec LE bracing against chair (10/15), 24.56 sec no UE (12/15) Goal status: MET  4.  Pt will improve FGA score to >/=20/30 in order to demonstrate improved balance and decreased fall risk. Baseline: 18/30 (10/22); 16/30 (11/11), 15/30 (12/15) Goal status: NOT MET  5.   Pt will obtain appropriate LRAD to increase her safety and independence with functional mobility and decrease her fall risk Baseline: obtained rollator (11/26) Goal status: MET   NEW SHORT TERM GOALS=LONG TERM GOALS due to length of POC   NEW LONG TERM GOALS:  Target date: 12/15/2024 (to match last scheduled appt within POC)   Pt will be independent with final HEP for improved strength, balance, transfers and gait. Baseline:  Goal status: IN PROGRESS  Pt will improve gait velocity to at least 3.0 ft/sec for improved gait efficiency and performance at mod I level  Baseline: 2.69 ft/sec mod I (10/15), 2.66 ft/sec mod I with rollator (12/15) Goal status: REVISED/DOWNGRADED  Pt will improve FGA score to >/=20/30 in order to demonstrate improved balance and decreased fall risk. Baseline: 18/30 (10/22); 16/30 (11/11), 15/30 (12/15) Goal status: IN PROGRESS      ASSESSMENT:  CLINICAL IMPRESSION: Emphasis of skilled PT session on assessing BP and R calf/LE pain per patient request. Her vitals are WNL. Encouraged her to reach out to her PCP for a prescription for a BP cuff so she can continue to monitor it at home. She likely strained her R calf, encouraged her to continue to stretch it. She continues to benefit from skilled PT services to work towards increased safety and independence with functional mobility and improving her overall strength and balance. Continue POC.   OBJECTIVE IMPAIRMENTS: Abnormal gait, decreased balance, decreased coordination, decreased knowledge of use of DME, decreased mobility, difficulty walking, decreased ROM, decreased strength, increased fascial restrictions, increased muscle spasms, impaired flexibility, impaired sensation, improper body mechanics, postural dysfunction, and pain.   ACTIVITY LIMITATIONS: carrying, lifting, bending, standing, squatting, stairs, transfers, bed mobility, and locomotion level  PARTICIPATION LIMITATIONS: meal prep, cleaning,  laundry, driving, community activity, occupation, and church  PERSONAL FACTORS: Time since onset of injury/illness/exacerbation and 1-2 comorbidities:   Cerebral palsy, Chronic pain and chronic opioid management are also affecting patient's functional outcome.   REHAB POTENTIAL: Good  CLINICAL DECISION MAKING: Evolving/moderate complexity  EVALUATION COMPLEXITY: Moderate  PLAN:  PT FREQUENCY: 2x/week + 1-2x/week (recert)  PT DURATION: 6 weeks + 5 visits (recert)  PLANNED INTERVENTIONS: 02835- PT Re-evaluation,  02249- Physical Performance Testing, 97110-Therapeutic exercises, 97530- Therapeutic activity, [id number]- Neuromuscular re-education, [id number]- Self Care, 02859- Manual therapy, [id number]- Gait training, [phone]- Orthotic Initial, [phone]- Orthotic/Prosthetic subsequent, [phone]- Aquatic Therapy, [phone]- Splinting, [phone]- Electrical stimulation (manual), [phone] (1-2 muscles), [phone] (3+ muscles)- Dry Needling, Patient/Family education, Balance training, Stair training, Taping, Joint mobilization, Spinal mobilization, DME instructions, Cryotherapy, and Moist heat  PLAN FOR NEXT SESSION: Has she tried anything new at the gym? how is HEP?  Pt enjoys SciFit.  Hip strength.  Spanish squats.  Lateral/forward step taps to top of stairs.  Anything to increase WB or NMR on LLE (SLS, staggered stance on step, etc), tall kneel/half kneel?, how is her R calf? Did she reach out to PCP for prescription for BP cuff?    Jayveon Convey, PT Waddell Southgate, PT, DPT, CSRS     12/08/2024, 11:00 AM        "

## 2024-12-10 ENCOUNTER — Ambulatory Visit (HOSPITAL_COMMUNITY)
Admission: EM | Admit: 2024-12-10 | Discharge: 2024-12-10 | Disposition: A | Discharge status: Home / Self Care | Attending: Physician Assistant | Admitting: Physician Assistant

## 2024-12-10 ENCOUNTER — Encounter (HOSPITAL_COMMUNITY): Payer: Self-pay

## 2024-12-10 ENCOUNTER — Ambulatory Visit (HOSPITAL_COMMUNITY)

## 2024-12-10 DIAGNOSIS — M545 Low back pain, unspecified: Secondary | ICD-10-CM

## 2024-12-10 DIAGNOSIS — M542 Cervicalgia: Secondary | ICD-10-CM | POA: Diagnosis not present

## 2024-12-10 DIAGNOSIS — W19XXXA Unspecified fall, initial encounter: Secondary | ICD-10-CM

## 2024-12-10 MED ORDER — CELECOXIB 100 MG PO CAPS: 100.0000 mg | ORAL_CAPSULE | Freq: Two times a day (BID) | ORAL | 0 refills | Status: AC

## 2024-12-10 MED ORDER — HYDROCODONE-ACETAMINOPHEN 5-325 MG PO TABS: 0.5000 | ORAL_TABLET | Freq: Two times a day (BID) | ORAL | 0 refills | Status: AC | PRN
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
## 2024-12-10 NOTE — Discharge Instructions (Addendum)
 I did not see anything on your x-rays but we will contact you if the radiologist sees something that I did not.  I did call and Celebrex  which is an anti-inflammatory to help with your pain.  You can take this twice a day.  Do not take NSAIDs with this medication including aspirin, ibuprofen /Advil , naproxen /Aleve .  I have called in a few doses of hydrocodone .  This medication is addictive and sedating so please try to limit use is much as possible.  We will not be able to provide you a refill of this medicine.  Follow-up with orthopedics for further evaluation and management.  If anything worsens you have increasing pain, weakness in your legs, numbness or tingling in your hands or feet you should go to the emergency room immediately.

## 2024-12-12 ENCOUNTER — Ambulatory Visit (HOSPITAL_COMMUNITY): Payer: Self-pay | Admitting: Physician Assistant

## 2024-12-13 ENCOUNTER — Encounter: Payer: Self-pay | Admitting: Physical Therapy

## 2024-12-13 ENCOUNTER — Ambulatory Visit: Payer: Self-pay | Admitting: Physical Therapy

## 2024-12-13 VITALS — BP 131/73 | HR 59

## 2024-12-13 DIAGNOSIS — M79605 Pain in left leg: Secondary | ICD-10-CM

## 2024-12-13 DIAGNOSIS — M6281 Muscle weakness (generalized): Secondary | ICD-10-CM | POA: Diagnosis not present

## 2024-12-13 DIAGNOSIS — Z9181 History of falling: Secondary | ICD-10-CM

## 2024-12-13 DIAGNOSIS — R2681 Unsteadiness on feet: Secondary | ICD-10-CM

## 2024-12-13 DIAGNOSIS — R2689 Other abnormalities of gait and mobility: Secondary | ICD-10-CM
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
## 2024-12-15 ENCOUNTER — Ambulatory Visit: Payer: Self-pay | Admitting: Physical Therapy

## 2024-12-15 VITALS — BP 122/89 | HR 67

## 2024-12-15 DIAGNOSIS — R2689 Other abnormalities of gait and mobility: Secondary | ICD-10-CM

## 2024-12-15 DIAGNOSIS — M6281 Muscle weakness (generalized): Secondary | ICD-10-CM | POA: Diagnosis not present

## 2024-12-15 DIAGNOSIS — R2681 Unsteadiness on feet: Secondary | ICD-10-CM

## 2024-12-15 DIAGNOSIS — Z9181 History of falling: Secondary | ICD-10-CM

## 2024-12-15 DIAGNOSIS — M79605 Pain in left leg: Secondary | ICD-10-CM
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
## 2025-03-24 ENCOUNTER — Other Ambulatory Visit (HOSPITAL_BASED_OUTPATIENT_CLINIC_OR_DEPARTMENT_OTHER)
